# Patient Record
Sex: Female | Born: 1935 | Race: White | Hispanic: No | Marital: Married | Smoking: Never smoker
Health system: Southern US, Community
[De-identification: ages and names within clinical notes are randomized; demographics above are authoritative.]

## PROBLEM LIST (undated history)

## (undated) DIAGNOSIS — E039 Hypothyroidism, unspecified: Secondary | ICD-10-CM

## (undated) DIAGNOSIS — R011 Cardiac murmur, unspecified: Secondary | ICD-10-CM

## (undated) DIAGNOSIS — R002 Palpitations: Secondary | ICD-10-CM

## (undated) DIAGNOSIS — R42 Dizziness and giddiness: Secondary | ICD-10-CM

## (undated) DIAGNOSIS — T7840XA Allergy, unspecified, initial encounter: Secondary | ICD-10-CM

## (undated) DIAGNOSIS — I350 Nonrheumatic aortic (valve) stenosis: Secondary | ICD-10-CM

## (undated) DIAGNOSIS — I2510 Atherosclerotic heart disease of native coronary artery without angina pectoris: Secondary | ICD-10-CM

## (undated) DIAGNOSIS — M1990 Unspecified osteoarthritis, unspecified site: Secondary | ICD-10-CM

## (undated) DIAGNOSIS — M810 Age-related osteoporosis without current pathological fracture: Secondary | ICD-10-CM

## (undated) DIAGNOSIS — F419 Anxiety disorder, unspecified: Secondary | ICD-10-CM

## (undated) DIAGNOSIS — F32A Depression, unspecified: Secondary | ICD-10-CM

## (undated) DIAGNOSIS — I499 Cardiac arrhythmia, unspecified: Secondary | ICD-10-CM

## (undated) DIAGNOSIS — Z9289 Personal history of other medical treatment: Secondary | ICD-10-CM

## (undated) HISTORY — PX: OTHER SURGICAL HISTORY: SHX169

## (undated) HISTORY — DX: Age-related osteoporosis without current pathological fracture: M81.0

## (undated) HISTORY — PX: ABDOMINAL HYSTERECTOMY: SHX81

## (undated) HISTORY — PX: APPENDECTOMY: SHX54

## (undated) HISTORY — DX: Palpitations: R00.2

## (undated) HISTORY — DX: Nonrheumatic aortic (valve) stenosis: I35.0

## (undated) HISTORY — DX: Personal history of other medical treatment: Z92.89

## (undated) HISTORY — DX: Hypothyroidism, unspecified: E03.9

## (undated) HISTORY — DX: Depression, unspecified: F32.A

## (undated) HISTORY — PX: CARDIAC CATHETERIZATION: SHX172

## (undated) HISTORY — PX: TONSILLECTOMY: SUR1361

## (undated) HISTORY — PX: FINGER SURGERY: SHX640

## (undated) HISTORY — PX: CARDIAC VALVE REPLACEMENT: SHX585

## (undated) HISTORY — DX: Anxiety disorder, unspecified: F41.9

## (undated) HISTORY — DX: Allergy, unspecified, initial encounter: T78.40XA

---

## 1998-02-09 ENCOUNTER — Other Ambulatory Visit: Admission: RE | Admit: 1998-02-09 | Discharge: 1998-02-09 | Payer: Self-pay | Admitting: Obstetrics & Gynecology

## 2002-12-23 ENCOUNTER — Encounter: Payer: Self-pay | Admitting: Internal Medicine

## 2002-12-23 ENCOUNTER — Encounter: Admission: RE | Admit: 2002-12-23 | Discharge: 2002-12-23 | Payer: Self-pay | Admitting: Internal Medicine

## 2007-12-18 ENCOUNTER — Encounter: Admission: RE | Admit: 2007-12-18 | Discharge: 2007-12-18 | Payer: Self-pay | Admitting: Orthopedic Surgery

## 2007-12-20 ENCOUNTER — Ambulatory Visit (HOSPITAL_BASED_OUTPATIENT_CLINIC_OR_DEPARTMENT_OTHER): Admission: RE | Admit: 2007-12-20 | Discharge: 2007-12-20 | Payer: Self-pay | Admitting: Orthopedic Surgery

## 2007-12-20 ENCOUNTER — Encounter (INDEPENDENT_AMBULATORY_CARE_PROVIDER_SITE_OTHER): Payer: Self-pay | Admitting: Orthopedic Surgery

## 2009-02-23 ENCOUNTER — Ambulatory Visit: Payer: Self-pay | Admitting: Cardiology

## 2009-02-23 DIAGNOSIS — R002 Palpitations: Secondary | ICD-10-CM | POA: Insufficient documentation

## 2009-02-23 DIAGNOSIS — E039 Hypothyroidism, unspecified: Secondary | ICD-10-CM | POA: Insufficient documentation

## 2009-02-23 DIAGNOSIS — R0602 Shortness of breath: Secondary | ICD-10-CM | POA: Insufficient documentation

## 2009-03-10 ENCOUNTER — Ambulatory Visit: Payer: Self-pay | Admitting: Cardiology

## 2009-03-10 ENCOUNTER — Encounter: Payer: Self-pay | Admitting: Cardiology

## 2009-03-10 ENCOUNTER — Ambulatory Visit: Payer: Self-pay

## 2009-03-10 ENCOUNTER — Ambulatory Visit (HOSPITAL_COMMUNITY): Admission: RE | Admit: 2009-03-10 | Discharge: 2009-03-10 | Payer: Self-pay | Admitting: Cardiology

## 2009-03-20 ENCOUNTER — Telehealth (INDEPENDENT_AMBULATORY_CARE_PROVIDER_SITE_OTHER): Payer: Self-pay | Admitting: *Deleted

## 2009-04-16 ENCOUNTER — Ambulatory Visit: Payer: Self-pay | Admitting: Cardiology

## 2009-04-16 DIAGNOSIS — I359 Nonrheumatic aortic valve disorder, unspecified: Secondary | ICD-10-CM | POA: Insufficient documentation

## 2010-03-30 ENCOUNTER — Encounter: Payer: Self-pay | Admitting: Cardiology

## 2010-03-30 ENCOUNTER — Ambulatory Visit: Payer: Self-pay | Admitting: Cardiology

## 2010-04-09 ENCOUNTER — Ambulatory Visit: Payer: Self-pay

## 2010-04-09 ENCOUNTER — Ambulatory Visit (HOSPITAL_COMMUNITY): Admission: RE | Admit: 2010-04-09 | Payer: Self-pay | Source: Home / Self Care | Admitting: Cardiology

## 2010-04-20 ENCOUNTER — Ambulatory Visit (HOSPITAL_COMMUNITY)
Admission: RE | Admit: 2010-04-20 | Discharge: 2010-04-20 | Payer: Self-pay | Source: Home / Self Care | Attending: Cardiology | Admitting: Cardiology

## 2010-04-20 ENCOUNTER — Encounter: Payer: Self-pay | Admitting: Cardiology

## 2010-04-20 ENCOUNTER — Ambulatory Visit: Payer: Self-pay

## 2010-05-27 NOTE — Assessment & Plan Note (Signed)
Summary: F1Y/DM   CC:  1 year ROV.  History of Present Illness: Pleasant female who I saw in November, 2010 for palpitations. An echocardiogram was performed on November 16 of 2010 and showed normal LV function and mild aortic stenosis with a mean gradient of 12 mm of mercury. An event monitor showed sinus rhythm with rare PAC and PVC but she did not have palpitations while wearing the monitor. Since I last saw her in Dec 2010 she has rare palpitations that are described as a brief flutter. They are not sustained. She did not have dyspnea on exertion, chest pain, syncope or pedal edema.  Current Medications (verified): 1)  Omega-3 350 Mg Caps (Omega-3 Fatty Acids) .Marland Kitchen.. 1 Tab By Mouth Two Times A Day 2)  Calcium-Magnesium 500-250 Mg Tabs (Calcium-Magnesium) .Marland Kitchen.. 1 Tab By Mouth Two Times A Day 3)  Glucosamine-Chondroitin   Caps (Glucosamine-Chondroit-Vit C-Mn) .Marland Kitchen.. 1 Tab By Mouth Two Times A Day 4)  Estradiol 0.5 Mg Tabs (Estradiol) .Marland Kitchen.. 1 Tab By Mouth Once Daily 5)  Aspirin 81 Mg  Tabs (Aspirin) .Marland Kitchen.. 1 Tab By Mouth Once Daily 6)  Flaxseed  Misc (Flaxseed) .... Once Daily 7)  Vitamin C  Powd (Ascorbic Acid) .... Daily 8)  Cinnamon Oil  Powder .... Daily 9)  Folic Acid 400 Mcg Tabs (Folic Acid) .Marland Kitchen.. 1 Tab By Mouth Once Daily 10)  Vitamin D 1000 Unit Tabs (Cholecalciferol) .Marland Kitchen.. 1 Tab By Mouth Once Daily 11)  Synthroid 25 Mcg Tabs (Levothyroxine Sodium) .Marland Kitchen.. 1 Tab By Mouth Once Daily 12)  Vitamin E 400 Unit Caps (Vitamin E) .... Take 1 By Mouth Once Daily  Allergies (verified): 1)  ! Sulfa 2)  ! Erythromycin  Past History:  Past Medical History: Hypothyroid Mild aortic stenosis  Past Surgical History: Reviewed history from 02/23/2009 and no changes required. Bilateral arthroscopic knee surgery Prior TAH secondary to fibroids Tonsillectomy Appendecomy  Social History: Reviewed history from 02/23/2009 and no changes required. Retired  Married  Tobacco Use - No.  Alcohol Use -  yes  Review of Systems       no fevers or chills, productive cough, hemoptysis, dysphasia, odynophagia, melena, hematochezia, dysuria, hematuria, rash, seizure activity, orthopnea, PND, pedal edema, claudication. Remaining systems are negative.   Vital Signs:  Patient profile:   75 year old female Height:      63.5 inches Weight:      132 pounds BMI:     23.10 Pulse rate:   72 / minute Pulse rhythm:   regular BP sitting:   122 / 70  (left arm) Cuff size:   regular  Vitals Entered By: [facility] Kidney, EMT-P (March 30, 2010 11:41 AM)  Physical Exam  General:  Well-developed well-nourished in no acute distress.  Skin is warm and dry.  HEENT is normal.  Neck is supple. No thyromegaly.  Chest is clear to auscultation with normal expansion.  Cardiovascular exam is regular rate and rhythm.  Abdominal exam nontender or distended. No masses palpated. Extremities show no edema. neuro grossly intact    Impression & Recommendations:  Problem # 1:  AORTIC VALVE DISORDERS (ICD-424.1)  Plan repeat echocardiogram.  Problem # 2:  UNSPECIFIED HYPOTHYROIDISM (ICD-244.9)  Her updated medication list for this problem includes:    Synthroid 25 Mcg Tabs (Levothyroxine sodium) .Marland Kitchen... 1 tab by mouth once daily  Her updated medication list for this problem includes:    Synthroid 25 Mcg Tabs (Levothyroxine sodium) .Marland Kitchen... 1 tab by mouth once daily  Problem # 3:  PALPITATIONS (ICD-785.1)  Symptoms sound to most likely be PACs or PVCs. They are self-limited. No further evaluation at this point. If they worsen we will plan to repeat her monitor or add a beta blocker. Her updated medication list for this problem includes:    Aspirin 81 Mg Tabs (Aspirin) .Marland Kitchen... 1 tab by mouth once daily  Other Orders: EKG w/ Interpretation (93000) Echocardiogram (Echo)  Patient Instructions: 1)  Your physician recommends that you schedule a follow-up appointment in: YEAR WITH DR CRENSHAW 2)  Your physician  recommends that you continue on your current medications as directed. Please refer to the Current Medication list given to you today. 3)  Your physician has requested that you have an echocardiogram.  Echocardiography is a painless test that uses sound waves to create images of your heart. It provides your doctor with information about the size and shape of your heart and how well your heart's chambers and valves are working.  This procedure takes approximately one hour. There are no restrictions for this procedure.

## 2010-09-07 NOTE — Op Note (Signed)
NAMEDORIE, OHMS                  ACCOUNT NO.:  [id number]   MEDICAL RECORD NO.:  [id number]          PATIENT TYPE:  AMB   LOCATION:  DSC                          FACILITY:  [facility]   PHYSICIAN:  Cindee Salt, M.D.       DATE OF BIRTH:  16-Mar-1936   DATE OF PROCEDURE:  12/20/2007  DATE OF DISCHARGE:                               OPERATIVE REPORT   PREOPERATIVE DIAGNOSIS:  Mucoid cyst, left middle, left ring fingers.   POSTOPERATIVE DIAGNOSIS:  Mucoid cyst, left middle, left ring fingers.   OPERATION:  Excision of mucoid cyst, left middle, left ring finger with  debridement in distal interphalangeal joints, each finger.   SURGEON:  Cindee Salt, MD   ASSISTANT:  Carolyne Fiscal, RN   ANESTHESIA:  Forearm based IV regional.   ANESTHESIOLOGIST:  Zenon Mayo, MD   HISTORY:  The patient is a 75 year old female with a history of mucoid  cyst of her left middle, left ring finger.  She is desirous of having  these removed.  Her postoperative course had been discussed along with  risks and complications and she is aware that there is no guarantee with  the surgery, possibility of infection, recurrence of injury to arteries,  nerves, tendons, incomplete relief of symptoms, dystrophy, and  possibility of recurrence of the cyst as long as degenerative changes  are present within the joint.  She has elected to undergo surgical  excision.  In the preoperative area the patient is seen, the extremity  marked by both the patient and surgeon, and antibiotic given.   PROCEDURE:  The patient was brought to the operating room where a  forearm based IV regional anesthetic was carried out without difficulty.  She was prepped using DuraPrep, supine position, left arm free.  A  curvilinear incision was made over the distal interphalangeal joint of  the left middle finger and carried down through subcutaneous tissue.  Bleeders were electrocauterized.  The dissection carried down to the  joint.  A cyst was  immediately encountered, both radial and ulnarly.  This had been deflated.  This was excised and sent to pathology.  The  wound was then irrigated.  The joint debrided with a small rongeur.  The  wound was then closed with interrupted 5-0 Vicryl Rapide sutures.  The  ring finger was attended to next.  A curvilinear incision was made with  possible rotation of a dorsal flap.  Again, this was carried down  through the subcutaneous tissue.  Bleeders again were electrocauterized.  The extensor tendon was identified and protected.  A cyst was present on  the radial aspect with blunt and sharp dissection.  This was dissected  free maintaining the skin.  The specimen was sent to pathology as was  the cyst and the middle finger.  The wound was irrigated after debriding  the distal interphalangeal joint with a small rongeur.  The extensor  tendon was maintained intact.  The wound was then irrigated with saline.  The skin was then closed with interrupted 5-0 Vicryl Rapide sutures.  Flap was not performed  and that the skin was able to be  closed without difficulty and no tension.  A sterile compressive  dressing and splint to each finger was applied.  The patient tolerated  the procedure well and was taken to the recovery room for observation in  satisfactory condition.  She will be discharged home to return to the  [facility] [place] in 1 week on [place] [address].           ______________________________  Cindee Salt, M.D.     GK/MEDQ  D:  12/20/2007  T:  12/21/2007  Job:  [id number]   cc:   Olene Craven, M.D.

## 2011-06-23 ENCOUNTER — Telehealth: Payer: Self-pay | Admitting: Cardiology

## 2011-06-23 NOTE — Telephone Encounter (Signed)
Spoke with pt, she is having a physical with dr Wylene Simmer and had some questions regarding labs. Questions answered.

## 2011-06-23 NOTE — Telephone Encounter (Signed)
New Msg: Pt calling wanting to speak with nurse with question about heart test.   Pt was also reminded that she is past due to see MD. Pt was informed that letter was sent in October 2012 to remind that she is due to see MD. Pt stated she wanted to speak with nurse prior to scheduling appt to see MD for yearly fu.  Please return pt call to discuss further.

## 2011-08-24 ENCOUNTER — Encounter: Payer: Self-pay | Admitting: *Deleted

## 2011-08-25 ENCOUNTER — Encounter: Payer: Self-pay | Admitting: Cardiology

## 2011-08-25 ENCOUNTER — Ambulatory Visit (INDEPENDENT_AMBULATORY_CARE_PROVIDER_SITE_OTHER): Payer: Medicare Other | Admitting: Cardiology

## 2011-08-25 VITALS — BP 131/82 | HR 67 | Ht 63.0 in | Wt 131.0 lb

## 2011-08-25 DIAGNOSIS — I359 Nonrheumatic aortic valve disorder, unspecified: Secondary | ICD-10-CM

## 2011-08-25 DIAGNOSIS — R079 Chest pain, unspecified: Secondary | ICD-10-CM | POA: Insufficient documentation

## 2011-08-25 NOTE — Progress Notes (Signed)
   HPI: Pleasant female who I initially saw in November 2010 for palpitations. An echocardiogram was performed on November 16 of 2010 and showed normal LV function and mild aortic stenosis with a mean gradient of 12 mm of mercury. An event monitor showed sinus rhythm with rare PAC and PVC but she did not have palpitations while wearing the monitor. Echocardiogram was repeated in December of 2011. Her LV function was normal. There was moderate aortic stenosis with a mean gradient of 27 mm of mercury. There was mild aortic insufficiency. Since I last saw her in Dec 2011 she has some dyspnea on exertion but this is typically with extreme activities. No orthopnea, PND, pedal edema, palpitations or syncope. She has chest tightness when she runs but not with normal activities.  Current Outpatient Prescriptions  Medication Sig Dispense Refill  . Ascorbic Acid (VITAMIN C) POWD Take by mouth as directed.      Marland Kitchen aspirin 81 MG tablet Take 81 mg by mouth daily.      Marland Kitchen CALCIUM PO Take by mouth 2 (two) times daily.      . Cholecalciferol (VITAMIN D PO) Take by mouth as directed.      Marland Kitchen CINNAMON PO Take by mouth as directed.      Marland Kitchen estradiol (ESTRACE) 0.5 MG tablet Take 0.5 mg by mouth daily.      Marland Kitchen GLUCOSAMINE-CHONDROITIN PO Take by mouth 2 (two) times daily. 1200-1500 mg      . Levothyroxine Sodium (SYNTHROID PO) Take by mouth daily. .05      . MAGNESIUM PO Take by mouth 2 (two) times daily.      . Methylsulfonylmethane (MSM) 1000 MG CAPS Take by mouth 2 (two) times daily.      . Multiple Vitamins-Minerals (BIO-35 IRON FREE PO) Take by mouth. 75 mg      . Omega-3 Fatty Acids (FISH OIL PO) Take by mouth 2 (two) times daily.      Marland Kitchen VITAMIN E PO Take by mouth. 400 IU         Past Medical History  Diagnosis Date  . Unspecified hypothyroidism   . Aortic stenosis   . Palpitations     No past surgical history on file.  History   Social History  . Marital Status: Married    Spouse Name: N/A    Number  of Children: N/A  . Years of Education: N/A   Occupational History  . Not on file.   Social History Main Topics  . Smoking status: Never Smoker   . Smokeless tobacco: Not on file  . Alcohol Use: Not on file  . Drug Use: Not on file  . Sexually Active: Not on file   Other Topics Concern  . Not on file   Social History Narrative  . No narrative on file    ROS: Arthralgias but no fevers or chills, productive cough, hemoptysis, dysphasia, odynophagia, melena, hematochezia, dysuria, hematuria, rash, seizure activity, orthopnea, PND, pedal edema, claudication. Remaining systems are negative.  Physical Exam: Well-developed well-nourished in no acute distress.  Skin is warm and dry.  HEENT is normal.  Neck is supple.  Chest is clear to auscultation with normal expansion.  Cardiovascular exam is regular rate and rhythm. 2/6 systolic murmur left sternal border. S2 is not diminished. Abdominal exam nontender or distended. No masses palpated. Extremities show no edema. neuro grossly intact  ECG normal sinus rhythm at a rate of 67. Left axis deviation.

## 2011-08-25 NOTE — Assessment & Plan Note (Signed)
Plan repeat echocardiogram. I have explained that she will most likely require aortic valve replacement in the future.

## 2011-08-25 NOTE — Assessment & Plan Note (Signed)
Controlled at present.  

## 2011-08-25 NOTE — Assessment & Plan Note (Signed)
Patient has some chest tightness but only with running. Schedule Myoview for risk stratification.

## 2011-08-25 NOTE — Patient Instructions (Signed)
Your physician wants you to follow-up in: ONE YEAR WITH DR CRENSHAW You will receive a reminder letter in the mail two months in advance. If you don't receive a letter, please call our office to schedule the follow-up appointment.   Your physician has requested that you have en exercise stress myoview. For further information please visit [email or web address]. Please follow instruction sheet, as given.   Your physician has requested that you have an echocardiogram. Echocardiography is a painless test that uses sound waves to create images of your heart. It provides your doctor with information about the size and shape of your heart and how well your heart's chambers and valves are working. This procedure takes approximately one hour. There are no restrictions for this procedure.   

## 2011-09-05 ENCOUNTER — Ambulatory Visit (HOSPITAL_COMMUNITY): Payer: Medicare Other | Attending: Cardiology

## 2011-09-05 ENCOUNTER — Other Ambulatory Visit: Payer: Self-pay

## 2011-09-05 DIAGNOSIS — I359 Nonrheumatic aortic valve disorder, unspecified: Secondary | ICD-10-CM

## 2011-09-06 ENCOUNTER — Ambulatory Visit (HOSPITAL_COMMUNITY): Payer: Medicare Other | Attending: Cardiology | Admitting: Radiology

## 2011-09-06 DIAGNOSIS — R0989 Other specified symptoms and signs involving the circulatory and respiratory systems: Secondary | ICD-10-CM | POA: Insufficient documentation

## 2011-09-06 DIAGNOSIS — R002 Palpitations: Secondary | ICD-10-CM | POA: Insufficient documentation

## 2011-09-06 DIAGNOSIS — R079 Chest pain, unspecified: Secondary | ICD-10-CM

## 2011-09-06 DIAGNOSIS — R0789 Other chest pain: Secondary | ICD-10-CM | POA: Insufficient documentation

## 2011-09-06 DIAGNOSIS — R0609 Other forms of dyspnea: Secondary | ICD-10-CM | POA: Insufficient documentation

## 2011-09-06 DIAGNOSIS — Z8249 Family history of ischemic heart disease and other diseases of the circulatory system: Secondary | ICD-10-CM | POA: Insufficient documentation

## 2011-09-06 DIAGNOSIS — R0602 Shortness of breath: Secondary | ICD-10-CM

## 2011-09-06 DIAGNOSIS — R000 Tachycardia, unspecified: Secondary | ICD-10-CM | POA: Insufficient documentation

## 2011-09-06 MED ORDER — TECHNETIUM TC 99M TETROFOSMIN IV KIT
33.0000 | PACK | Freq: Once | INTRAVENOUS | Status: AC | PRN
  Administered 2011-09-06: 33 via INTRAVENOUS

## 2011-09-06 MED ORDER — TECHNETIUM TC 99M TETROFOSMIN IV KIT
11.0000 | PACK | Freq: Once | INTRAVENOUS | Status: AC | PRN
  Administered 2011-09-06: 11 via INTRAVENOUS

## 2011-09-06 MED ORDER — REGADENOSON 0.4 MG/5ML IV SOLN
0.4000 mg | Freq: Once | INTRAVENOUS | Status: AC
  Administered 2011-09-06: 0.4 mg via INTRAVENOUS

## 2011-09-06 NOTE — Progress Notes (Signed)
[facility] SITE 3 NUCLEAR MED [address] [place] [address] [phone]  Cardiology Nuclear Med Study  Linda Parker is a 76 y.o. female     MRN : [id number]     DOB: 07-08-1935  Procedure Date: 09/06/2011  Nuclear Med Background Indication for Stress Test:  Evaluation for Ischemia History:  09/05/11 Echo:EF=55-60%, moderate>severe AS, mild AI. Cardiac Risk Factors: Family History - CAD  Symptoms:  Chest Tightness with Exertion (last episode of chest discomfort was about 2-weeks ago), DOE, Palpitations and Rapid HR   Nuclear Pre-Procedure Caffeine/Decaff Intake:  None> 12 hrs NPO After: 6:30pm   Lungs:  clear O2 Sat: 99% on room air. IV 0.9% NS with Angio Cath:  20g  IV Site: R Antecubital x 1, tolerated well IV Started by:  Irean Hong, RN  Chest Size (in):  36 Cup Size: C  Height: 5\' 3"  (1.6 m)  Weight:  129 lb (58.514 kg)  BMI:  Body mass index is 22.85 kg/(m^2). Tech Comments:  n/a    Nuclear Med Study 1 or 2 day study: 1 day  Stress Test Type:  Treadmill/Lexiscan  Reading MD: Marca Ancona, MD  Order Authorizing Provider:  Olga Millers, MD  Resting Radionuclide: Technetium 82m Tetrofosmin  Resting Radionuclide Dose: 11.0 mCi   Stress Radionuclide:  Technetium 81m Tetrofosmin  Stress Radionuclide Dose: 32.6 mCi           Stress Protocol Rest HR: 73 Stress HR: 121  Rest BP: 129/75 Stress BP: 157/81  Exercise Time (min): 2:00 METS: n/a   Predicted Max HR: 147 bpm % Max HR: 82.31 bpm Rate Pressure Product: 86578   Dose of Adenosine (mg):  n/a Dose of Lexiscan: 0.4 mg  Dose of Atropine (mg): n/a Dose of Dobutamine: n/a mcg/kg/min (at max HR)  Stress Test Technologist: Smiley Houseman, CMA-N  Nuclear Technologist:  Domenic Polite, CNMT     Rest Procedure:  Myocardial perfusion imaging was performed at rest 45 minutes following the intravenous administration of Technetium 96m Tetrofosmin.  Rest ECG: No acute changes  Stress Procedure:   The patient received IV Lexiscan 0.4 mg over 15-seconds with concurrent low level exercise and then Technetium 16m Tetrofosmin was injected at 30-seconds while the patient continued walking one more minute. There were no significant changes with Lexiscan bolus, rare PAC noted. Quantitative spect images were obtained after a 45-minute delay.  Stress ECG: No significant change from baseline ECG  QPS Raw Data Images:  Normal; no motion artifact; normal heart/lung ratio. Stress Images:  Normal homogeneous uptake in all areas of the myocardium. Rest Images:  Normal homogeneous uptake in all areas of the myocardium. Subtraction (SDS):  There is no evidence of scar or ischemia. Transient Ischemic Dilatation (Normal <1.22):  1.02 Lung/Heart Ratio (Normal <0.45):  0.26  Quantitative Gated Spect Images QGS EDV:  64 ml QGS ESV:  14 ml  Impression Exercise Capacity:  Lexiscan with low level exercise. BP Response:  Normal blood pressure response. Clinical Symptoms:  Nausea, warmth ECG Impression:  No significant ST segment change suggestive of ischemia. Comparison with Prior Nuclear Study: No previous nuclear study performed  Overall Impression:  Normal stress nuclear study.  LV Ejection Fraction: 77%.  LV Wall Motion:  NL LV Function; NL Wall Motion  [employer]

## 2012-01-12 ENCOUNTER — Telehealth: Payer: Self-pay | Admitting: Cardiology

## 2012-01-12 NOTE — Telephone Encounter (Signed)
plz return call to patient at hm#  Regarding stinging sensation behind left ear, running from jawbone down to her neck.  No SOB, pt just concerned and annoyed wants to know if this could be connected to Carotid Artery.

## 2012-01-12 NOTE — Telephone Encounter (Signed)
Patient states every day for one  week she has been having a sting sensation  behind her left ear running to jaw line down to left side of the neck, from the time she wakes up to the time she goes to bed. Pt denies SOB or chest pain. Pt has not had these symptoms before. Pt was afraid that it could be her carotid. Pt was made aware of carotid  symptoms . Pt will call her PCP for an appointment to be seen.

## 2012-01-12 NOTE — Telephone Encounter (Signed)
error 

## 2012-04-16 ENCOUNTER — Telehealth: Payer: Self-pay | Admitting: Cardiology

## 2012-04-16 NOTE — Telephone Encounter (Signed)
Linda Parker states her orthopedic physician recommended meloxicam.  She wants to make sure that it is ok with Linda Parker for her to take it before she starts.

## 2012-04-16 NOTE — Telephone Encounter (Signed)
Ok for meloxicam Linda Parker

## 2012-04-16 NOTE — Telephone Encounter (Signed)
Pt is wanting to take meloxicam but wants to make sure it is ok for her to take it

## 2012-04-23 NOTE — Telephone Encounter (Signed)
Spoke with pt, Aware of dr Ludwig Clarks recommendations. She also questioned about follow up labs to check liver and kidney function with this med, explained to pt that was the responsibility of the provider giving her the med. She voiced understanding and will discuss with ortho.

## 2012-09-04 ENCOUNTER — Other Ambulatory Visit (HOSPITAL_COMMUNITY): Payer: Self-pay | Admitting: Cardiology

## 2012-09-04 DIAGNOSIS — I359 Nonrheumatic aortic valve disorder, unspecified: Secondary | ICD-10-CM

## 2012-09-05 ENCOUNTER — Ambulatory Visit (HOSPITAL_COMMUNITY): Payer: Medicare Other | Attending: Cardiovascular Disease | Admitting: Radiology

## 2012-09-05 ENCOUNTER — Ambulatory Visit (INDEPENDENT_AMBULATORY_CARE_PROVIDER_SITE_OTHER): Payer: Medicare Other | Admitting: Cardiology

## 2012-09-05 ENCOUNTER — Encounter: Payer: Self-pay | Admitting: Cardiology

## 2012-09-05 VITALS — BP 128/76 | HR 61 | Wt 131.0 lb

## 2012-09-05 DIAGNOSIS — I080 Rheumatic disorders of both mitral and aortic valves: Secondary | ICD-10-CM | POA: Insufficient documentation

## 2012-09-05 DIAGNOSIS — I359 Nonrheumatic aortic valve disorder, unspecified: Secondary | ICD-10-CM

## 2012-09-05 DIAGNOSIS — I379 Nonrheumatic pulmonary valve disorder, unspecified: Secondary | ICD-10-CM | POA: Insufficient documentation

## 2012-09-05 DIAGNOSIS — R002 Palpitations: Secondary | ICD-10-CM

## 2012-09-05 DIAGNOSIS — I079 Rheumatic tricuspid valve disease, unspecified: Secondary | ICD-10-CM | POA: Insufficient documentation

## 2012-09-05 NOTE — Progress Notes (Signed)
   HPI: Pleasant female who I initially saw in November 2010 for palpitations for fu of AS. An event monitor showed sinus rhythm with rare PAC and PVC but she did not have palpitations while wearing the monitor. Nuclear study in May of 2013 showed an ejection fraction of 77% and normal perfusion. Echocardiogram in may of 2013 showed normal LV function, grade 1 diastolic dysfunction and moderate to severe aortic stenosis with a mean gradient of 32 mm of mercury. There was mild aortic insufficiency. Since I last saw her, she denies dyspnea on exertion or syncope. She occasionally feels a brief stinging chest pain for seconds but denies exertional chest pain.   Current Outpatient Prescriptions  Medication Sig Dispense Refill  . Ascorbic Acid (VITAMIN C) POWD Take by mouth daily.       Marland Kitchen aspirin 81 MG tablet Take 81 mg by mouth daily.      Marland Kitchen CALCIUM PO Take by mouth 2 (two) times daily.      . Cholecalciferol (VITAMIN D PO) Take 1 tablet by mouth daily.       Marland Kitchen CINNAMON PO Take by mouth daily.       Marland Kitchen estradiol (ESTRACE) 0.5 MG tablet Take 0.5 mg by mouth daily.      Marland Kitchen GLUCOSAMINE-CHONDROITIN PO Take by mouth 2 (two) times daily. 1200-1500 mg      . Levothyroxine Sodium (SYNTHROID PO) Take 1 tablet by mouth daily.       Marland Kitchen MAGNESIUM PO Take by mouth 2 (two) times daily.      . Methylsulfonylmethane (MSM) 1000 MG CAPS Take by mouth 2 (two) times daily.      . Omega-3 Fatty Acids (FISH OIL PO) Take by mouth 2 (two) times daily.      Marland Kitchen VITAMIN E PO Take 1 tablet by mouth daily.        No current facility-administered medications for this visit.     Past Medical History  Diagnosis Date  . Unspecified hypothyroidism   . Aortic stenosis   . Palpitations     History reviewed. No pertinent past surgical history.  History   Social History  . Marital Status: Married    Spouse Name: N/A    Number of Children: N/A  . Years of Education: N/A   Occupational History  . Not on file.   Social  History Main Topics  . Smoking status: Never Smoker   . Smokeless tobacco: Not on file  . Alcohol Use: Not on file  . Drug Use: Not on file  . Sexually Active: Not on file   Other Topics Concern  . Not on file   Social History Narrative  . No narrative on file    ROS: no fevers or chills, productive cough, hemoptysis, dysphasia, odynophagia, melena, hematochezia, dysuria, hematuria, rash, seizure activity, orthopnea, PND, pedal edema, claudication. Remaining systems are negative.  Physical Exam: Well-developed well-nourished in no acute distress.  Skin is warm and dry.  HEENT is normal.  Neck is supple.  Chest is clear to auscultation with normal expansion.  Cardiovascular exam is regular rate and rhythm. 2/6 systolic murmur left sternal border. S2 is not. Abdominal exam nontender or distended. No masses palpated. Extremities show no edema. neuro grossly intact  ECG sinus rhythm at a rate of 61. Left axis deviation. RV conduction delay.

## 2012-09-05 NOTE — Patient Instructions (Addendum)
Your physician wants you to follow-up in: ONE YEAR WITH DR CRENSHAW You will receive a reminder letter in the mail two months in advance. If you don't receive a letter, please call our office to schedule the follow-up appointment.  

## 2012-09-05 NOTE — Assessment & Plan Note (Signed)
Plan to await echocardiogram results performed today. Previous echo showed moderate to severe aortic stenosis. She is not having symptoms. She understands she may well need aortic valve replacement in the future.

## 2012-09-05 NOTE — Progress Notes (Signed)
Echocardiogram performed.  

## 2012-09-05 NOTE — Assessment & Plan Note (Signed)
Controlled at present.  

## 2012-12-03 ENCOUNTER — Telehealth: Payer: Self-pay | Admitting: Cardiology

## 2012-12-03 NOTE — Telephone Encounter (Signed)
[facility] pre-operative clearance request  Ok for surgery. Echo 5.14.14. Mod-severe AS; mean gradiant 37 -B Crenshaw

## 2013-02-05 ENCOUNTER — Other Ambulatory Visit: Payer: Self-pay | Admitting: Orthopedic Surgery

## 2013-02-15 ENCOUNTER — Encounter (HOSPITAL_COMMUNITY): Payer: Self-pay | Admitting: Pharmacy Technician

## 2013-02-18 ENCOUNTER — Other Ambulatory Visit (HOSPITAL_COMMUNITY): Payer: Self-pay | Admitting: *Deleted

## 2013-02-18 NOTE — Patient Instructions (Addendum)
20      Your procedure is scheduled on:  Monday 03/04/2013  Report to [facility] at 0515  AM.  Call this number if you have problems the night before or morning of surgery: [phone]   Remember:             IF YOU USE CPAP,BRING MASK AND TUBING AM OF SURGERY!   Do not eat food or drink liquids AFTER MIDNIGHT!  Take these medicines the morning of surgery with A SIP OF WATER: Synthroid   Do not bring valuables to the hospital. [facility] IS NOT RESPONSIBLE  FOR ANY BELONGINGS OR VALUABLES BROUGHT TO HOSPITAL.  Marland Kitchen  Leave suitcase in the car. After surgery it may be brought to your room.  For patients admitted to the hospital, checkout time is 11:00 AM the day of              Discharge.    DO NOT WEAR JEWELRY , MAKE-UP, LOTIONS,POWDERS,PERFUMES!             WOMEN -DO NOT SHAVE LEGS OR UNDERARMS 12 HRS. BEFORE SURGERY!               MEN MAY SHAVE AS USUAL!             CONTACTS,DENTURES OR BRIDGEWORK, FALSE EYELASHES MAY  NOT BE WORN INTO SURGERY!                                           Patients discharged the day of surgery will not be allowed to drive home.  If going home the same day of surgery, must have someone stay with you  first 24 hrs.at home and arrange for someone to drive you home from the  Hospital.                          YOUR DRIVER IS: N/A   Special Instructions:             Please read over the following fact sheets that you were given:             1. [facility] PREPARING FOR SURGERY SHEET              2.[facility]                                        [employer] Geissinger,RN,BSN     [phone]             FAILURE TO FOLLOW THESE INSTRUCTIONS MAY RESULT IN CANCELLATION OF YOUR SURGERY!               Patient Signature:___________________________

## 2013-02-19 ENCOUNTER — Encounter (HOSPITAL_COMMUNITY)
Admission: RE | Admit: 2013-02-19 | Discharge: 2013-02-19 | Disposition: A | Discharge status: Home / Self Care | Payer: Medicare Other | Source: Ambulatory Visit | Attending: Orthopedic Surgery | Admitting: Orthopedic Surgery

## 2013-02-19 ENCOUNTER — Encounter (HOSPITAL_COMMUNITY): Payer: Self-pay

## 2013-02-19 DIAGNOSIS — Z01812 Encounter for preprocedural laboratory examination: Secondary | ICD-10-CM | POA: Insufficient documentation

## 2013-02-19 HISTORY — DX: Cardiac arrhythmia, unspecified: I49.9

## 2013-02-19 HISTORY — DX: Unspecified osteoarthritis, unspecified site: M19.90

## 2013-02-19 LAB — URINALYSIS, ROUTINE W REFLEX MICROSCOPIC
Bilirubin Urine: NEGATIVE
Glucose, UA: NEGATIVE mg/dL
Hgb urine dipstick: NEGATIVE
Leukocytes, UA: NEGATIVE
Specific Gravity, Urine: 1.016 (ref 1.005–1.030)
Urobilinogen, UA: 0.2 mg/dL (ref 0.0–1.0)
pH: 7 (ref 5.0–8.0)

## 2013-02-19 LAB — COMPREHENSIVE METABOLIC PANEL
ALT: 13 U/L (ref 0–35)
Albumin: 4.3 g/dL (ref 3.5–5.2)
Alkaline Phosphatase: 88 U/L (ref 39–117)
CO2: 27 mEq/L (ref 19–32)
Chloride: 103 mEq/L (ref 96–112)
Potassium: 4.2 mEq/L (ref 3.5–5.1)
Sodium: 138 mEq/L (ref 135–145)
Total Protein: 7.9 g/dL (ref 6.0–8.3)

## 2013-02-19 LAB — CBC
MCHC: 33.5 g/dL (ref 30.0–36.0)
MCV: 96.9 fL (ref 78.0–100.0)
RBC: 4.25 MIL/uL (ref 3.87–5.11)
RDW: 13.2 % (ref 11.5–15.5)

## 2013-02-19 LAB — APTT: aPTT: 31 seconds (ref 24–37)

## 2013-02-19 LAB — PROTIME-INR: Prothrombin Time: 12.2 seconds (ref 11.6–15.2)

## 2013-03-03 ENCOUNTER — Encounter (HOSPITAL_COMMUNITY): Payer: Self-pay | Admitting: Anesthesiology

## 2013-03-03 ENCOUNTER — Other Ambulatory Visit: Payer: Self-pay | Admitting: Orthopedic Surgery

## 2013-03-03 NOTE — H&P (Signed)
Linda Parker. Christell Constant  -   Please Note - The patient goes be "Linda Parker", not [name].  DOB: 1935-12-23 Married / Language: English / Race: White Female Date of Admission:  03/04/2013  Chief Complaint:  Right greater than Left Knee Pain  History of Present Illness The patient is a 77 year old female who comes in for a preoperative History and Physical. The patient is scheduled for a right total knee arthroplasty to be performed by Dr. Gus Rankin. Aluisio, MD at [facility] on 03/04/2013. The patient is a 77 year old female who presents today for follow up of their knee. The patient is being followed for their right knee pain and osteoarthritis. Symptoms reported today include: pain and swelling. The patient feels that they are doing poorly and report their pain level to be moderate to severe. Current treatment includes: NSAIDs (aleve). The patient presents following post Synvisc series (No improvement, actually seems worse w/ swelling and pain). Unfortunately the Synvisc did not help. She says for the past 6-7 months, the knee has essentially been taking over her life and preventing her from doing things she wants to do. It bothered her before, but is much worse now. She had the MRI which showed degenerative change and a meniscal tear. She is not having mechanical symptoms, but having more pain and swelling.  She is at a a ponit where she would like something done.  She is ready tor proceed with knee repalcement.  She would also like to get a cortisone injection into the opposite knee at the same time. They have been treated conservatively in the past for the above stated problem and despite conservative measures, they continue to have progressive pain and severe functional limitations and dysfunction. They have failed non-operative management including home exercise, medications, and injections. It is felt that they would benefit from undergoing total joint replacement. Risks and benefits of the  procedure have been discussed with the patient and they elect to proceed with surgery. There are no active contraindications to surgery such as ongoing infection or rapidly progressive neurological disease.  Problem List Osteoarthritis, knee (715.96)    Allergies Codeine Phosphate *ANALGESICS - OPIOID* Erythromycin *MACROLIDES*. all mycin drugs Sulfanilamide *CHEMICALS*    Family History Congestive Heart Failure. mother Hypertension. child Rheumatoid Arthritis. mother Osteoarthritis. First Degree Relatives. mother and sister Cancer. father    Social History Tobacco use. Never smoker. never smoker Current work status. retired Financial planner (Currently). no Drug/Alcohol Rehab (Previously). no Children. 3 Alcohol use. current drinker; drinks wine; only occasionally per week Pain Contract. no Tobacco / smoke exposure. no Exercise. does running / walking Number of flights of stairs before winded. 2-3 Illicit drug use. no Living situation. live with spouse Marital status. married    Medication History Estradiol Valerate ( Intramuscular) Specific dose unknown - Active. Synthroid ( Oral) Specific dose unknown - Active. (.05mg ) aleve ( as needed) Active. Aspirin 81mg  Active.   Past Surgical History Arthroscopy of Knee. bilateral Hysterectomy. complete (non-cancerous)   Medical History Osteoarthritis Vertigo Heart murmur Valvular heart disease. Aortic Stenosis, moderate to severe (per Crenshaw) Hypothyroidism Osteopenia Menopause    Review of Systems General:Not Present- Chills, Fever, Night Sweats, Fatigue, Weight Gain, Weight Loss and Memory Loss. Skin:Not Present- Hives, Itching, Rash, Eczema and Lesions. HEENT:Not Present- Tinnitus, Headache, Double Vision, Visual Loss, Hearing Loss and Dentures. Respiratory:Not Present- Shortness of breath with exertion, Shortness of breath at rest, Allergies, Coughing up  blood and Chronic Cough. Cardiovascular:Not Present- Chest Pain, Racing/skipping  heartbeats, Difficulty Breathing Lying Down, Murmur, Swelling and Palpitations. Gastrointestinal:Not Present- Bloody Stool, Heartburn, Abdominal Pain, Vomiting, Nausea, Constipation, Diarrhea, Difficulty Swallowing, Jaundice and Loss of appetitie. Female Genitourinary:Not Present- Blood in Urine, Urinary frequency, Weak urinary stream, Discharge, Flank Pain, Incontinence, Painful Urination, Urgency, Urinary Retention and Urinating at Night. Musculoskeletal:Present- Joint Swelling, Joint Pain and Morning Stiffness. Not Present- Muscle Weakness, Muscle Pain, Back Pain and Spasms. Neurological:Not Present- Tremor, Dizziness, Blackout spells, Paralysis, Difficulty with balance and Weakness. Psychiatric:Not Present- Insomnia.    Vitals Weight: 129 lb Height: 63 in Body Surface Area: 1.61 m Body Mass Index: 22.85 kg/m Pulse: 76 (Regular) Resp.: 16 (Unlabored) BP: 114/64 (Sitting, Right Arm, Standard)     Physical Exam The physical exam findings are as follows:   General Mental Status - Alert, cooperative and good historian. General Appearance- pleasant. Not in acute distress. Orientation- Oriented X3. Build & Nutrition- Well nourished and Well developed.   Head and Neck Head- normocephalic, atraumatic . Neck Global Assessment- supple. no bruit auscultated on the right and no bruit auscultated on the left.   Eye Pupil- Bilateral- Regular and Round. Motion- Bilateral- EOMI.   Chest and Lung Exam Auscultation: Breath sounds:- clear at anterior chest wall and - clear at posterior chest wall. Adventitious sounds:- No Adventitious sounds.   Cardiovascular Auscultation:Rhythm- Regular rate and rhythm. Heart Sounds- S1 WNL and S2 WNL. Murmurs & Other Heart Sounds: Murmur 1:Location- Aortic Area and Pulmonic Area. Timing- Holosystolic. Grade- III/VI.  Character- Harsh.   Abdomen Palpation/Percussion:Tenderness- Abdomen is non-tender to palpation. Rigidity (guarding)- Abdomen is soft. Auscultation:Auscultation of the abdomen reveals - Bowel sounds normal.   Female Genitourinary  Not done, not pertinent to present illness  Musculoskeletal She is alert and oriented in no apparent distress. Her right knee shows a tiny effusion. There is marked crepitus on range of motion of the knee. She is tender lateral greater than medial. Slight valgus deformity. No instability. Her left knee no effusion. Moderate crepitus on range of motion. Range 5 to 120. No specific tenderness and no instability.   RADIOGRAPHS: X-rays show the tibial subluxation and the bone on bone changes, patellofemoral and medial as well as the lesser extent lateral. She had the MRI also showing the degeneration.   Assessment & Plan Osteoarthritis, knee (715.96) Impression: Right Knee and Left Knee  Note: Plan is for a Right Total Knee Replacement and a Left Knee Cortisone Injection by Dr. Lequita Halt.  Plan is to go to home.  PCP - Dr. Wylene Simmer - Patient has been seen preoperatively and felt to be stable for surgery. Cardiology - Dr. Jens Som - Patient has been seen preoperatively and felt to be stable for surgery.  The patient does not have any contraindications and will receive TXA (tranexamic acid) prior to surgery.  Please Note - The patient goes be "Linda Parker", not [name].  Signed electronically by Lauraine Rinne, III PA-C

## 2013-03-03 NOTE — Anesthesia Preprocedure Evaluation (Addendum)
Anesthesia Evaluation  Patient identified by MRN, date of birth, ID band Patient awake    Reviewed: Allergy & Precautions, H&P , NPO status , Patient's Chart, lab work & pertinent test results  Airway Mallampati: II TM Distance: >3 FB Neck ROM: Full    Dental no notable dental hx.    Pulmonary shortness of breath,  breath sounds clear to auscultation  Pulmonary exam normal       Cardiovascular Exercise Tolerance: Good negative cardio ROS  + dysrhythmias + Valvular Problems/Murmurs AS Rhythm:Regular Rate:Normal  ECG: Normal.  ECHO: moderate to severe aortic stenosis 2013   Neuro/Psych negative neurological ROS  negative psych ROS   GI/Hepatic negative GI ROS, Neg liver ROS,   Endo/Other  Hypothyroidism   Renal/GU negative Renal ROS  negative genitourinary   Musculoskeletal negative musculoskeletal ROS (+)   Abdominal   Peds negative pediatric ROS (+)  Hematology negative hematology ROS (+)   Anesthesia Other Findings   Reproductive/Obstetrics negative OB ROS                         Anesthesia Physical Anesthesia Plan  ASA: II  Anesthesia Plan: General   Post-op Pain Management:    Induction: Intravenous  Airway Management Planned: Oral ETT  Additional Equipment:   Intra-op Plan:   Post-operative Plan: Extubation in OR  Informed Consent: I have reviewed the patients History and Physical, chart, labs and discussed the procedure including the risks, benefits and alternatives for the proposed anesthesia with the patient or authorized representative who has indicated his/her understanding and acceptance.   Dental advisory given  Plan Discussed with: CRNA  Anesthesia Plan Comments: (General versus spinal. Moderate to severe aortic stenosis in 2013. Plan general.)       Anesthesia Quick Evaluation

## 2013-03-04 ENCOUNTER — Encounter (HOSPITAL_COMMUNITY): Payer: Self-pay | Admitting: *Deleted

## 2013-03-04 ENCOUNTER — Inpatient Hospital Stay (HOSPITAL_COMMUNITY)
Admission: RE | Admit: 2013-03-04 | Discharge: 2013-03-06 | DRG: 470 | Disposition: A | Discharge status: Home Health | Payer: Medicare Other | Source: Ambulatory Visit | Attending: Orthopedic Surgery | Admitting: Orthopedic Surgery

## 2013-03-04 ENCOUNTER — Encounter (HOSPITAL_COMMUNITY)
Admission: RE | Disposition: A | Discharge status: Home Health | Payer: Self-pay | Source: Ambulatory Visit | Attending: Orthopedic Surgery

## 2013-03-04 ENCOUNTER — Encounter (HOSPITAL_COMMUNITY): Payer: Medicare Other | Admitting: Anesthesiology

## 2013-03-04 ENCOUNTER — Inpatient Hospital Stay (HOSPITAL_COMMUNITY): Payer: Medicare Other | Admitting: Anesthesiology

## 2013-03-04 DIAGNOSIS — IMO0002 Reserved for concepts with insufficient information to code with codable children: Secondary | ICD-10-CM | POA: Diagnosis present

## 2013-03-04 DIAGNOSIS — M1710 Unilateral primary osteoarthritis, unspecified knee: Principal | ICD-10-CM | POA: Diagnosis present

## 2013-03-04 DIAGNOSIS — M899 Disorder of bone, unspecified: Secondary | ICD-10-CM | POA: Diagnosis present

## 2013-03-04 DIAGNOSIS — E039 Hypothyroidism, unspecified: Secondary | ICD-10-CM | POA: Diagnosis present

## 2013-03-04 DIAGNOSIS — Z01812 Encounter for preprocedural laboratory examination: Secondary | ICD-10-CM

## 2013-03-04 DIAGNOSIS — R011 Cardiac murmur, unspecified: Secondary | ICD-10-CM | POA: Diagnosis present

## 2013-03-04 DIAGNOSIS — Z7982 Long term (current) use of aspirin: Secondary | ICD-10-CM

## 2013-03-04 DIAGNOSIS — D62 Acute posthemorrhagic anemia: Secondary | ICD-10-CM

## 2013-03-04 DIAGNOSIS — Z96651 Presence of right artificial knee joint: Secondary | ICD-10-CM

## 2013-03-04 DIAGNOSIS — Z79899 Other long term (current) drug therapy: Secondary | ICD-10-CM

## 2013-03-04 DIAGNOSIS — M179 Osteoarthritis of knee, unspecified: Secondary | ICD-10-CM | POA: Diagnosis present

## 2013-03-04 DIAGNOSIS — X58XXXA Exposure to other specified factors, initial encounter: Secondary | ICD-10-CM | POA: Diagnosis present

## 2013-03-04 DIAGNOSIS — I359 Nonrheumatic aortic valve disorder, unspecified: Secondary | ICD-10-CM | POA: Diagnosis present

## 2013-03-04 HISTORY — PX: TOTAL KNEE ARTHROPLASTY: SHX125

## 2013-03-04 HISTORY — PX: INJECTION KNEE: SHX2446

## 2013-03-04 LAB — ABO/RH: ABO/RH(D): O NEG

## 2013-03-04 LAB — TYPE AND SCREEN: Antibody Screen: NEGATIVE

## 2013-03-04 SURGERY — ARTHROPLASTY, KNEE, TOTAL
Anesthesia: General | Site: Knee | Laterality: Right | Wound class: Clean

## 2013-03-04 MED ORDER — TRANEXAMIC ACID 100 MG/ML IV SOLN
1000.0000 mg | INTRAVENOUS | Status: AC
  Administered 2013-03-04: 1000 mg via INTRAVENOUS
  Filled 2013-03-04: qty 10

## 2013-03-04 MED ORDER — METHOCARBAMOL 100 MG/ML IJ SOLN
500.0000 mg | Freq: Four times a day (QID) | INTRAMUSCULAR | Status: DC | PRN
  Administered 2013-03-04: 500 mg via INTRAVENOUS
  Filled 2013-03-04: qty 5

## 2013-03-04 MED ORDER — ACETAMINOPHEN 10 MG/ML IV SOLN
1000.0000 mg | Freq: Once | INTRAVENOUS | Status: AC
  Administered 2013-03-04: 1000 mg via INTRAVENOUS
  Filled 2013-03-04: qty 100

## 2013-03-04 MED ORDER — MORPHINE SULFATE 2 MG/ML IJ SOLN: 1.0000 mg | INTRAMUSCULAR | Status: DC | PRN

## 2013-03-04 MED ORDER — METHYLPREDNISOLONE ACETATE 40 MG/ML IJ SUSP
INTRAMUSCULAR | Status: AC
  Filled 2013-03-04: qty 2

## 2013-03-04 MED ORDER — MEPERIDINE HCL 50 MG/ML IJ SOLN
INTRAMUSCULAR | Status: AC
  Filled 2013-03-04: qty 1

## 2013-03-04 MED ORDER — SODIUM CHLORIDE 0.9 % IJ SOLN
INTRAMUSCULAR | Status: AC
  Filled 2013-03-04: qty 50

## 2013-03-04 MED ORDER — CEFAZOLIN SODIUM-DEXTROSE 2-3 GM-% IV SOLR
INTRAVENOUS | Status: AC
  Filled 2013-03-04: qty 50

## 2013-03-04 MED ORDER — SUCCINYLCHOLINE CHLORIDE 20 MG/ML IJ SOLN
INTRAMUSCULAR | Status: DC | PRN
  Administered 2013-03-04: 100 mg via INTRAVENOUS

## 2013-03-04 MED ORDER — NEOSTIGMINE METHYLSULFATE 1 MG/ML IJ SOLN
INTRAMUSCULAR | Status: DC | PRN
  Administered 2013-03-04: 5 mg via INTRAVENOUS

## 2013-03-04 MED ORDER — DEXTROSE-NACL 5-0.9 % IV SOLN
INTRAVENOUS | Status: DC
  Administered 2013-03-04 (×2): via INTRAVENOUS

## 2013-03-04 MED ORDER — ONDANSETRON HCL 4 MG/2ML IJ SOLN
INTRAMUSCULAR | Status: DC | PRN
  Administered 2013-03-04: 4 mg via INTRAVENOUS

## 2013-03-04 MED ORDER — ACETAMINOPHEN 650 MG RE SUPP: 650.0000 mg | Freq: Four times a day (QID) | RECTAL | Status: DC | PRN

## 2013-03-04 MED ORDER — FLEET ENEMA 7-19 GM/118ML RE ENEM: 1.0000 | ENEMA | Freq: Once | RECTAL | Status: AC | PRN

## 2013-03-04 MED ORDER — PROMETHAZINE HCL 25 MG/ML IJ SOLN
INTRAMUSCULAR | Status: AC
  Filled 2013-03-04: qty 1

## 2013-03-04 MED ORDER — ONDANSETRON HCL 4 MG PO TABS: 4.0000 mg | ORAL_TABLET | Freq: Four times a day (QID) | ORAL | Status: DC | PRN

## 2013-03-04 MED ORDER — CEFAZOLIN SODIUM-DEXTROSE 2-3 GM-% IV SOLR
2.0000 g | INTRAVENOUS | Status: AC
  Administered 2013-03-04: 2 g via INTRAVENOUS

## 2013-03-04 MED ORDER — BISACODYL 10 MG RE SUPP: 10.0000 mg | Freq: Every day | RECTAL | Status: DC | PRN

## 2013-03-04 MED ORDER — LIDOCAINE HCL 1 % IJ SOLN
INTRAMUSCULAR | Status: DC | PRN
  Administered 2013-03-04: 5 mL

## 2013-03-04 MED ORDER — ACETAMINOPHEN 325 MG PO TABS: 650.0000 mg | ORAL_TABLET | Freq: Four times a day (QID) | ORAL | Status: DC | PRN

## 2013-03-04 MED ORDER — GLYCOPYRROLATE 0.2 MG/ML IJ SOLN
INTRAMUSCULAR | Status: DC | PRN
  Administered 2013-03-04: 0.6 mg via INTRAVENOUS

## 2013-03-04 MED ORDER — SODIUM CHLORIDE 0.9 % IJ SOLN
INTRAMUSCULAR | Status: AC
  Filled 2013-03-04: qty 10

## 2013-03-04 MED ORDER — SODIUM CHLORIDE 0.9 % IJ SOLN
INTRAMUSCULAR | Status: DC | PRN
  Administered 2013-03-04: 30 mL

## 2013-03-04 MED ORDER — ROCURONIUM BROMIDE 100 MG/10ML IV SOLN
INTRAVENOUS | Status: DC | PRN
  Administered 2013-03-04: 30 mg via INTRAVENOUS

## 2013-03-04 MED ORDER — RIVAROXABAN 10 MG PO TABS
10.0000 mg | ORAL_TABLET | Freq: Every day | ORAL | Status: DC
  Administered 2013-03-05 – 2013-03-06 (×2): 10 mg via ORAL
  Filled 2013-03-04 (×3): qty 1

## 2013-03-04 MED ORDER — DOCUSATE SODIUM 100 MG PO CAPS
100.0000 mg | ORAL_CAPSULE | Freq: Two times a day (BID) | ORAL | Status: DC
  Administered 2013-03-04 – 2013-03-06 (×4): 100 mg via ORAL

## 2013-03-04 MED ORDER — PHENYLEPHRINE HCL 10 MG/ML IJ SOLN
INTRAMUSCULAR | Status: DC | PRN
  Administered 2013-03-04 (×2): 80 ug via INTRAVENOUS

## 2013-03-04 MED ORDER — TRAMADOL HCL 50 MG PO TABS
50.0000 mg | ORAL_TABLET | Freq: Four times a day (QID) | ORAL | Status: DC | PRN
  Administered 2013-03-04: 50 mg via ORAL
  Filled 2013-03-04: qty 1

## 2013-03-04 MED ORDER — PROPOFOL 10 MG/ML IV BOLUS
INTRAVENOUS | Status: DC | PRN
  Administered 2013-03-04: 80 mg via INTRAVENOUS
  Administered 2013-03-04: 30 mg via INTRAVENOUS

## 2013-03-04 MED ORDER — PHENOL 1.4 % MT LIQD: 1.0000 | OROMUCOSAL | Status: DC | PRN

## 2013-03-04 MED ORDER — HYDROMORPHONE HCL PF 1 MG/ML IJ SOLN
INTRAMUSCULAR | Status: DC | PRN
  Administered 2013-03-04: 0.5 mg via INTRAVENOUS

## 2013-03-04 MED ORDER — DEXAMETHASONE SODIUM PHOSPHATE 10 MG/ML IJ SOLN
10.0000 mg | Freq: Once | INTRAMUSCULAR | Status: AC
  Administered 2013-03-04: 10 mg via INTRAVENOUS

## 2013-03-04 MED ORDER — ESMOLOL HCL 10 MG/ML IV SOLN
INTRAVENOUS | Status: DC | PRN
  Administered 2013-03-04 (×2): 20 mg via INTRAVENOUS

## 2013-03-04 MED ORDER — LACTATED RINGERS IV SOLN
INTRAVENOUS | Status: DC | PRN
  Administered 2013-03-04: 07:00:00 via INTRAVENOUS

## 2013-03-04 MED ORDER — DEXAMETHASONE SODIUM PHOSPHATE 10 MG/ML IJ SOLN
10.0000 mg | Freq: Every day | INTRAMUSCULAR | Status: AC
  Filled 2013-03-04: qty 1

## 2013-03-04 MED ORDER — METOCLOPRAMIDE HCL 5 MG/ML IJ SOLN: 5.0000 mg | Freq: Three times a day (TID) | INTRAMUSCULAR | Status: DC | PRN

## 2013-03-04 MED ORDER — METHOCARBAMOL 500 MG PO TABS: 500.0000 mg | ORAL_TABLET | Freq: Four times a day (QID) | ORAL | Status: DC | PRN

## 2013-03-04 MED ORDER — HYDROMORPHONE HCL PF 1 MG/ML IJ SOLN
INTRAMUSCULAR | Status: AC
  Filled 2013-03-04: qty 1

## 2013-03-04 MED ORDER — ONDANSETRON HCL 4 MG/2ML IJ SOLN: 4.0000 mg | Freq: Four times a day (QID) | INTRAMUSCULAR | Status: DC | PRN

## 2013-03-04 MED ORDER — DEXAMETHASONE 6 MG PO TABS
10.0000 mg | ORAL_TABLET | Freq: Every day | ORAL | Status: AC
  Administered 2013-03-05: 10 mg via ORAL
  Filled 2013-03-04: qty 1

## 2013-03-04 MED ORDER — METHYLPREDNISOLONE ACETATE 80 MG/ML IJ SUSP
INTRAMUSCULAR | Status: DC | PRN
  Administered 2013-03-04: 80 mg

## 2013-03-04 MED ORDER — METOCLOPRAMIDE HCL 10 MG PO TABS: 5.0000 mg | ORAL_TABLET | Freq: Three times a day (TID) | ORAL | Status: DC | PRN

## 2013-03-04 MED ORDER — BUPIVACAINE HCL (PF) 0.25 % IJ SOLN
INTRAMUSCULAR | Status: AC
  Filled 2013-03-04: qty 30

## 2013-03-04 MED ORDER — KETOROLAC TROMETHAMINE 15 MG/ML IJ SOLN
7.5000 mg | Freq: Four times a day (QID) | INTRAMUSCULAR | Status: AC | PRN
  Administered 2013-03-04: 7.5 mg via INTRAVENOUS
  Filled 2013-03-04: qty 1

## 2013-03-04 MED ORDER — BUPIVACAINE HCL 0.25 % IJ SOLN
INTRAMUSCULAR | Status: DC | PRN
  Administered 2013-03-04: 20 mL

## 2013-03-04 MED ORDER — BUPIVACAINE LIPOSOME 1.3 % IJ SUSP
INTRAMUSCULAR | Status: DC | PRN
  Administered 2013-03-04: 20 mL

## 2013-03-04 MED ORDER — MEPERIDINE HCL 50 MG/ML IJ SOLN
12.5000 mg | Freq: Once | INTRAMUSCULAR | Status: AC
  Administered 2013-03-04: 12.5 mg via INTRAVENOUS

## 2013-03-04 MED ORDER — SODIUM CHLORIDE 0.9 % IV SOLN: INTRAVENOUS | Status: DC

## 2013-03-04 MED ORDER — PROMETHAZINE HCL 25 MG/ML IJ SOLN
6.2500 mg | INTRAMUSCULAR | Status: DC | PRN
  Administered 2013-03-04: 6.25 mg via INTRAVENOUS

## 2013-03-04 MED ORDER — LIDOCAINE HCL 1 % IJ SOLN
INTRAMUSCULAR | Status: AC
  Filled 2013-03-04: qty 20

## 2013-03-04 MED ORDER — ACETAMINOPHEN 500 MG PO TABS
1000.0000 mg | ORAL_TABLET | Freq: Four times a day (QID) | ORAL | Status: AC
  Administered 2013-03-04 – 2013-03-05 (×4): 1000 mg via ORAL
  Filled 2013-03-04 (×4): qty 2

## 2013-03-04 MED ORDER — LEVOTHYROXINE SODIUM 50 MCG PO TABS
50.0000 ug | ORAL_TABLET | Freq: Every day | ORAL | Status: DC
  Administered 2013-03-05: 50 ug via ORAL
  Filled 2013-03-04 (×3): qty 1

## 2013-03-04 MED ORDER — FENTANYL CITRATE 0.05 MG/ML IJ SOLN
INTRAMUSCULAR | Status: DC | PRN
  Administered 2013-03-04 (×5): 50 ug via INTRAVENOUS

## 2013-03-04 MED ORDER — DIPHENHYDRAMINE HCL 12.5 MG/5ML PO ELIX
12.5000 mg | ORAL_SOLUTION | ORAL | Status: DC | PRN
  Administered 2013-03-05: 18:00:00 12.5 mg via ORAL
  Filled 2013-03-04: qty 5

## 2013-03-04 MED ORDER — CEFAZOLIN SODIUM 1-5 GM-% IV SOLN
1.0000 g | Freq: Four times a day (QID) | INTRAVENOUS | Status: AC
  Administered 2013-03-04 (×2): 1 g via INTRAVENOUS
  Filled 2013-03-04 (×2): qty 50

## 2013-03-04 MED ORDER — POLYETHYLENE GLYCOL 3350 17 G PO PACK: 17.0000 g | PACK | Freq: Every day | ORAL | Status: DC | PRN

## 2013-03-04 MED ORDER — HYDROMORPHONE HCL PF 1 MG/ML IJ SOLN
0.2500 mg | INTRAMUSCULAR | Status: DC | PRN
  Administered 2013-03-04 (×2): 0.5 mg via INTRAVENOUS

## 2013-03-04 MED ORDER — OXYCODONE HCL 5 MG PO TABS
5.0000 mg | ORAL_TABLET | ORAL | Status: DC | PRN
  Administered 2013-03-04 – 2013-03-06 (×11): 5 mg via ORAL
  Filled 2013-03-04 (×4): qty 1
  Filled 2013-03-04: qty 2
  Filled 2013-03-04 (×6): qty 1

## 2013-03-04 MED ORDER — MENTHOL 3 MG MT LOZG
1.0000 | LOZENGE | OROMUCOSAL | Status: DC | PRN
  Administered 2013-03-04: 21:00:00 3 mg via ORAL
  Filled 2013-03-04: qty 9

## 2013-03-04 MED ORDER — LIDOCAINE HCL (CARDIAC) 20 MG/ML IV SOLN
INTRAVENOUS | Status: DC | PRN
  Administered 2013-03-04: 100 mg via INTRAVENOUS

## 2013-03-04 MED ORDER — BUPIVACAINE LIPOSOME 1.3 % IJ SUSP
20.0000 mL | Freq: Once | INTRAMUSCULAR | Status: DC
  Filled 2013-03-04: qty 20

## 2013-03-04 SURGICAL SUPPLY — 60 items
BAG SPEC THK2 15X12 ZIP CLS (MISCELLANEOUS) ×2
BAG ZIPLOCK 12X15 (MISCELLANEOUS) ×3 IMPLANT
BANDAGE ELASTIC 6 VELCRO ST LF (GAUZE/BANDAGES/DRESSINGS) ×3 IMPLANT
BANDAGE ESMARK 6X9 LF (GAUZE/BANDAGES/DRESSINGS) ×2 IMPLANT
BLADE SAG 18X100X1.27 (BLADE) ×3 IMPLANT
BLADE SAW SGTL 11.0X1.19X90.0M (BLADE) ×3 IMPLANT
BNDG CMPR 9X6 STRL LF SNTH (GAUZE/BANDAGES/DRESSINGS) ×2
BNDG ESMARK 6X9 LF (GAUZE/BANDAGES/DRESSINGS) ×3
BOWL SMART MIX CTS (DISPOSABLE) ×3 IMPLANT
CAPT RP KNEE ×1 IMPLANT
CEMENT HV SMART SET (Cement) ×6 IMPLANT
CLOSURE STERI-STRIP 1/4X4 (GAUZE/BANDAGES/DRESSINGS) ×2 IMPLANT
CLOTH BEACON ORANGE TIMEOUT ST (SAFETY) ×3 IMPLANT
CUFF TOURN SGL QUICK 34 (TOURNIQUET CUFF) ×3
CUFF TRNQT CYL 34X4X40X1 (TOURNIQUET CUFF) ×2 IMPLANT
DECANTER SPIKE VIAL GLASS SM (MISCELLANEOUS) ×3 IMPLANT
DRAPE EXTREMITY T 121X128X90 (DRAPE) ×3 IMPLANT
DRAPE POUCH INSTRU U-SHP 10X18 (DRAPES) ×3 IMPLANT
DRAPE U-SHAPE 47X51 STRL (DRAPES) ×3 IMPLANT
DRSG ADAPTIC 3X8 NADH LF (GAUZE/BANDAGES/DRESSINGS) ×3 IMPLANT
DRSG PAD ABDOMINAL 8X10 ST (GAUZE/BANDAGES/DRESSINGS) ×3 IMPLANT
DURAPREP 26ML APPLICATOR (WOUND CARE) ×3 IMPLANT
ELECT REM PT RETURN 9FT ADLT (ELECTROSURGICAL) ×3
ELECTRODE REM PT RTRN 9FT ADLT (ELECTROSURGICAL) ×2 IMPLANT
EVACUATOR 1/8 PVC DRAIN (DRAIN) ×3 IMPLANT
FACESHIELD LNG OPTICON STERILE (SAFETY) ×15 IMPLANT
GLOVE BIO SURGEON STRL SZ7.5 (GLOVE) IMPLANT
GLOVE BIO SURGEON STRL SZ8 (GLOVE) ×3 IMPLANT
GLOVE BIOGEL PI IND STRL 8 (GLOVE) ×4 IMPLANT
GLOVE BIOGEL PI INDICATOR 8 (GLOVE) ×2
GLOVE SURG SS PI 6.5 STRL IVOR (GLOVE) ×4 IMPLANT
GOWN PREVENTION PLUS LG XLONG (DISPOSABLE) ×3 IMPLANT
GOWN STRL REIN XL XLG (GOWN DISPOSABLE) ×4 IMPLANT
HANDPIECE INTERPULSE COAX TIP (DISPOSABLE) ×3
IMMOBILIZER KNEE 20 (SOFTGOODS) ×3
IMMOBILIZER KNEE 20 THIGH 36 (SOFTGOODS) ×2 IMPLANT
KIT BASIN OR (CUSTOM PROCEDURE TRAY) ×3 IMPLANT
MANIFOLD NEPTUNE II (INSTRUMENTS) ×3 IMPLANT
NDL SAFETY ECLIPSE 18X1.5 (NEEDLE) ×4 IMPLANT
NEEDLE HYPO 18GX1.5 SHARP (NEEDLE) ×6
NEEDLE HYPO 22GX1.5 SAFETY (NEEDLE) ×1 IMPLANT
NS IRRIG 1000ML POUR BTL (IV SOLUTION) ×3 IMPLANT
PACK TOTAL JOINT (CUSTOM PROCEDURE TRAY) ×3 IMPLANT
PADDING CAST COTTON 6X4 STRL (CAST SUPPLIES) ×5 IMPLANT
POSITIONER SURGICAL ARM (MISCELLANEOUS) ×3 IMPLANT
SET HNDPC FAN SPRY TIP SCT (DISPOSABLE) ×2 IMPLANT
SPONGE GAUZE 4X4 12PLY (GAUZE/BANDAGES/DRESSINGS) ×3 IMPLANT
STRIP CLOSURE SKIN 1/2X4 (GAUZE/BANDAGES/DRESSINGS) ×6 IMPLANT
SUCTION FRAZIER 12FR DISP (SUCTIONS) ×3 IMPLANT
SUT MNCRL AB 4-0 PS2 18 (SUTURE) ×3 IMPLANT
SUT VIC AB 2-0 CT1 27 (SUTURE) ×9
SUT VIC AB 2-0 CT1 TAPERPNT 27 (SUTURE) ×6 IMPLANT
SUT VLOC 180 0 24IN GS25 (SUTURE) ×3 IMPLANT
SYR 20CC LL (SYRINGE) ×3 IMPLANT
SYR 50ML LL SCALE MARK (SYRINGE) ×3 IMPLANT
SYR CONTROL 10ML LL (SYRINGE) ×2 IMPLANT
TOWEL OR 17X26 10 PK STRL BLUE (TOWEL DISPOSABLE) ×6 IMPLANT
TRAY FOLEY CATH 14FRSI W/METER (CATHETERS) ×3 IMPLANT
WATER STERILE IRR 1500ML POUR (IV SOLUTION) ×3 IMPLANT
WRAP KNEE MAXI GEL POST OP (GAUZE/BANDAGES/DRESSINGS) ×3 IMPLANT

## 2013-03-04 NOTE — Interval H&P Note (Signed)
History and Physical Interval Note:  03/04/2013 7:10 AM  Linda Parker  has presented today for surgery, with the diagnosis of OA OF RIGHT KNEE  The various methods of treatment have been discussed with the patient and family. After consideration of risks, benefits and other options for treatment, the patient has consented to  Procedure(s): RIGHT TOTAL KNEE ARTHROPLASTY (Right) left KNEE cortisone INJECTION (Left) as a surgical intervention .  The patient's history has been reviewed, patient examined, no change in status, stable for surgery.  I have reviewed the patient's chart and labs.  Questions were answered to the patient's satisfaction.     Loanne Drilling

## 2013-03-04 NOTE — H&P (View-Only) (Signed)
Li H. Bick  -   Please Note - The patient goes be "Linda Parker", not Evangelynn.  DOB: 03/16/1936 Married / Language: English / Race: White Female Date of Admission:  03/04/2013  Chief Complaint:  Right greater than Left Knee Pain  History of Present Illness The patient is a 77 year old female who comes in for a preoperative History and Physical. The patient is scheduled for a right total knee arthroplasty to be performed by Dr. Frank V. Aluisio, MD at [facility] on 03/04/2013. The patient is a 77 year old female who presents today for follow up of their knee. The patient is being followed for their right knee pain and osteoarthritis. Symptoms reported today include: pain and swelling. The patient feels that they are doing poorly and report their pain level to be moderate to severe. Current treatment includes: NSAIDs (aleve). The patient presents following post Synvisc series (No improvement, actually seems worse w/ swelling and pain). Unfortunately the Synvisc did not help. She says for the past 6-7 months, the knee has essentially been taking over her life and preventing her from doing things she wants to do. It bothered her before, but is much worse now. She had the MRI which showed degenerative change and a meniscal tear. She is not having mechanical symptoms, but having more pain and swelling.  She is at a a ponit where she would like something done.  She is ready tor proceed with knee repalcement.  She would also like to get a cortisone injection into the opposite knee at the same time. They have been treated conservatively in the past for the above stated problem and despite conservative measures, they continue to have progressive pain and severe functional limitations and dysfunction. They have failed non-operative management including home exercise, medications, and injections. It is felt that they would benefit from undergoing total joint replacement. Risks and benefits of the  procedure have been discussed with the patient and they elect to proceed with surgery. There are no active contraindications to surgery such as ongoing infection or rapidly progressive neurological disease.  Problem List Osteoarthritis, knee (715.96)    Allergies Codeine Phosphate *ANALGESICS - OPIOID* Erythromycin *MACROLIDES*. all mycin drugs Sulfanilamide *CHEMICALS*    Family History Congestive Heart Failure. mother Hypertension. child Rheumatoid Arthritis. mother Osteoarthritis. First Degree Relatives. mother and sister Cancer. father    Social History Tobacco use. Never smoker. never smoker Current work status. retired Drug/Alcohol Rehab (Currently). no Drug/Alcohol Rehab (Previously). no Children. 3 Alcohol use. current drinker; drinks wine; only occasionally per week Pain Contract. no Tobacco / smoke exposure. no Exercise. does running / walking Number of flights of stairs before winded. 2-3 Illicit drug use. no Living situation. live with spouse Marital status. married    Medication History Estradiol Valerate ( Intramuscular) Specific dose unknown - Active. Synthroid ( Oral) Specific dose unknown - Active. (.05mg) aleve ( as needed) Active. Aspirin 81mg Active.   Past Surgical History Arthroscopy of Knee. bilateral Hysterectomy. complete (non-cancerous)   Medical History Osteoarthritis Vertigo Heart murmur Valvular heart disease. Aortic Stenosis, moderate to severe (per Crenshaw) Hypothyroidism Osteopenia Menopause    Review of Systems General:Not Present- Chills, Fever, Night Sweats, Fatigue, Weight Gain, Weight Loss and Memory Loss. Skin:Not Present- Hives, Itching, Rash, Eczema and Lesions. HEENT:Not Present- Tinnitus, Headache, Double Vision, Visual Loss, Hearing Loss and Dentures. Respiratory:Not Present- Shortness of breath with exertion, Shortness of breath at rest, Allergies, Coughing up  blood and Chronic Cough. Cardiovascular:Not Present- Chest Pain, Racing/skipping   heartbeats, Difficulty Breathing Lying Down, Murmur, Swelling and Palpitations. Gastrointestinal:Not Present- Bloody Stool, Heartburn, Abdominal Pain, Vomiting, Nausea, Constipation, Diarrhea, Difficulty Swallowing, Jaundice and Loss of appetitie. Female Genitourinary:Not Present- Blood in Urine, Urinary frequency, Weak urinary stream, Discharge, Flank Pain, Incontinence, Painful Urination, Urgency, Urinary Retention and Urinating at Night. Musculoskeletal:Present- Joint Swelling, Joint Pain and Morning Stiffness. Not Present- Muscle Weakness, Muscle Pain, Back Pain and Spasms. Neurological:Not Present- Tremor, Dizziness, Blackout spells, Paralysis, Difficulty with balance and Weakness. Psychiatric:Not Present- Insomnia.    Vitals Weight: 129 lb Height: 63 in Body Surface Area: 1.61 m Body Mass Index: 22.85 kg/m Pulse: 76 (Regular) Resp.: 16 (Unlabored) BP: 114/64 (Sitting, Right Arm, Standard)     Physical Exam The physical exam findings are as follows:   General Mental Status - Alert, cooperative and good historian. General Appearance- pleasant. Not in acute distress. Orientation- Oriented X3. Build & Nutrition- Well nourished and Well developed.   Head and Neck Head- normocephalic, atraumatic . Neck Global Assessment- supple. no bruit auscultated on the right and no bruit auscultated on the left.   Eye Pupil- Bilateral- Regular and Round. Motion- Bilateral- EOMI.   Chest and Lung Exam Auscultation: Breath sounds:- clear at anterior chest wall and - clear at posterior chest wall. Adventitious sounds:- No Adventitious sounds.   Cardiovascular Auscultation:Rhythm- Regular rate and rhythm. Heart Sounds- S1 WNL and S2 WNL. Murmurs & Other Heart Sounds: Murmur 1:Location- Aortic Area and Pulmonic Area. Timing- Holosystolic. Grade- III/VI.  Character- Harsh.   Abdomen Palpation/Percussion:Tenderness- Abdomen is non-tender to palpation. Rigidity (guarding)- Abdomen is soft. Auscultation:Auscultation of the abdomen reveals - Bowel sounds normal.   Female Genitourinary  Not done, not pertinent to present illness  Musculoskeletal She is alert and oriented in no apparent distress. Her right knee shows a tiny effusion. There is marked crepitus on range of motion of the knee. She is tender lateral greater than medial. Slight valgus deformity. No instability. Her left knee no effusion. Moderate crepitus on range of motion. Range 5 to 120. No specific tenderness and no instability.   RADIOGRAPHS: X-rays show the tibial subluxation and the bone on bone changes, patellofemoral and medial as well as the lesser extent lateral. She had the MRI also showing the degeneration.   Assessment & Plan Osteoarthritis, knee (715.96) Impression: Right Knee and Left Knee  Note: Plan is for a Right Total Knee Replacement and a Left Knee Cortisone Injection by Dr. Aluisio.  Plan is to go to home.  PCP - Dr. Tisovec - Patient has been seen preoperatively and felt to be stable for surgery. Cardiology - Dr. Crenshaw - Patient has been seen preoperatively and felt to be stable for surgery.  The patient does not have any contraindications and will receive TXA (tranexamic acid) prior to surgery.  Please Note - The patient goes be "Linda Parker", not Analysa.  Signed electronically by Exa Bomba L Cyndy Braver, III PA-C  

## 2013-03-04 NOTE — Preoperative (Signed)
Beta Blockers   Reason not to administer Beta Blockers:Not Applicable 

## 2013-03-04 NOTE — Progress Notes (Signed)
Shaking and shivering stopped. 

## 2013-03-04 NOTE — Progress Notes (Signed)
Dr. Council Mechanic in- made aware of patients' EKG readings in PACU- sinus rhythmn with PACS

## 2013-03-04 NOTE — Transfer of Care (Signed)
Immediate Anesthesia Transfer of Care Note  Patient: Linda Parker  Procedure(s) Performed: Procedure(s) (LRB): RIGHT TOTAL KNEE ARTHROPLASTY (Right) left KNEE cortisone INJECTION (Left)  Patient Location: PACU  Anesthesia Type: General  Level of Consciousness: sedated, patient cooperative and responds to stimulation  Airway & Oxygen Therapy: Patient Spontanous Breathing and Patient connected to face mask oxgen  Post-op Assessment: Report given to PACU RN and Post -op Vital signs reviewed and stable  Post vital signs: Reviewed and stable  Complications: No apparent anesthesia complications

## 2013-03-04 NOTE — Anesthesia Postprocedure Evaluation (Signed)
  Anesthesia Post-op Note  Patient: Linda Parker  Procedure(s) Performed: Procedure(s) (LRB): RIGHT TOTAL KNEE ARTHROPLASTY (Right) left KNEE cortisone INJECTION (Left)  Patient Location: PACU  Anesthesia Type: General  Level of Consciousness: awake and alert   Airway and Oxygen Therapy: Patient Spontanous Breathing  Post-op Pain: mild  Post-op Assessment: Post-op Vital signs reviewed, Patient's Cardiovascular Status Stable, Respiratory Function Stable, Patent Airway and No signs of Nausea or vomiting  Last Vitals:  Filed Vitals:   03/04/13 0925  BP:   Pulse: 60  Temp:   Resp: 15    Post-op Vital Signs: stable   Complications: No apparent anesthesia complications. Some PACs on monitor. She has a history of this.

## 2013-03-04 NOTE — Progress Notes (Signed)
Medication for shaking and shivering given. 

## 2013-03-04 NOTE — Progress Notes (Signed)
Left knee bandaide CDI.

## 2013-03-04 NOTE — Plan of Care (Signed)
Problem: Consults Goal: Diagnosis- Total Joint Replacement Right total knee     

## 2013-03-04 NOTE — Progress Notes (Signed)
Left knee bandaide CDI. 

## 2013-03-04 NOTE — Op Note (Addendum)
Pre-operative diagnosis- Osteoarthritis  Bilateral knee(s)  Post-operative diagnosis- Osteoarthritis Bilateral knee(s)  Procedure-  Right  Total Knee Arthroplasty   Left knee cortisone injection  Surgeon- Gus Rankin. Hinda Lindor, MD  Assistant- Dimitri Ped, PA-C   Anesthesia-  General EBL-* No blood loss amount entered *  Drains Hemovac  Tourniquet time-  Total Tourniquet Time Documented: Thigh (Right) - 29 minutes Total: Thigh (Right) - 29 minutes    Complications- None  Condition-PACU - hemodynamically stable.   Brief Clinical Note  Linda Parker is a 77 y.o. year old female with end stage OA of her right knee with progressively worsening pain and dysfunction. She has constant pain, with activity and at rest and significant functional deficits with difficulties even with ADLs. She has had extensive non-op management including analgesics, injections of cortisone, and home exercise program, but remains in significant pain with significant dysfunction.Radiographs show bone on bone arthritis medial and patellofemoral. She presents now for right Total Knee Arthroplasty.    Procedure in detail---   The patient is brought into the operating room and positioned supine on the operating table. After successful administration of  General,   a tourniquet is placed high on the  Right thigh(s) and the lower extremity is prepped and draped in the usual sterile fashion. Time out is performed by the operating team and then the  Right lower extremity is wrapped in Esmarch, knee flexed and the tourniquet inflated to 300 mmHg.       A midline incision is made with a ten blade through the subcutaneous tissue to the level of the extensor mechanism. A fresh blade is used to make a medial parapatellar arthrotomy. Soft tissue over the proximal medial tibia is subperiosteally elevated to the joint line with a knife and into the semimembranosus bursa with a Cobb elevator. Soft tissue over the proximal lateral tibia  is elevated with attention being paid to avoiding the patellar tendon on the tibial tubercle. The patella is everted, knee flexed 90 degrees and the ACL and PCL are removed. Findings are bone on bone medial and patellofemoral with large medial osteophytes.        The drill is used to create a starting hole in the distal femur and the canal is thoroughly irrigated with sterile saline to remove the fatty contents. The 5 degree Right  valgus alignment guide is placed into the femoral canal and the distal femoral cutting block is pinned to remove 10 mm off the distal femur. Resection is made with an oscillating saw.      The tibia is subluxed forward and the menisci are removed. The extramedullary alignment guide is placed referencing proximally at the medial aspect of the tibial tubercle and distally along the second metatarsal axis and tibial crest. The block is pinned to remove 2mm off the more deficient medial  side. Resection is made with an oscillating saw. Size 2.5is the most appropriate size for the tibia and the proximal tibia is prepared with the modular drill and keel punch for that size.      The femoral sizing guide is placed and size 2.5 is most appropriate. Rotation is marked off the epicondylar axis and confirmed by creating a rectangular flexion gap at 90 degrees. The size 2.5 cutting block is pinned in this rotation and the anterior, posterior and chamfer cuts are made with the oscillating saw. The intercondylar block is then placed and that cut is made.      Trial size 2.5 tibial component,  trial size 2.5 posterior stabilized femur and a 10  mm posterior stabilized rotating platform insert trial is placed. Full extension is achieved with excellent varus/valgus and anterior/posterior balance throughout full range of motion. The patella is everted and thickness measured to be 22  mm. Free hand resection is taken to 12 mm, a 35 template is placed, lug holes are drilled, trial patella is placed, and  it tracks normally. Osteophytes are removed off the posterior femur with the trial in place. All trials are removed and the cut bone surfaces prepared with pulsatile lavage. Cement is mixed and once ready for implantation, the size 2.5 tibial implant, size  2.5 posterior stabilized femoral component, and the size 35 patella are cemented in place and the patella is held with the clamp. The trial insert is placed and the knee held in full extension. The Exparel (20 ml mixed with 30 ml saline) and .25% Bupivicaine, are injected into the extensor mechanism, posterior capsule, medial and lateral gutters and subcutaneous tissues.  All extruded cement is removed and once the cement is hard the permanent 10 mm posterior stabilized rotating platform insert is placed into the tibial tray.      The wound is copiously irrigated with saline solution and the extensor mechanism closed over a hemovac drain with #1 PDS suture. The tourniquet is released for a total tourniquet time of 29  minutes. Flexion against gravity is 140 degrees and the patella tracks normally. Subcutaneous tissue is closed with 2.0 vicryl and subcuticular with running 4.0 Monocryl. The incision is cleaned and dried and steri-strips and a bulky sterile dressing are applied. The left knee is then injected with 5 ml of 1% Lidocaine and 80 mg Depomedrol after a sterile prep. The right leg is placed into a knee immobilizer and the patient is awakened and transported to recovery in stable condition.      Please note that a surgical assistant was a medical necessity for this procedure in order to perform it in a safe and expeditious manner. Surgical assistant was necessary to retract the ligaments and vital neurovascular structures to prevent injury to them and also necessary for proper positioning of the limb to allow for anatomic placement of the prosthesis.   Gus Rankin Samarrah Tranchina, MD    03/04/2013, 8:12 AM

## 2013-03-04 NOTE — Progress Notes (Signed)
Utilization review completed.  

## 2013-03-05 DIAGNOSIS — D62 Acute posthemorrhagic anemia: Secondary | ICD-10-CM

## 2013-03-05 LAB — BASIC METABOLIC PANEL
BUN: 9 mg/dL (ref 6–23)
Calcium: 9.3 mg/dL (ref 8.4–10.5)
Creatinine, Ser: 0.65 mg/dL (ref 0.50–1.10)
GFR calc Af Amer: >90 mL/min (ref >90)
Potassium: 4 mEq/L (ref 3.5–5.1)

## 2013-03-05 LAB — CBC
HCT: 33.2 % — ABNORMAL LOW (ref 36.0–46.0)
MCH: 32.7 pg (ref 26.0–34.0)
MCHC: 33.7 g/dL (ref 30.0–36.0)
MCV: 97.1 fL (ref 78.0–100.0)
Platelets: 244 10*3/uL (ref 150–400)
RDW: 13.6 % (ref 11.5–15.5)

## 2013-03-05 NOTE — Care Management Note (Addendum)
    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     Action/Plan:   CM spoke with patient. Plans are for her to return to her home where spouse will be caregiver. She will need RW and 3n1. Wants agency that MD office referred  her to.   Anticipated DC Date:  03/06/2013   Anticipated DC Plan:  HOME W HOME HEALTH SERVICES  In-house referral  Clinical Social Worker      DC Planning Services  CM consult      PAC Choice  DURABLE MEDICAL EQUIPMENT  HOME HEALTH   Choice offered to / List presented to:  C-1 Patient   DME arranged  3-N-1      DME agency  [facility]     HH arranged  HH-2 PT      [facility] agency  [facility]   Status of service:  Completed, signed off Medicare Important Message given?  NA - LOS <3 / Initial given by admissions (If response is "NO", the following Medicare IM given date fields will be blank) Date Medicare IM given:   Date Additional Medicare IM given:    Discharge Disposition:  HOME W HOME HEALTH SERVICES  Per UR Regulation:    If discussed at Long Length of Stay Meetings, dates discussed:    Comments:  03/05/2013 Colleen Can BSN RN CCM [phone] Plans for discharge today with [facility] services in place. Genevieve Norlander will start services tomorrow. Pt's husband plans to get RW for patient at retail store. States RW brought by DME rep is too wide. (YRW brought by [facility] rep-they have no smaller walkers). Pt did not accept youth rolling walker.

## 2013-03-05 NOTE — Evaluation (Signed)
Occupational Therapy Evaluation Patient Details Name: Linda Parker MRN: [id number] DOB: 10/04/1935 Today's Date: 03/05/2013 Time: 1191-4782 OT Time Calculation (min): 29 min  OT Assessment / Plan / Recommendation History of present illness Pt presents with R TKR   Clinical Impression   Pt transferred into bathroom to 3in1 with min assist. Will benefit from skilled OT services to improve ADL independence for d/c home with family.     OT Assessment  Patient needs continued OT Services    Follow Up Recommendations  No OT follow up;Supervision/Assistance - 24 hour    Barriers to Discharge      Equipment Recommendations  3 in 1 bedside comode    Recommendations for Other Services    Frequency  Min 2X/week    Precautions / Restrictions Precautions Precautions: Fall;Knee Required Braces or Orthoses: Knee Immobilizer - Right Knee Immobilizer - Right: Discontinue once straight leg raise with < 10 degree lag Restrictions Weight Bearing Restrictions: No Other Position/Activity Restrictions: WBAT   Pertinent Vitals/Pain Pt didn't complain of pain. States "sleepy" from not sleeping well last night.     ADL  Eating/Feeding: Independent Where Assessed - Eating/Feeding: Chair Grooming: Wash/dry hands;Min guard Where Assessed - Grooming: Unsupported standing Upper Body Bathing: Chest;Right arm;Left arm;Abdomen;Set up Where Assessed - Upper Body Bathing: Unsupported sitting Lower Body Bathing: Minimal assistance Where Assessed - Lower Body Bathing: Supported sit to stand Upper Body Dressing: Set up Where Assessed - Upper Body Dressing: Unsupported sitting Lower Body Dressing: Moderate assistance Where Assessed - Lower Body Dressing: Supported sit to Pharmacist, hospital: Minimal Web designer: Raised toilet seat with arms (or 3-in-1 over toilet) Toileting - Clothing Manipulation and Hygiene: Minimal assistance Where Assessed - Medical sales representative and Hygiene: Sit to stand from 3-in-1 or toilet Equipment Used: Rolling walker;Knee Immobilizer ADL Comments: Spouse can assist with LB ADL but reviewed technique of how to don/doff KI and when to wear. Pt wants a 3in1 especially for night use but discussed using initially over toilet also for handles with transitions.     OT Diagnosis: Generalized weakness  OT Problem List: Decreased strength;Decreased knowledge of use of DME or AE OT Treatment Interventions: Self-care/ADL training;DME and/or AE instruction;Therapeutic activities;Patient/family education   OT Goals(Current goals can be found in the care plan section) Acute Rehab OT Goals Patient Stated Goal: to return home OT Goal Formulation: With patient Time For Goal Achievement: 03/12/13 Potential to Achieve Goals: Good  Visit Information  Last OT Received On: 03/05/13 Assistance Needed: +1 History of Present Illness: Pt presents with R TKR       Prior Functioning     Home Living Family/patient expects to be discharged to:: Private residence Living Arrangements: Spouse/significant other Available Help at Discharge: Family;Available 24 hours/day Type of Home: House Home Access: Stairs to enter [employer] of Steps: 1 Entrance Stairs-Rails: None Home Layout: One level Home Equipment: Cane - single point;Shower seat Additional Comments: walk in shower, handicapped height toilet Prior Function Level of Independence: Independent Communication Communication: No difficulties         Vision/Perception     Cognition  Cognition Arousal/Alertness: Awake/alert Behavior During Therapy: WFL for tasks assessed/performed Overall Cognitive Status: Within Functional Limits for tasks assessed    Extremity/Trunk Assessment Upper Extremity Assessment Upper Extremity Assessment: Overall WFL for tasks assessed     Mobility  Transfers Transfers: Sit to Stand;Stand to Sit Sit to Stand: 4: Min  assist;With upper extremity assist;From chair/3-in-1 Stand to Sit: 4: Min assist;With  upper extremity assist;To chair/3-in-1 Details for Transfer Assistance: with verbal cues for hand placement and LE management     Exercise     Balance Balance Balance Assessed: Yes Dynamic Standing Balance Dynamic Standing - Level of Assistance: 4: Min assist   End of Session OT - End of Session Equipment Utilized During Treatment: Gait belt;Rolling walker;Right knee immobilizer Activity Tolerance: Patient tolerated treatment well Patient left: in chair;with call bell/phone within reach  GO     Lennox Laity [phone] 03/05/2013, 10:46 AM

## 2013-03-05 NOTE — Progress Notes (Signed)
  Please Note - The patient goes be "Linda Parker", not Linda Parker.  Subjective: 1 Day Post-Op Procedure(s) (LRB): RIGHT TOTAL KNEE ARTHROPLASTY (Right) left KNEE cortisone INJECTION (Left) Patient reports pain as mild.   Patient seen in rounds with Dr. Lequita Halt. Family in room. Patient is well, and has had no acute complaints or problems We will start therapy today.  Plan is to go Home after hospital stay.  Objective: Vital signs in last 24 hours: Temp:  [97.5 F (36.4 C)-98.7 F (37.1 C)] 98.7 F (37.1 C) (11/11 0500) Pulse Rate:  [60-93] 76 (11/11 0500) Resp:  [13-22] 16 (11/11 0500) BP: (111-163)/(57-81) 111/69 mmHg (11/11 0500) SpO2:  [95 %-100 %] 98 % (11/11 0500) Weight:  [60.328 kg (133 lb)] 60.328 kg (133 lb) (11/10 1025)  Intake/Output from previous day:  Intake/Output Summary (Last 24 hours) at 03/05/13 0834 Last data filed at 03/05/13 0810  Gross per 24 hour  Intake 4086.25 ml  Output   2835 ml  Net 1251.25 ml    Intake/Output this shift: Total I/O In: 100 [P.O.:100] Out: -   Labs:  Recent Labs  03/05/13 0403  HGB 11.2*    Recent Labs  03/05/13 0403  WBC 15.0*  RBC 3.42*  HCT 33.2*  PLT 244    Recent Labs  03/05/13 0403  NA 136  K 4.0  CL 103  CO2 24  BUN 9  CREATININE 0.65  GLUCOSE 145*  CALCIUM 9.3   No results found for this basename: LABPT, INR,  in the last 72 hours  EXAM General - Patient is Alert, Appropriate and Oriented Extremity - Neurovascular intact Sensation intact distally Dorsiflexion/Plantar flexion intact Dressing - dressing C/D/I Motor Function - intact, moving foot and toes well on exam.  Hemovac pulled without difficulty.  Past Medical History  Diagnosis Date  . Unspecified hypothyroidism   . Aortic stenosis   . Palpitations   . Dysrhythmia     when taking antihistamines  . Arthritis     Assessment/Plan: 1 Day Post-Op Procedure(s) (LRB): RIGHT TOTAL KNEE ARTHROPLASTY (Right) left KNEE cortisone INJECTION  (Left) Principal Problem:   OA (osteoarthritis) of knee Active Problems:   Postoperative anemia due to acute blood loss  Estimated body mass index is 23.57 kg/(m^2) as calculated from the following:   Height as of this encounter: 5\' 3"  (1.6 m).   Weight as of this encounter: 60.328 kg (133 lb). Advance diet Up with therapy Plan for discharge tomorrow Discharge home with home health  DVT Prophylaxis - Xarelto, ASA 81 mg on hold Weight-Bearing as tolerated to right leg D/C O2 and Pulse OX and try on Room Air  Please Note - The patient goes be "Linda Parker", not Linda Parker.  PERKINS, ALEXZANDREW 03/05/2013, 8:34 AM

## 2013-03-05 NOTE — Progress Notes (Signed)
Physical Therapy Treatment Patient Details Name: RUTHER EPHRAIM MRN: [id number] DOB: [birth year] Today's Date: 03/05/2013 Time: 1329-1410 PT Time Calculation (min): 41 min  PT Assessment / Plan / Recommendation  History of Present Illness     PT Comments   Pt making good progress with therapy.  Tolerated increased ambulation distance and supine exercises.    Follow Up Recommendations  Home health PT     Does the patient have the potential to tolerate intense rehabilitation     Barriers to Discharge        Equipment Recommendations  Rolling walker with 5" wheels;3in1 (PT)    Recommendations for Other Services    Frequency 7X/week   Progress towards PT Goals Progress towards PT goals: Progressing toward goals  Plan Current plan remains appropriate    Precautions / Restrictions Precautions Precautions: Fall;Knee Required Braces or Orthoses: Knee Immobilizer - Right Knee Immobilizer - Right: Discontinue once straight leg raise with < 10 degree lag Restrictions Weight Bearing Restrictions: No Other Position/Activity Restrictions: WBAT   Pertinent Vitals/Pain 4/10 pain, premedicated    Mobility  Bed Mobility Bed Mobility: Sit to Supine Sit to Supine: 4: Min assist;HOB flat Details for Bed Mobility Assistance: Assist for RLE into bed with cues for adjusting hips once in bed.  Transfers Transfers: Sit to Stand;Stand to Sit Sit to Stand: 4: Min assist;With upper extremity assist;From chair/3-in-1 Stand to Sit: 4: Min assist;With upper extremity assist;To chair/3-in-1 Details for Transfer Assistance: Continue to provide min cues for hand placement and LE management.  Ambulation/Gait Ambulation/Gait Assistance: 4: Min guard Ambulation Distance (Feet): 60 Feet Assistive device: Rolling walker Ambulation/Gait Assistance Details: Min cues for sequencing/technique with RW and to maintain upright posture.  Gait Pattern: Step-to pattern;Decreased stride length;Antalgic Gait  velocity: decreased Stairs: No Wheelchair Mobility Wheelchair Mobility: No    Exercises Total Joint Exercises Ankle Circles/Pumps: AROM;Both;20 reps Quad Sets: AROM;Right;10 reps Heel Slides: AAROM;Right;10 reps Hip ABduction/ADduction: AAROM;Right;10 reps Straight Leg Raises: AAROM;Right;10 reps Goniometric ROM: Pt with approx 35 deg PROM knee flex   PT Diagnosis:    PT Problem List:   PT Treatment Interventions:     PT Goals (current goals can now be found in the care plan section) Acute Rehab PT Goals Patient Stated Goal: to return home PT Goal Formulation: With patient/family Time For Goal Achievement: 03/08/13 Potential to Achieve Goals: Good  Visit Information  Last PT Received On: 03/05/13 Assistance Needed: +1    Subjective Data  Subjective: I want to get back into the bed.  Patient Stated Goal: to return home   Cognition  Cognition Arousal/Alertness: Awake/alert Behavior During Therapy: WFL for tasks assessed/performed Overall Cognitive Status: Within Functional Limits for tasks assessed    Balance     End of Session PT - End of Session Equipment Utilized During Treatment: Right knee immobilizer Activity Tolerance: Patient tolerated treatment well Patient left: in bed;with call bell/phone within reach Nurse Communication: Mobility status CPM Right Knee CPM Right Knee: On   GP     Vista Deck 03/05/2013, 4:35 PM

## 2013-03-05 NOTE — Evaluation (Signed)
Physical Therapy Evaluation Patient Details Name: Linda Parker MRN: [id number] DOB: 1936/03/09 Today's Date: 03/05/2013 Time: 4696-2952 PT Time Calculation (min): 50 min  PT Assessment / Plan / Recommendation History of Present Illness     Clinical Impression  Pt presents with R TKA POD 1 with decreased strength, ROM and mobility.  Tolerated OOB and ambulation into hallway well with slight increase in pain.  No c/o dizziness/nausea while in upright position.  Feel that pt will benefit from continued acute PT services to address deficits mentioned below.  PT recommends HHPT for follow up at D/C to maximize pts safety.     PT Assessment  Patient needs continued PT services    Follow Up Recommendations  Home health PT    Does the patient have the potential to tolerate intense rehabilitation      Barriers to Discharge        Equipment Recommendations  Rolling walker with 5" wheels;3in1 (PT)    Recommendations for Other Services OT consult   Frequency 7X/week    Precautions / Restrictions Precautions Precautions: Fall Required Braces or Orthoses: Knee Immobilizer - Right Knee Immobilizer - Right: Discontinue once straight leg raise with < 10 degree lag Restrictions Weight Bearing Restrictions: No Other Position/Activity Restrictions: WBAT   Pertinent Vitals/Pain 2/10      Mobility  Bed Mobility Bed Mobility: Supine to Sit Supine to Sit: 4: Min assist Details for Bed Mobility Assistance: Assist for RLE out of bed with min cues for hand placement on bed instead of rails to better simulate home environment.  Transfers Transfers: Sit to Stand;Stand to Sit Sit to Stand: 4: Min assist;From elevated surface;From bed Stand to Sit: 4: Min assist;To chair/3-in-1;With armrests Details for Transfer Assistance: Performed x 2 from bed and from 3in1 with min assist to steady with min cues for hand placement, LE management, and safety.  Ambulation/Gait Ambulation/Gait Assistance: 4:  Min guard Ambulation Distance (Feet): 40 Feet Assistive device: Rolling walker Ambulation/Gait Assistance Details: Min/guard for steadying with cues for upright posture, sequencing/technique with RW, and also for turning to ensure safety.  Gait Pattern: Step-to pattern;Decreased stride length;Antalgic Gait velocity: decreased Stairs: No Wheelchair Mobility Wheelchair Mobility: No    Exercises     PT Diagnosis: Difficulty walking;Acute pain;Generalized weakness  PT Problem List: Decreased strength;Decreased range of motion;Decreased activity tolerance;Decreased balance;Decreased mobility;Decreased coordination;Decreased knowledge of use of DME;Decreased safety awareness;Decreased knowledge of precautions;Pain PT Treatment Interventions: DME instruction;Gait training;Stair training;Functional mobility training;Therapeutic activities;Therapeutic exercise;Balance training;Patient/family education     PT Goals(Current goals can be found in the care plan section) Acute Rehab PT Goals Patient Stated Goal: to return home PT Goal Formulation: With patient/family Time For Goal Achievement: 03/08/13 Potential to Achieve Goals: Good  Visit Information  Last PT Received On: 03/05/13 Assistance Needed: +1       Prior Functioning  Home Living Family/patient expects to be discharged to:: Private residence Living Arrangements: Spouse/significant other Available Help at Discharge: Family;Available 24 hours/day Type of Home: House Home Access: Stairs to enter [employer] of Steps: 1 Entrance Stairs-Rails: None Home Layout: One level Home Equipment: Cane - single point Additional Comments: walk in shower, handicapped height toilet Prior Function Level of Independence: Independent Communication Communication: No difficulties    Cognition  Cognition Behavior During Therapy: WFL for tasks assessed/performed Overall Cognitive Status: Within Functional Limits for tasks assessed     Extremity/Trunk Assessment Lower Extremity Assessment Lower Extremity Assessment: RLE deficits/detail RLE Deficits / Details: Pt able to perform slight SLR,  however unable to maintain.  Knee flex to be assessed during pm session with exercises.  Cervical / Trunk Assessment Cervical / Trunk Assessment: Normal   Balance    End of Session PT - End of Session Equipment Utilized During Treatment: Right knee immobilizer Activity Tolerance: Patient tolerated treatment well Patient left: in chair;with call bell/phone within reach;with family/visitor present Nurse Communication: Mobility status  GP     Vista Deck 03/05/2013, 9:57 AM

## 2013-03-06 LAB — CBC
HCT: 33.2 % — ABNORMAL LOW (ref 36.0–46.0)
MCH: 33.1 pg (ref 26.0–34.0)
MCHC: 33.7 g/dL (ref 30.0–36.0)
MCV: 98.2 fL (ref 78.0–100.0)
RBC: 3.38 MIL/uL — ABNORMAL LOW (ref 3.87–5.11)
RDW: 13.8 % (ref 11.5–15.5)

## 2013-03-06 LAB — BASIC METABOLIC PANEL
BUN: 14 mg/dL (ref 6–23)
Creatinine, Ser: 0.57 mg/dL (ref 0.50–1.10)
GFR calc Af Amer: >90 mL/min (ref >90)
GFR calc non Af Amer: 88 mL/min — ABNORMAL LOW (ref >90)
Sodium: 139 mEq/L (ref 135–145)

## 2013-03-06 MED ORDER — OXYCODONE HCL 5 MG PO TABS: 5.0000 mg | ORAL_TABLET | ORAL | Status: DC | PRN

## 2013-03-06 MED ORDER — METHOCARBAMOL 500 MG PO TABS: 500.0000 mg | ORAL_TABLET | Freq: Four times a day (QID) | ORAL | Status: DC | PRN

## 2013-03-06 MED ORDER — RIVAROXABAN 10 MG PO TABS: 10.0000 mg | ORAL_TABLET | Freq: Every day | ORAL | Status: DC

## 2013-03-06 MED ORDER — TRAMADOL HCL 50 MG PO TABS: 50.0000 mg | ORAL_TABLET | Freq: Four times a day (QID) | ORAL | Status: DC | PRN

## 2013-03-06 NOTE — Progress Notes (Signed)
Physical Therapy Treatment Patient Details Name: Linda Parker MRN: [id number] DOB: March 21, 1936 Today's Date: 03/06/2013 Time: 1191-4782 PT Time Calculation (min): 37 min  PT Assessment / Plan / Recommendation  History of Present Illness Pt presents with R TKR   PT Comments   Pt continues to make good progress with all mobility and demonstrates increased ambulation distance and practiced single step to simulate home entry.  Will need to perform therex and may want to practice step again before D/C this afternoon.   Follow Up Recommendations  Home health PT     Does the patient have the potential to tolerate intense rehabilitation     Barriers to Discharge        Equipment Recommendations  Rolling walker with 5" wheels;3in1 (PT)    Recommendations for Other Services    Frequency 7X/week   Progress towards PT Goals Progress towards PT goals: Progressing toward goals  Plan Current plan remains appropriate    Precautions / Restrictions Precautions Precautions: Fall;Knee Required Braces or Orthoses: Knee Immobilizer - Right Knee Immobilizer - Right: Discontinue once straight leg raise with < 10 degree lag Restrictions Weight Bearing Restrictions: No Other Position/Activity Restrictions: WBAT   Pertinent Vitals/Pain 3/10 pain    Mobility  Bed Mobility Bed Mobility: Not assessed (Pt in recliner when PT arrived. ) Transfers Transfers: Sit to Stand;Stand to Sit Sit to Stand: 5: Supervision;With upper extremity assist;With armrests;From chair/3-in-1 Stand to Sit: 5: Supervision;With upper extremity assist;To bed Details for Transfer Assistance: Supervision for safety, pt doing well demonstrating correct and safe technique.  Ambulation/Gait Ambulation/Gait Assistance: 5: Supervision Ambulation Distance (Feet): 150 Feet Assistive device: Rolling walker Ambulation/Gait Assistance Details: Min cues for upright posture and step through vs step to technique.  Gait Pattern: Step-to  pattern;Decreased stride length;Antalgic Gait velocity: decreased Stairs: Yes Stairs Assistance: 4: Min guard Stair Management Technique: No rails;Step to pattern;Backwards;Forwards;With walker Number of Stairs: 1 ( x 2 reps)    Exercises     PT Diagnosis:    PT Problem List:   PT Treatment Interventions:     PT Goals (current goals can now be found in the care plan section) Acute Rehab PT Goals Patient Stated Goal: to return home PT Goal Formulation: With patient/family Time For Goal Achievement: 03/08/13 Potential to Achieve Goals: Good  Visit Information  Last PT Received On: 03/06/13 Assistance Needed: +1 History of Present Illness: Pt presents with R TKR    Subjective Data  Subjective: I want to be seen twice before I go home today.  Patient Stated Goal: to return home   Cognition  Cognition Arousal/Alertness: Awake/alert Behavior During Therapy: WFL for tasks assessed/performed Overall Cognitive Status: Within Functional Limits for tasks assessed    Balance  Balance Balance Assessed: No  End of Session PT - End of Session Equipment Utilized During Treatment: Right knee immobilizer Activity Tolerance: Patient tolerated treatment well Patient left: in bed;with call bell/phone within reach Nurse Communication: Mobility status CPM Right Knee CPM Right Knee: Off   GP     Vista Deck 03/06/2013, 11:28 AM

## 2013-03-06 NOTE — Progress Notes (Signed)
03/06/13 1500  PT Visit Information  Last PT Received On 03/06/13  Assistance Needed +1  History of Present Illness Pt presents with R TKR  PT Time Calculation  PT Start Time 1441  PT Stop Time 1507  PT Time Calculation (min) 26 min  Subjective Data  Subjective I am ready to go  Patient Stated Goal to return home  Precautions  Precautions Fall;Knee  Precaution Comments reviewed donning and doffing KI  Required Braces or Orthoses Knee Immobilizer - Right  Knee Immobilizer - Right Discontinue once straight leg raise with < 10 degree lag  Restrictions  Other Position/Activity Restrictions WBAT  Cognition  Arousal/Alertness Awake/alert  Behavior During Therapy WFL for tasks assessed/performed  Overall Cognitive Status Within Functional Limits for tasks assessed  Bed Mobility  Bed Mobility Supine to Sit  Supine to Sit 4: Min assist  Details for Bed Mobility Assistance assist with RLE  Transfers  Transfers Sit to Stand;Stand to Sit  Sit to Stand 5: Supervision;With upper extremity assist;With armrests;From bed  Stand to Sit 5: Supervision;To chair/3-in-1;With upper extremity assist  Details for Transfer Assistance Supervision for safety, pt doing well demonstrating correct and safe technique.   Ambulation/Gait  Ambulation/Gait Assistance 5: Supervision  Ambulation Distance (Feet) 15 Feet  Assistive device Rolling walker  Ambulation/Gait Assistance Details cues for RW safety  Gait Pattern Step-to pattern;Decreased stride length;Antalgic  Gait velocity decreased  Number of Stairs (pt and husb verbalize technique, decline practice)  Total Joint Exercises  Ankle Circles/Pumps AROM;Both;20 reps  Quad Sets AROM;Right;10 reps  Heel Slides AAROM;Right;10 reps  Hip ABduction/ADduction AAROM;Right;10 reps  Straight Leg Raises AAROM;Right;10 reps  Goniometric ROM 64*  PT - End of Session  Equipment Utilized During Treatment Right knee immobilizer  Activity Tolerance Patient tolerated  treatment well  Patient left Other (comment) (w/c with nurse tech)  Nurse Communication Mobility status  PT - Assessment/Plan  PT Plan Current plan remains appropriate  PT Frequency 7X/week  Follow Up Recommendations Home health PT  PT Goal Progression  Progress towards PT goals Progressing toward goals  Acute Rehab PT Goals  Time For Goal Achievement 03/08/13  PT General Charges  $$ ACUTE PT VISIT 1 Procedure  PT Treatments  $Therapeutic Exercise 8-22 mins  $Therapeutic Activity 8-22 mins

## 2013-03-06 NOTE — Discharge Summary (Signed)
Physician Discharge Summary   Patient ID: Linda Parker MRN: [id number] DOB/AGE: 75-Jan-1937 75 y.o.  Admit date: 03/04/2013 Discharge date: 03/06/2013  Primary Diagnosis:  Osteoarthritis Bilateral knee(s)  Admission Diagnoses:  Past Medical History  Diagnosis Date  . Unspecified hypothyroidism   . Aortic stenosis   . Palpitations   . Dysrhythmia     when taking antihistamines  . Arthritis    Discharge Diagnoses:   Principal Problem:   OA (osteoarthritis) of knee Active Problems:   Postoperative anemia due to acute blood loss  Estimated body mass index is 23.57 kg/(m^2) as calculated from the following:   Height as of this encounter: 5\' 3"  (1.6 m).   Weight as of this encounter: 60.328 kg (133 lb).  Procedure:  Procedure(s) (LRB): RIGHT TOTAL KNEE ARTHROPLASTY (Right) left KNEE cortisone INJECTION (Left)   Consults: None  HPI: Linda Parker is a 77 y.o. year old female with end stage OA of her right knee with progressively worsening pain and dysfunction. She has constant pain, with activity and at rest and significant functional deficits with difficulties even with ADLs. She has had extensive non-op management including analgesics, injections of cortisone, and home exercise program, but remains in significant pain with significant dysfunction.Radiographs show bone on bone arthritis medial and patellofemoral. She presents now for right Total Knee Arthroplasty.   Laboratory Data: Admission on 03/04/2013, Discharged on 03/06/2013  Component Date Value Range Status  . ABO/RH(D) 03/04/2013 O NEG   Final  . Antibody Screen 03/04/2013 NEG   Final  . Sample Expiration 03/04/2013 03/07/2013   Final  . ABO/RH(D) 03/04/2013 O NEG   Final  . WBC 03/05/2013 15.0* 4.0 - 10.5 K/uL Final  . RBC 03/05/2013 3.42* 3.87 - 5.11 MIL/uL Final  . Hemoglobin 03/05/2013 11.2* 12.0 - 15.0 g/dL Final  . HCT 62/95/2841 33.2* 36.0 - 46.0 % Final  . MCV 03/05/2013 97.1  78.0 - 100.0 fL Final  .  MCH 03/05/2013 32.7  26.0 - 34.0 pg Final  . MCHC 03/05/2013 33.7  30.0 - 36.0 g/dL Final  . RDW 32/44/0102 13.6  11.5 - 15.5 % Final  . Platelets 03/05/2013 244  150 - 400 K/uL Final  . Sodium 03/05/2013 136  135 - 145 mEq/L Final  . Potassium 03/05/2013 4.0  3.5 - 5.1 mEq/L Final  . Chloride 03/05/2013 103  96 - 112 mEq/L Final  . CO2 03/05/2013 24  19 - 32 mEq/L Final  . Glucose, Bld 03/05/2013 145* 70 - 99 mg/dL Final  . BUN 72/53/6644 9  6 - 23 mg/dL Final  . Creatinine, Ser 03/05/2013 0.65  0.50 - 1.10 mg/dL Final  . Calcium 03/47/4259 9.3  8.4 - 10.5 mg/dL Final  . GFR calc non Af Amer 03/05/2013 85* >90 mL/min Final  . GFR calc Af Amer 03/05/2013 >90  >90 mL/min Final   Comment: (NOTE)                          The eGFR has been calculated using the CKD EPI equation.                          This calculation has not been validated in all clinical situations.                          eGFR's persistently <90 mL/min signify possible Chronic Kidney  Disease.  . WBC 03/06/2013 21.1* 4.0 - 10.5 K/uL Final  . RBC 03/06/2013 3.38* 3.87 - 5.11 MIL/uL Final  . Hemoglobin 03/06/2013 11.2* 12.0 - 15.0 g/dL Final  . HCT 08/65/7846 33.2* 36.0 - 46.0 % Final  . MCV 03/06/2013 98.2  78.0 - 100.0 fL Final  . MCH 03/06/2013 33.1  26.0 - 34.0 pg Final  . MCHC 03/06/2013 33.7  30.0 - 36.0 g/dL Final  . RDW 96/29/5284 13.8  11.5 - 15.5 % Final  . Platelets 03/06/2013 257  150 - 400 K/uL Final  . Sodium 03/06/2013 139  135 - 145 mEq/L Final  . Potassium 03/06/2013 4.1  3.5 - 5.1 mEq/L Final  . Chloride 03/06/2013 103  96 - 112 mEq/L Final  . CO2 03/06/2013 29  19 - 32 mEq/L Final  . Glucose, Bld 03/06/2013 102* 70 - 99 mg/dL Final  . BUN 13/24/4010 14  6 - 23 mg/dL Final  . Creatinine, Ser 03/06/2013 0.57  0.50 - 1.10 mg/dL Final  . Calcium 27/25/3664 9.8  8.4 - 10.5 mg/dL Final  . GFR calc non Af Amer 03/06/2013 88* >90 mL/min Final  . GFR calc Af Amer 03/06/2013 >90   >90 mL/min Final   Comment: (NOTE)                          The eGFR has been calculated using the CKD EPI equation.                          This calculation has not been validated in all clinical situations.                          eGFR's persistently <90 mL/min signify possible Chronic Kidney                          Disease.  Hospital Outpatient Visit on 02/19/2013  Component Date Value Range Status  . MRSA, PCR 02/19/2013 NEGATIVE  NEGATIVE Final  . Staphylococcus aureus 02/19/2013 NEGATIVE  NEGATIVE Final   Comment:                                 The Xpert SA Assay (FDA                          approved for NASAL specimens                          in patients over 37 years of age),                          is one component of                          a comprehensive surveillance                          program.  Test performance has                          been validated by [employer]  [facility] for patients greater                          than or equal to 58 year old.                          It is not intended                          to diagnose infection nor to                          guide or monitor treatment.  Marland Kitchen aPTT 02/19/2013 31  24 - 37 seconds Final  . WBC 02/19/2013 7.2  4.0 - 10.5 K/uL Final  . RBC 02/19/2013 4.25  3.87 - 5.11 MIL/uL Final  . Hemoglobin 02/19/2013 13.8  12.0 - 15.0 g/dL Final  . HCT 16/01/9603 41.2  36.0 - 46.0 % Final  . MCV 02/19/2013 96.9  78.0 - 100.0 fL Final  . MCH 02/19/2013 32.5  26.0 - 34.0 pg Final  . MCHC 02/19/2013 33.5  30.0 - 36.0 g/dL Final  . RDW 54/12/8117 13.2  11.5 - 15.5 % Final  . Platelets 02/19/2013 287  150 - 400 K/uL Final  . Sodium 02/19/2013 138  135 - 145 mEq/L Final  . Potassium 02/19/2013 4.2  3.5 - 5.1 mEq/L Final  . Chloride 02/19/2013 103  96 - 112 mEq/L Final  . CO2 02/19/2013 27  19 - 32 mEq/L Final  . Glucose, Bld 02/19/2013 93  70 - 99 mg/dL Final  . BUN 14/78/2956 24* 6 - 23 mg/dL  Final  . Creatinine, Ser 02/19/2013 0.73  0.50 - 1.10 mg/dL Final  . Calcium 21/30/8657 10.3  8.4 - 10.5 mg/dL Final  . Total Protein 02/19/2013 7.9  6.0 - 8.3 g/dL Final  . Albumin 84/69/6295 4.3  3.5 - 5.2 g/dL Final  . AST 28/41/3244 15  0 - 37 U/L Final  . ALT 02/19/2013 13  0 - 35 U/L Final  . Alkaline Phosphatase 02/19/2013 88  39 - 117 U/L Final  . Total Bilirubin 02/19/2013 0.2* 0.3 - 1.2 mg/dL Final  . GFR calc non Af Amer 02/19/2013 82* >90 mL/min Final  . GFR calc Af Amer 02/19/2013 >90  >90 mL/min Final   Comment: (NOTE)                          The eGFR has been calculated using the CKD EPI equation.                          This calculation has not been validated in all clinical situations.                          eGFR's persistently <90 mL/min signify possible Chronic Kidney                          Disease.  Marland Kitchen Prothrombin Time 02/19/2013 12.2  11.6 - 15.2 seconds Final  . INR 02/19/2013 0.92  0.00 - 1.49 Final  . Color, Urine 02/19/2013 YELLOW  YELLOW Final  . APPearance 02/19/2013 CLOUDY* CLEAR Final  . Specific Gravity, Urine 02/19/2013 1.016  1.005 - 1.030 Final  .  pH 02/19/2013 7.0  5.0 - 8.0 Final  . Glucose, UA 02/19/2013 NEGATIVE  NEGATIVE mg/dL Final  . Hgb urine dipstick 02/19/2013 NEGATIVE  NEGATIVE Final  . Bilirubin Urine 02/19/2013 NEGATIVE  NEGATIVE Final  . Ketones, ur 02/19/2013 NEGATIVE  NEGATIVE mg/dL Final  . Protein, ur 16/01/9603 NEGATIVE  NEGATIVE mg/dL Final  . Urobilinogen, UA 02/19/2013 0.2  0.0 - 1.0 mg/dL Final  . Nitrite 54/12/8117 NEGATIVE  NEGATIVE Final  . Leukocytes, UA 02/19/2013 NEGATIVE  NEGATIVE Final   MICROSCOPIC NOT DONE ON URINES WITH NEGATIVE PROTEIN, BLOOD, LEUKOCYTES, NITRITE, OR GLUCOSE <1000 mg/dL.     X-Rays:No results found.  EKG: Orders placed in visit on 09/05/12  . EKG 12-LEAD     Hospital Course: GABRILLE KILBRIDE is a 77 y.o. who was admitted to [facility]. They were brought to the operating room on  03/04/2013 and underwent Procedure(s): RIGHT TOTAL KNEE ARTHROPLASTY left KNEE cortisone INJECTION.  Patient tolerated the procedure well and was later transferred to the recovery room and then to the orthopaedic floor for postoperative care.  They were given PO and IV analgesics for pain control following their surgery.  They were given 24 hours of postoperative antibiotics of  Anti-infectives   Start     Dose/Rate Route Frequency Ordered Stop   03/04/13 1300  ceFAZolin (ANCEF) IVPB 1 g/50 mL premix     1 g 100 mL/hr over 30 Minutes Intravenous Every 6 hours 03/04/13 1031 03/04/13 1906   03/04/13 0600  ceFAZolin (ANCEF) IVPB 2 g/50 mL premix     2 g 100 mL/hr over 30 Minutes Intravenous On call to O.R. 03/04/13 1478 03/04/13 0707     and started on DVT prophylaxis in the form of Xarelto.   PT and OT were ordered for total joint protocol.  Discharge planning consulted to help with postop disposition and equipment needs.  Patient had a decent night on the evening of surgery.  They started to get up OOB with therapy on day one walking about 40 feet and then 60 feet. Hemovac drain was pulled without difficulty.  Continued to work with therapy into day two.  Dressing was changed on day two and the incision was healing well.  Patient was seen in rounds and was ready to go home later that day following therapy.   Discharge Medications: Prior to Admission medications   Medication Sig Start Date End Date Taking? Authorizing Provider  levothyroxine (SYNTHROID, LEVOTHROID) 50 MCG tablet Take 50 mcg by mouth daily before breakfast.   Yes Historical Provider, MD  methocarbamol (ROBAXIN) 500 MG tablet Take 1 tablet (500 mg total) by mouth every 6 (six) hours as needed for muscle spasms. 03/06/13   Alexzandrew Perkins, PA-C  oxyCODONE (OXY IR/ROXICODONE) 5 MG immediate release tablet Take 1-2 tablets (5-10 mg total) by mouth every 3 (three) hours as needed for breakthrough pain. 03/06/13   Alexzandrew Julien Girt,  PA-C  rivaroxaban (XARELTO) 10 MG TABS tablet Take 1 tablet (10 mg total) by mouth daily with breakfast. Take Xarelto for two and a half more weeks, then discontinue Xarelto. Once the patient has completed the Xarelto, they may resume the 81 mg Aspirin. 03/06/13   Alexzandrew Perkins, PA-C  traMADol (ULTRAM) 50 MG tablet Take 1-2 tablets (50-100 mg total) by mouth every 6 (six) hours as needed (mild pain). 03/06/13   Alexzandrew Julien Girt, PA-C   Discharge home with home health  Diet - Regular diet  Follow up - in 2 weeks  Activity - WBAT  Disposition - Home  Condition Upon Discharge - Good  D/C Meds - See DC Summary  DVT Prophylaxis - Xarelto       Discharge Orders   Future Orders Complete By Expires   Call MD / Call 911  As directed    Comments:     If you experience chest pain or shortness of breath, CALL 911 and be transported to the hospital emergency room.  If you develope a fever above 101 F, pus (white drainage) or increased drainage or redness at the wound, or calf pain, call your surgeon's office.   Change dressing  As directed    Comments:     Change dressing daily with sterile 4 x 4 inch gauze dressing and apply TED hose. Do not submerge the incision under water.   Constipation Prevention  As directed    Comments:     Drink plenty of fluids.  Prune juice may be helpful.  You may use a stool softener, such as Colace (over the counter) 100 mg twice a day.  Use MiraLax (over the counter) for constipation as needed.   Diet general  As directed    Discharge instructions  As directed    Comments:     Pick up stool softner and laxative for home. Do not submerge incision under water. May shower. Continue to use ice for pain and swelling from surgery.  Take Xarelto for two and a half more weeks, then discontinue Xarelto. Once the patient has completed the Xarelto, they may resume the 81 mg Aspirin.   Do not put a pillow under the knee. Place it under the heel.  As directed     Do not sit on low chairs, stoools or toilet seats, as it may be difficult to get up from low surfaces  As directed    Driving restrictions  As directed    Comments:     No driving until released by the physician.   Increase activity slowly as tolerated  As directed    Lifting restrictions  As directed    Comments:     No lifting until released by the physician.   Patient may shower  As directed    Comments:     You may shower without a dressing once there is no drainage.  Do not wash over the wound.  If drainage remains, do not shower until drainage stops.   TED hose  As directed    Comments:     Use stockings (TED hose) for 3 weeks on both leg(s).  You may remove them at night for sleeping.   Weight bearing as tolerated  As directed    Questions:     Laterality:     Extremity:         Medication List    STOP taking these medications       aspirin 81 MG tablet     Calcium-Magnesium 500-250 MG Tabs     estradiol 0.5 MG tablet  Commonly known as:  ESTRACE     FISH OIL PO     FLAX PO     folic acid 800 MCG tablet  Commonly known as:  FOLVITE     glucosamine-chondroitin 500-400 MG tablet     MSM 1000 MG Caps     OVER THE COUNTER MEDICATION     Vitamin C Powd     VITAMIN D PO     VITAMIN E PO  TAKE these medications       levothyroxine 50 MCG tablet  Commonly known as:  SYNTHROID, LEVOTHROID  Take 50 mcg by mouth daily before breakfast.     methocarbamol 500 MG tablet  Commonly known as:  ROBAXIN  Take 1 tablet (500 mg total) by mouth every 6 (six) hours as needed for muscle spasms.     oxyCODONE 5 MG immediate release tablet  Commonly known as:  Oxy IR/ROXICODONE  Take 1-2 tablets (5-10 mg total) by mouth every 3 (three) hours as needed for breakthrough pain.     rivaroxaban 10 MG Tabs tablet  Commonly known as:  XARELTO  - Take 1 tablet (10 mg total) by mouth daily with breakfast. Take Xarelto for two and a half more weeks, then discontinue  Xarelto.  - Once the patient has completed the Xarelto, they may resume the 81 mg Aspirin.     traMADol 50 MG tablet  Commonly known as:  ULTRAM  Take 1-2 tablets (50-100 mg total) by mouth every 6 (six) hours as needed (mild pain).       Follow-up Information   Follow up with Loanne Drilling, MD. Schedule an appointment as soon as possible for a visit in 2 weeks.   Specialty:  Orthopedic Surgery   Contact information:   [address] [place] [address] [phone]       Signed: Patrica Duel 03/11/2013, 7:43 AM

## 2013-03-06 NOTE — Progress Notes (Signed)
   Subjective: 2 Days Post-Op Procedure(s) (LRB): RIGHT TOTAL KNEE ARTHROPLASTY (Right) left KNEE cortisone INJECTION (Left) Patient reports pain as mild and moderate.   Patient seen in rounds for Dr. Lequita Halt. Patient is well, and has had no acute complaints or problems Patient is ready to go home today  Objective: Vital signs in last 24 hours: Temp:  [98.2 F (36.8 C)-98.3 F (36.8 C)] 98.2 F (36.8 C) (11/12 0531) Pulse Rate:  [76-80] 80 (11/12 0531) Resp:  [14-16] 15 (11/12 0800) BP: (125-141)/(72-80) 125/75 mmHg (11/12 0531) SpO2:  [97 %-98 %] 98 % (11/12 0531)  Intake/Output from previous day:  Intake/Output Summary (Last 24 hours) at 03/06/13 1036 Last data filed at 03/06/13 0700  Gross per 24 hour  Intake    880 ml  Output   2600 ml  Net  -1720 ml    Intake/Output this shift:    Labs:  Recent Labs  03/05/13 0403 03/06/13 0408  HGB 11.2* 11.2*    Recent Labs  03/05/13 0403 03/06/13 0408  WBC 15.0* 21.1*  RBC 3.42* 3.38*  HCT 33.2* 33.2*  PLT 244 257    Recent Labs  03/05/13 0403 03/06/13 0408  NA 136 139  K 4.0 4.1  CL 103 103  CO2 24 29  BUN 9 14  CREATININE 0.65 0.57  GLUCOSE 145* 102*  CALCIUM 9.3 9.8   No results found for this basename: LABPT, INR,  in the last 72 hours  EXAM: General - Patient is Alert, Appropriate and Oriented Extremity - Neurovascular intact Sensation intact distally Dorsiflexion/Plantar flexion intact No cellulitis present Incision - clean, dry, no drainage, healing Motor Function - intact, moving foot and toes well on exam.   Assessment/Plan: 2 Days Post-Op Procedure(s) (LRB): RIGHT TOTAL KNEE ARTHROPLASTY (Right) left KNEE cortisone INJECTION (Left) Procedure(s) (LRB): RIGHT TOTAL KNEE ARTHROPLASTY (Right) left KNEE cortisone INJECTION (Left) Past Medical History  Diagnosis Date  . Unspecified hypothyroidism   . Aortic stenosis   . Palpitations   . Dysrhythmia     when taking antihistamines  .  Arthritis    Principal Problem:   OA (osteoarthritis) of knee Active Problems:   Postoperative anemia due to acute blood loss  Estimated body mass index is 23.57 kg/(m^2) as calculated from the following:   Height as of this encounter: 5\' 3"  (1.6 m).   Weight as of this encounter: 60.328 kg (133 lb). Up with therapy Discharge home with home health Diet - Regular diet Follow up - in 2 weeks Activity - WBAT Disposition - Home Condition Upon Discharge - Good D/C Meds - See DC Summary DVT Prophylaxis - Xarelto  Linda Parker 03/06/2013, 10:36 AM

## 2013-03-06 NOTE — Progress Notes (Signed)
Occupational Therapy Treatment Patient Details Name: Linda Parker MRN: [id number] DOB: Oct 07, 1935 Today's Date: 03/06/2013 Time: 7829-5621 OT Time Calculation (min): 24 min  OT Assessment / Plan / Recommendation  History of present illness Pt presents with R TKR   OT comments  Pt doing well and is supposed to d/c home today.  Follow Up Recommendations  No OT follow up;Supervision/Assistance - 24 hour    Barriers to Discharge       Equipment Recommendations  3 in 1 bedside comode    Recommendations for Other Services    Frequency Min 2X/week   Progress towards OT Goals Progress towards OT goals: Progressing toward goals  Plan Discharge plan remains appropriate    Precautions / Restrictions Precautions Precautions: Fall;Knee Required Braces or Orthoses: Knee Immobilizer - Right Knee Immobilizer - Right: Discontinue once straight leg raise with < 10 degree lag Restrictions Weight Bearing Restrictions: No Other Position/Activity Restrictions: WBAT   Pertinent Vitals/Pain 3/10; reposition, ice    ADL  Toilet Transfer: Min guard Toilet Transfer Method: Stand pivot (up to bathroom 3in1) Toilet Transfer Equipment: Raised toilet seat with arms (or 3-in-1 over toilet) Toileting - Clothing Manipulation and Hygiene: Min guard Where Assessed - Toileting Clothing Manipulation and Hygiene: Sit to stand from 3-in-1 or toilet Tub/Shower Transfer: Minimal assistance Tub/Shower Transfer Method:  (step back over ledge) Equipment Used: Rolling walker;Knee Immobilizer ADL Comments: Husband present. Discussed at length placement of 3in1 in shower for safety and use of RW if it will fit and also if it doesnt fit in shower. Issued shower transfer handout. She was min assist to step over ledge and pt states she will likely sponge bathe another day or two before showering. Discussed KI use and when to wear. Husband will be assisting with LB ADL.    OT Diagnosis:    OT Problem List:   OT  Treatment Interventions:     OT Goals(current goals can now be found in the care plan section)    Visit Information  Last OT Received On: 03/06/13 Assistance Needed: +1 History of Present Illness: Pt presents with R TKR    Subjective Data      Prior Functioning       Cognition  Cognition Arousal/Alertness: Awake/alert Behavior During Therapy: WFL for tasks assessed/performed Overall Cognitive Status: Within Functional Limits for tasks assessed    Mobility  Transfers Transfers: Sit to Stand;Stand to Sit Sit to Stand: 4: Min guard;With upper extremity assist;From chair/3-in-1 Stand to Sit: 4: Min guard;With upper extremity assist    Exercises      Balance     End of Session OT - End of Session Equipment Utilized During Treatment: Right knee immobilizer;Rolling walker Activity Tolerance: Patient tolerated treatment well Patient left: in chair;with call bell/phone within reach;with family/visitor present  GO     Lennox Laity [phone] 03/06/2013, 10:16 AM

## 2013-03-08 NOTE — Progress Notes (Signed)
Utilization review completed.  

## 2013-06-17 ENCOUNTER — Telehealth: Payer: Self-pay | Admitting: Cardiology

## 2013-06-17 DIAGNOSIS — I359 Nonrheumatic aortic valve disorder, unspecified: Secondary | ICD-10-CM

## 2013-06-17 NOTE — Telephone Encounter (Signed)
Will forward to Dr Stanford Breed to see if he wants the pt to have an echo this year prior to seeing him

## 2013-06-17 NOTE — Telephone Encounter (Signed)
New Problem:  Pt wants to know if she needs an Echo appt this year. Pt states she usually has one done every year... However there is not an order or recall for one. Pt would like a call back.

## 2013-06-17 NOTE — Telephone Encounter (Signed)
Fu echo may 2015. [name]

## 2013-06-18 NOTE — Telephone Encounter (Signed)
Spoke with pt, echo scheduled prior to appt in may

## 2013-08-12 ENCOUNTER — Telehealth: Payer: Self-pay | Admitting: Cardiology

## 2013-08-12 NOTE — Telephone Encounter (Signed)
Lasix 20 mg daily with bmet one week; fu as scheduled. Linda Parker

## 2013-08-12 NOTE — Telephone Encounter (Signed)
Spoke with pt, Aware of dr Jacalyn Lefevre recommendations.  She does not really want to use a fluid pill at this time. She reports getting up to the bathroom 2-3 times at night already. She does report using salt. Pt will make sure to wear her support hose daily and will stop her salt. She will let us know if does not improve or if symptoms change. Pt agreed with this plan.

## 2013-08-12 NOTE — Telephone Encounter (Signed)
Pt called states that her legs are swelling. Requests a call back to discuss if an earlier appt is needed. Please assist.

## 2013-08-12 NOTE — Telephone Encounter (Signed)
Spoke with pt, she had a knee replacement in 02/2013 and for the last several months has noticed swelling in her legs and feet. She reports if she is on her feet for 4 hours, by the end of the day her legs will swell from the knees down to her ankles. She was recently seen by the orthopedist and he told her to contact us. Her legs are not swollen in the am and she reports no SOB. She will have a rare episode of heart racing. If she wears he support stockings it will help with the edema. She is concerned this maybe related to her AVD. She has a follow up echo in may and appt in June. She wants to make sure that will be okay. Will forward for dr Stanford Breed review

## 2013-09-02 ENCOUNTER — Ambulatory Visit (HOSPITAL_COMMUNITY): Payer: Medicare Other | Attending: Cardiology | Admitting: Radiology

## 2013-09-02 DIAGNOSIS — I359 Nonrheumatic aortic valve disorder, unspecified: Secondary | ICD-10-CM | POA: Insufficient documentation

## 2013-09-02 NOTE — Progress Notes (Signed)
Echocardiogram performed.  

## 2013-09-09 ENCOUNTER — Encounter: Payer: Self-pay | Admitting: Cardiology

## 2013-09-09 ENCOUNTER — Encounter: Payer: Self-pay | Admitting: *Deleted

## 2013-09-09 ENCOUNTER — Telehealth: Payer: Self-pay | Admitting: Cardiology

## 2013-09-09 ENCOUNTER — Other Ambulatory Visit: Payer: Self-pay | Admitting: Cardiology

## 2013-09-09 ENCOUNTER — Ambulatory Visit (INDEPENDENT_AMBULATORY_CARE_PROVIDER_SITE_OTHER): Payer: Medicare Other | Admitting: Cardiology

## 2013-09-09 VITALS — BP 120/80 | HR 75 | Ht 63.5 in | Wt 126.0 lb

## 2013-09-09 DIAGNOSIS — I359 Nonrheumatic aortic valve disorder, unspecified: Secondary | ICD-10-CM

## 2013-09-09 DIAGNOSIS — E039 Hypothyroidism, unspecified: Secondary | ICD-10-CM

## 2013-09-09 LAB — CBC WITH DIFFERENTIAL/PLATELET
BASOS ABS: 0 10*3/uL (ref 0.0–0.1)
Basophils Relative: 0.5 % (ref 0.0–3.0)
EOS PCT: 7.9 % — AB (ref 0.0–5.0)
Eosinophils Absolute: 0.6 10*3/uL (ref 0.0–0.7)
HEMATOCRIT: 40.6 % (ref 36.0–46.0)
Hemoglobin: 13.5 g/dL (ref 12.0–15.0)
LYMPHS ABS: 2 10*3/uL (ref 0.7–4.0)
Lymphocytes Relative: 27.6 % (ref 12.0–46.0)
MCHC: 33.2 g/dL (ref 30.0–36.0)
MCV: 99.1 fl (ref 78.0–100.0)
MONO ABS: 0.8 10*3/uL (ref 0.1–1.0)
Monocytes Relative: 11.1 % (ref 3.0–12.0)
Neutro Abs: 3.9 10*3/uL (ref 1.4–7.7)
Neutrophils Relative %: 52.9 % (ref 43.0–77.0)
Platelets: 267 10*3/uL (ref 150.0–400.0)
RBC: 4.1 Mil/uL (ref 3.87–5.11)
RDW: 13.7 % (ref 11.5–15.5)
WBC: 7.3 10*3/uL (ref 4.0–10.5)

## 2013-09-09 LAB — BASIC METABOLIC PANEL
BUN: 19 mg/dL (ref 6–23)
CHLORIDE: 104 meq/L (ref 96–112)
CO2: 26 mEq/L (ref 19–32)
Calcium: 9.6 mg/dL (ref 8.4–10.5)
Creatinine, Ser: 0.8 mg/dL (ref 0.4–1.2)
GFR: 79.88 mL/min (ref >60.00)
GLUCOSE: 91 mg/dL (ref 70–99)
POTASSIUM: 4 meq/L (ref 3.5–5.1)
Sodium: 138 mEq/L (ref 135–145)

## 2013-09-09 NOTE — Telephone Encounter (Signed)
Spoke with pt, aware she can cont to wear her compression hose.

## 2013-09-09 NOTE — Assessment & Plan Note (Signed)
Patient's followup echocardiogram shows severe aortic stenosis.She has developed mild bilateral lower extremity edema during the day that improves overnight. She had an episode of chest tightness with activities relieved with rest. I think we have reached the point of aortic valve replacement. I discussed this in detail today. Plan right and left cardiac catheterization prior to surgery. The risks and benefits were discussed and the patient agrees to proceed. I will also arrange an appointment with cardiothoracic surgery for aortic valve replacement. Begin aspirin 81 mg daily.

## 2013-09-09 NOTE — Assessment & Plan Note (Signed)
Continue Synthroid °

## 2013-09-09 NOTE — Progress Notes (Signed)
      HPI: FU AS. Previous event monitor showed sinus rhythm with rare PAC and PVC but she did not have palpitations while wearing the monitor. Nuclear study in May of 2013 showed an ejection fraction of 77% and normal perfusion. Last echo in May of 2015 and showed normal LV function, severe AS (mean gradient 49 mmHg) and mild AI. Since I last saw her, For the past one and one half months she has noticed some bilateral pedal edema worse during the day improved overnight. She had an episode of chest tightness while running to the bathroom one afternoon. Relieved with rest. No syncope.   Current Outpatient Prescriptions  Medication Sig Dispense Refill  . estradiol (VIVELLE-DOT) 0.025 MG/24HR Place 1 patch onto the skin 2 (two) times a week.      . levothyroxine (SYNTHROID, LEVOTHROID) 50 MCG tablet Take 50 mcg by mouth daily before breakfast.       No current facility-administered medications for this visit.     Past Medical History  Diagnosis Date  . Unspecified hypothyroidism   . Aortic stenosis   . Palpitations   . Dysrhythmia     when taking antihistamines  . Arthritis     Past Surgical History  Procedure Laterality Date  . Finger surgery      left hand  . Bilateral knee surgery  35 yrs. ago    cartilage removed from knees  . Abdominal hysterectomy    . Tonsillectomy    . Appendectomy      at time of hysterectomy  . Total knee arthroplasty Right 03/04/2013    Procedure: RIGHT TOTAL KNEE ARTHROPLASTY;  Surgeon: Gearlean Alf, MD;  Location: [facility];  Service: Orthopedics;  Laterality: Right;  . Injection knee Left 03/04/2013    Procedure: left KNEE cortisone INJECTION;  Surgeon: Gearlean Alf, MD;  Location: [facility];  Service: Orthopedics;  Laterality: Left;    History   Social History  . Marital Status: Married    Spouse Name: N/A    Number of Children: N/A  . Years of Education: N/A   Occupational History  . Not on file.   Social History Main Topics  .  Smoking status: Never Smoker   . Smokeless tobacco: Not on file  . Alcohol Use: 0.6 oz/week    1 Glasses of wine per week     Comment: 4 oz nightly red wine  . Drug Use: No  . Sexual Activity: Not on file   Other Topics Concern  . Not on file   Social History Narrative  . No narrative on file    ROS: no fevers or chills, productive cough, hemoptysis, dysphasia, odynophagia, melena, hematochezia, dysuria, hematuria, rash, seizure activity, orthopnea, PND, pedal edema, claudication. Remaining systems are negative.  Physical Exam: Well-developed well-nourished in no acute distress.  Skin is warm and dry.  HEENT is normal.  Neck is supple.  Chest is clear to auscultation with normal expansion.  Cardiovascular exam is regular rate and rhythm. 2/6 systolic murmur left sternal border. Abdominal exam nontender or distended. No masses palpated. Extremities show no edema. neuro grossly intact  ECG Sinus rhythm at a rate of 76. Left axis deviation.

## 2013-09-09 NOTE — Telephone Encounter (Signed)
New problem    Pt called back she forgot to ask about her compression hose.  Should she wear them all the time?

## 2013-09-09 NOTE — Patient Instructions (Signed)
Your physician has requested that you have a cardiac catheterization. Cardiac catheterization is used to diagnose and/or treat various heart conditions. Doctors may recommend this procedure for a number of different reasons. The most common reason is to evaluate chest pain. Chest pain can be a symptom of coronary artery disease (CAD), and cardiac catheterization can show whether plaque is narrowing or blocking your heart's arteries. This procedure is also used to evaluate the valves, as well as measure the blood flow and oxygen levels in different parts of your heart. For further information please visit [email or web address]. Please follow instruction sheet, as given.   Your physician recommends that you HAVE LAB WORK TODAY  REFERRAL TO [facility] FOR AVR

## 2013-09-10 ENCOUNTER — Encounter (HOSPITAL_COMMUNITY): Payer: Self-pay | Admitting: Pharmacy Technician

## 2013-09-10 LAB — PROTIME-INR
INR: 0.9 ratio (ref 0.8–1.0)
Prothrombin Time: 10.2 s (ref 9.6–13.1)

## 2013-09-17 ENCOUNTER — Ambulatory Visit (HOSPITAL_COMMUNITY)
Admission: RE | Admit: 2013-09-17 | Discharge: 2013-09-17 | Disposition: A | Discharge status: Home / Self Care | Payer: Medicare Other | Source: Ambulatory Visit | Attending: Cardiovascular Disease | Admitting: Cardiovascular Disease

## 2013-09-17 ENCOUNTER — Encounter (HOSPITAL_COMMUNITY)
Admission: RE | Disposition: A | Discharge status: Home / Self Care | Payer: Self-pay | Source: Ambulatory Visit | Attending: Cardiovascular Disease

## 2013-09-17 DIAGNOSIS — Z96659 Presence of unspecified artificial knee joint: Secondary | ICD-10-CM | POA: Insufficient documentation

## 2013-09-17 DIAGNOSIS — I2510 Atherosclerotic heart disease of native coronary artery without angina pectoris: Secondary | ICD-10-CM

## 2013-09-17 DIAGNOSIS — E039 Hypothyroidism, unspecified: Secondary | ICD-10-CM | POA: Insufficient documentation

## 2013-09-17 DIAGNOSIS — I2584 Coronary atherosclerosis due to calcified coronary lesion: Secondary | ICD-10-CM | POA: Insufficient documentation

## 2013-09-17 DIAGNOSIS — I359 Nonrheumatic aortic valve disorder, unspecified: Secondary | ICD-10-CM

## 2013-09-17 DIAGNOSIS — M129 Arthropathy, unspecified: Secondary | ICD-10-CM | POA: Insufficient documentation

## 2013-09-17 HISTORY — PX: LEFT AND RIGHT HEART CATHETERIZATION WITH CORONARY ANGIOGRAM: SHX5449

## 2013-09-17 LAB — POCT I-STAT 3, ART BLOOD GAS (G3+)
BICARBONATE: 24.9 meq/L — AB (ref 20.0–24.0)
O2 Saturation: 94 %
PH ART: 7.408 (ref 7.350–7.450)
PO2 ART: 70 mmHg — AB (ref 80.0–100.0)
TCO2: 26 mmol/L (ref 0–100)
pCO2 arterial: 39.4 mmHg (ref 35.0–45.0)

## 2013-09-17 LAB — POCT I-STAT 3, VENOUS BLOOD GAS (G3P V)
BICARBONATE: 25.5 meq/L — AB (ref 20.0–24.0)
O2 SAT: 77 %
PO2 VEN: 42 mmHg (ref 30.0–45.0)
TCO2: 27 mmol/L (ref 0–100)
pCO2, Ven: 42 mmHg — ABNORMAL LOW (ref 45.0–50.0)
pH, Ven: 7.392 — ABNORMAL HIGH (ref 7.250–7.300)

## 2013-09-17 LAB — POCT ACTIVATED CLOTTING TIME: Activated Clotting Time: 166 seconds

## 2013-09-17 SURGERY — LEFT AND RIGHT HEART CATHETERIZATION WITH CORONARY ANGIOGRAM
Anesthesia: LOCAL

## 2013-09-17 MED ORDER — FENTANYL CITRATE 0.05 MG/ML IJ SOLN
INTRAMUSCULAR | Status: AC
  Filled 2013-09-17: qty 2

## 2013-09-17 MED ORDER — ONDANSETRON HCL 4 MG/2ML IJ SOLN: 4.0000 mg | Freq: Four times a day (QID) | INTRAMUSCULAR | Status: DC | PRN

## 2013-09-17 MED ORDER — ASPIRIN 81 MG PO CHEW
CHEWABLE_TABLET | ORAL | Status: AC
  Filled 2013-09-17: qty 1

## 2013-09-17 MED ORDER — SODIUM CHLORIDE 0.9 % IJ SOLN: 3.0000 mL | Freq: Two times a day (BID) | INTRAMUSCULAR | Status: DC

## 2013-09-17 MED ORDER — VERAPAMIL HCL 2.5 MG/ML IV SOLN
INTRAVENOUS | Status: AC
  Filled 2013-09-17: qty 2

## 2013-09-17 MED ORDER — MIDAZOLAM HCL 2 MG/2ML IJ SOLN
INTRAMUSCULAR | Status: AC
  Filled 2013-09-17: qty 2

## 2013-09-17 MED ORDER — LIDOCAINE HCL (PF) 1 % IJ SOLN
INTRAMUSCULAR | Status: AC
  Filled 2013-09-17: qty 30

## 2013-09-17 MED ORDER — ACETAMINOPHEN 325 MG PO TABS: 650.0000 mg | ORAL_TABLET | ORAL | Status: DC | PRN

## 2013-09-17 MED ORDER — ASPIRIN 81 MG PO CHEW
81.0000 mg | CHEWABLE_TABLET | ORAL | Status: AC
  Administered 2013-09-17: 81 mg via ORAL

## 2013-09-17 MED ORDER — HEPARIN SODIUM (PORCINE) 1000 UNIT/ML IJ SOLN
INTRAMUSCULAR | Status: AC
  Filled 2013-09-17: qty 1

## 2013-09-17 MED ORDER — SODIUM CHLORIDE 0.9 % IV SOLN
1.0000 mL/kg/h | INTRAVENOUS | Status: DC
  Administered 2013-09-17: 1 mL/kg/h via INTRAVENOUS

## 2013-09-17 MED ORDER — SODIUM CHLORIDE 0.9 % IJ SOLN
3.0000 mL | INTRAMUSCULAR | Status: DC | PRN
  Administered 2013-09-17: 3 mL via INTRAVENOUS

## 2013-09-17 MED ORDER — SODIUM CHLORIDE 0.9 % IV SOLN: 250.0000 mL | INTRAVENOUS | Status: DC | PRN

## 2013-09-17 MED ORDER — SODIUM CHLORIDE 0.9 % IV SOLN: 1.0000 mL/kg/h | INTRAVENOUS | Status: DC

## 2013-09-17 MED ORDER — HEPARIN (PORCINE) IN NACL 2-0.9 UNIT/ML-% IJ SOLN
INTRAMUSCULAR | Status: AC
  Filled 2013-09-17: qty 1000

## 2013-09-17 NOTE — CV Procedure (Signed)
    Cardiac Catheterization Procedure Note  Name: Linda Parker MRN: [id number] DOB: [birth year]  Procedure: Right Heart Cath, Catheter placement for coronary angiography, Selective Coronary Angiography, aortic root angiography  Indication: Severe symptomatic aortic stenosis   Procedural Details: The right wrist was prepped, draped, and anesthetized with 1% lidocaine. Using the modified Seldinger technique a 5/6 French sheath was placed in the right radial artery and a 5 French sheath was placed in the right antecubital vein by changing out a peripheral IV over a 0.018 wire. A Swan-Ganz catheter was used for the right heart catheterization. Standard protocol was followed for recording of right heart pressures and sampling of oxygen saturations. Fick cardiac output was calculated. Standard Judkins catheters were used for selective coronary angiography and aortic root angiography. An anomalous circumflex was identified and selectively injected with an AR-1 catheter. There were no immediate procedural complications. The patient was transferred to the post catheterization recovery area for further monitoring.  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  Left anterior descending (LAD): There is mild calcification of the proximal LAD without significant stenosis. The first diagonal branch exhibits eccentric heavy calcification with associated 90% stenosis. The LAD at the origin of the first diagonal has an eccentric 50% stenosis. Just after the first septal perforator the LAD has 80-90% stenosis. The second diagonal branch has 75% ostial stenosis. The mid LAD has mild nonobstructive disease. The LAD reaches the left ventricular  apex.  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 There is heavy associated calcification. There is 50-60% stenosis in the proximal circumflex approximately 1-2 cm beyond its origin. There is another 50% stenosis at the origin of the obtuse marginal branches. There are 2 major OM branches without significant disease noted.  Right coronary artery (RCA): This is a dominant vessel. There is moderate diffuse calcification. The proximal vessel has 30-40% stenosis. The mid vessel has 30-40% stenosis. The distal vessel is patent. The PDA branch is patent without significant stenosis.  Left ventriculography: Deferred  Aortic root angiography: The aortic valve is severely calcified and restricted. There is mild aortic insufficiency. There does not appear to be any significant dilatation of the ascending aorta. There is heavy calcification of all 3 coronary arteries.  Final Conclusions:   1. Known severe aortic stenosis with mild aortic insufficiency visualized by aortic root angiography 2. Essentially normal intra-coronary hemodynamics based on right heart cath data 3. 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

## 2013-09-17 NOTE — H&P (View-Only) (Signed)
      HPI: FU AS. Previous event monitor showed sinus rhythm with rare PAC and PVC but she did not have palpitations while wearing the monitor. Nuclear study in May of 2013 showed an ejection fraction of 77% and normal perfusion. Last echo in May of 2015 and showed normal LV function, severe AS (mean gradient 49 mmHg) and mild AI. Since I last saw her, For the past one and one half months she has noticed some bilateral pedal edema worse during the day improved overnight. She had an episode of chest tightness while running to the bathroom one afternoon. Relieved with rest. No syncope.   Current Outpatient Prescriptions  Medication Sig Dispense Refill  . estradiol (VIVELLE-DOT) 0.025 MG/24HR Place 1 patch onto the skin 2 (two) times a week.      . levothyroxine (SYNTHROID, LEVOTHROID) 50 MCG tablet Take 50 mcg by mouth daily before breakfast.       No current facility-administered medications for this visit.     Past Medical History  Diagnosis Date  . Unspecified hypothyroidism   . Aortic stenosis   . Palpitations   . Dysrhythmia     when taking antihistamines  . Arthritis     Past Surgical History  Procedure Laterality Date  . Finger surgery      left hand  . Bilateral knee surgery  35 yrs. ago    cartilage removed from knees  . Abdominal hysterectomy    . Tonsillectomy    . Appendectomy      at time of hysterectomy  . Total knee arthroplasty Right 03/04/2013    Procedure: RIGHT TOTAL KNEE ARTHROPLASTY;  Surgeon: Frank V Aluisio, MD;  Location: [facility];  Service: Orthopedics;  Laterality: Right;  . Injection knee Left 03/04/2013    Procedure: left KNEE cortisone INJECTION;  Surgeon: Frank V Aluisio, MD;  Location: [facility];  Service: Orthopedics;  Laterality: Left;    History   Social History  . Marital Status: Married    Spouse Name: N/A    Number of Children: N/A  . Years of Education: N/A   Occupational History  . Not on file.   Social History Main Topics  .  Smoking status: Never Smoker   . Smokeless tobacco: Not on file  . Alcohol Use: 0.6 oz/week    1 Glasses of wine per week     Comment: 4 oz nightly red wine  . Drug Use: No  . Sexual Activity: Not on file   Other Topics Concern  . Not on file   Social History Narrative  . No narrative on file    ROS: no fevers or chills, productive cough, hemoptysis, dysphasia, odynophagia, melena, hematochezia, dysuria, hematuria, rash, seizure activity, orthopnea, PND, pedal edema, claudication. Remaining systems are negative.  Physical Exam: Well-developed well-nourished in no acute distress.  Skin is warm and dry.  HEENT is normal.  Neck is supple.  Chest is clear to auscultation with normal expansion.  Cardiovascular exam is regular rate and rhythm. 2/6 systolic murmur left sternal border. Abdominal exam nontender or distended. No masses palpated. Extremities show no edema. neuro grossly intact  ECG Sinus rhythm at a rate of 76. Left axis deviation.     

## 2013-09-17 NOTE — Interval H&P Note (Signed)
History and Physical Interval Note:  09/17/2013 7:36 AM  Linda Parker  has presented today for surgery, with the diagnosis of AF  The various methods of treatment have been discussed with the patient and family. After consideration of risks, benefits and other options for treatment, the patient has consented to  Procedure(s): LEFT AND RIGHT HEART CATHETERIZATION WITH CORONARY ANGIOGRAM (N/A) as a surgical intervention .  The patient's history has been reviewed, patient examined, no change in status, stable for surgery.  I have reviewed the patient's chart and labs.  Questions were answered to the patient's satisfaction.    Cath Lab Visit (complete for each Cath Lab visit)  Clinical Evaluation Leading to the Procedure:   ACS: no  Non-ACS:    Anginal Classification: CCS II  Anti-ischemic medical therapy: No Therapy  Non-Invasive Test Results: No non-invasive testing performed  Prior CABG: No previous CABG       Sherren Mocha

## 2013-09-17 NOTE — Discharge Instructions (Signed)

## 2013-09-18 ENCOUNTER — Institutional Professional Consult (permissible substitution) (INDEPENDENT_AMBULATORY_CARE_PROVIDER_SITE_OTHER): Payer: Medicare Other | Admitting: Surgery

## 2013-09-18 ENCOUNTER — Encounter: Payer: Self-pay | Admitting: Surgery

## 2013-09-18 ENCOUNTER — Other Ambulatory Visit: Payer: Self-pay

## 2013-09-18 VITALS — BP 130/75 | HR 80 | Resp 16 | Ht 64.0 in | Wt 126.0 lb

## 2013-09-18 DIAGNOSIS — I2510 Atherosclerotic heart disease of native coronary artery without angina pectoris: Secondary | ICD-10-CM

## 2013-09-18 DIAGNOSIS — I350 Nonrheumatic aortic (valve) stenosis: Secondary | ICD-10-CM

## 2013-09-18 DIAGNOSIS — I359 Nonrheumatic aortic valve disorder, unspecified: Secondary | ICD-10-CM

## 2013-09-20 ENCOUNTER — Ambulatory Visit (HOSPITAL_COMMUNITY)
Admission: RE | Admit: 2013-09-20 | Discharge: 2013-09-20 | Disposition: A | Discharge status: Home / Self Care | Payer: Medicare Other | Source: Ambulatory Visit | Attending: Surgery | Admitting: Surgery

## 2013-09-20 ENCOUNTER — Encounter (HOSPITAL_COMMUNITY): Payer: Self-pay

## 2013-09-20 ENCOUNTER — Other Ambulatory Visit: Payer: Medicare Other | Admitting: *Deleted

## 2013-09-20 ENCOUNTER — Encounter: Payer: Self-pay | Admitting: Surgery

## 2013-09-20 ENCOUNTER — Encounter (HOSPITAL_COMMUNITY)
Admission: RE | Admit: 2013-09-20 | Discharge: 2013-09-20 | Disposition: A | Discharge status: Home / Self Care | Payer: Medicare Other | Source: Ambulatory Visit | Attending: Surgery | Admitting: Surgery

## 2013-09-20 VITALS — BP 127/73 | HR 74 | Temp 97.6°F | Resp 16 | Ht 63.0 in | Wt 124.3 lb

## 2013-09-20 DIAGNOSIS — F419 Anxiety disorder, unspecified: Secondary | ICD-10-CM

## 2013-09-20 DIAGNOSIS — I350 Nonrheumatic aortic (valve) stenosis: Secondary | ICD-10-CM

## 2013-09-20 DIAGNOSIS — I2510 Atherosclerotic heart disease of native coronary artery without angina pectoris: Secondary | ICD-10-CM

## 2013-09-20 DIAGNOSIS — Z01818 Encounter for other preprocedural examination: Secondary | ICD-10-CM | POA: Insufficient documentation

## 2013-09-20 DIAGNOSIS — Z01810 Encounter for preprocedural cardiovascular examination: Secondary | ICD-10-CM

## 2013-09-20 DIAGNOSIS — I359 Nonrheumatic aortic valve disorder, unspecified: Secondary | ICD-10-CM | POA: Insufficient documentation

## 2013-09-20 HISTORY — DX: Cardiac murmur, unspecified: R01.1

## 2013-09-20 LAB — COMPREHENSIVE METABOLIC PANEL
ALT: 15 U/L (ref 0–35)
AST: 22 U/L (ref 0–37)
Albumin: 4 g/dL (ref 3.5–5.2)
Alkaline Phosphatase: 78 U/L (ref 39–117)
BUN: 18 mg/dL (ref 6–23)
CALCIUM: 9.5 mg/dL (ref 8.4–10.5)
CO2: 18 mEq/L — ABNORMAL LOW (ref 19–32)
Chloride: 107 mEq/L (ref 96–112)
Creatinine, Ser: 0.6 mg/dL (ref 0.50–1.10)
GFR, EST NON AFRICAN AMERICAN: 87 mL/min — AB (ref >90)
GLUCOSE: 80 mg/dL (ref 70–99)
Potassium: 4.2 mEq/L (ref 3.7–5.3)
Sodium: 141 mEq/L (ref 137–147)
Total Bilirubin: 0.3 mg/dL (ref 0.3–1.2)
Total Protein: 7.9 g/dL (ref 6.0–8.3)

## 2013-09-20 LAB — PROTIME-INR
INR: 0.95 (ref 0.00–1.49)
PROTHROMBIN TIME: 12.5 s (ref 11.6–15.2)

## 2013-09-20 LAB — CBC
HCT: 38.6 % (ref 36.0–46.0)
Hemoglobin: 13.2 g/dL (ref 12.0–15.0)
MCH: 33.4 pg (ref 26.0–34.0)
MCHC: 34.2 g/dL (ref 30.0–36.0)
MCV: 97.7 fL (ref 78.0–100.0)
PLATELETS: 257 10*3/uL (ref 150–400)
RBC: 3.95 MIL/uL (ref 3.87–5.11)
RDW: 13.1 % (ref 11.5–15.5)
WBC: 8.4 10*3/uL (ref 4.0–10.5)

## 2013-09-20 LAB — URINALYSIS, ROUTINE W REFLEX MICROSCOPIC
Bilirubin Urine: NEGATIVE
Glucose, UA: NEGATIVE mg/dL
Hgb urine dipstick: NEGATIVE
KETONES UR: NEGATIVE mg/dL
LEUKOCYTES UA: NEGATIVE
NITRITE: NEGATIVE
Protein, ur: NEGATIVE mg/dL
SPECIFIC GRAVITY, URINE: 1.026 (ref 1.005–1.030)
UROBILINOGEN UA: 0.2 mg/dL (ref 0.0–1.0)
pH: 5.5 (ref 5.0–8.0)

## 2013-09-20 LAB — BLOOD GAS, ARTERIAL
Acid-base deficit: 1.9 mmol/L (ref 0.0–2.0)
Bicarbonate: 21.7 mEq/L (ref 20.0–24.0)
DRAWN BY: 344381
FIO2: 0.21 %
O2 Saturation: 98.2 %
PO2 ART: 107 mmHg — AB (ref 80.0–100.0)
Patient temperature: 98.6
TCO2: 22.7 mmol/L (ref 0–100)
pCO2 arterial: 32.3 mmHg — ABNORMAL LOW (ref 35.0–45.0)
pH, Arterial: 7.441 (ref 7.350–7.450)

## 2013-09-20 LAB — SURGICAL PCR SCREEN
MRSA, PCR: NEGATIVE
Staphylococcus aureus: NEGATIVE

## 2013-09-20 LAB — HEMOGLOBIN A1C
HEMOGLOBIN A1C: 5.4 % (ref <5.7)
MEAN PLASMA GLUCOSE: 108 mg/dL (ref <117)

## 2013-09-20 LAB — APTT: APTT: 26 s (ref 24–37)

## 2013-09-20 IMAGING — CR DG CHEST 2V
2 series · 2 of 2 positions shown · non-contrast
Comparison: [DATE]

CLINICAL DATA: Preop for aortic valve replacement

EXAM:
CHEST  2 VIEW

[w chest pa]
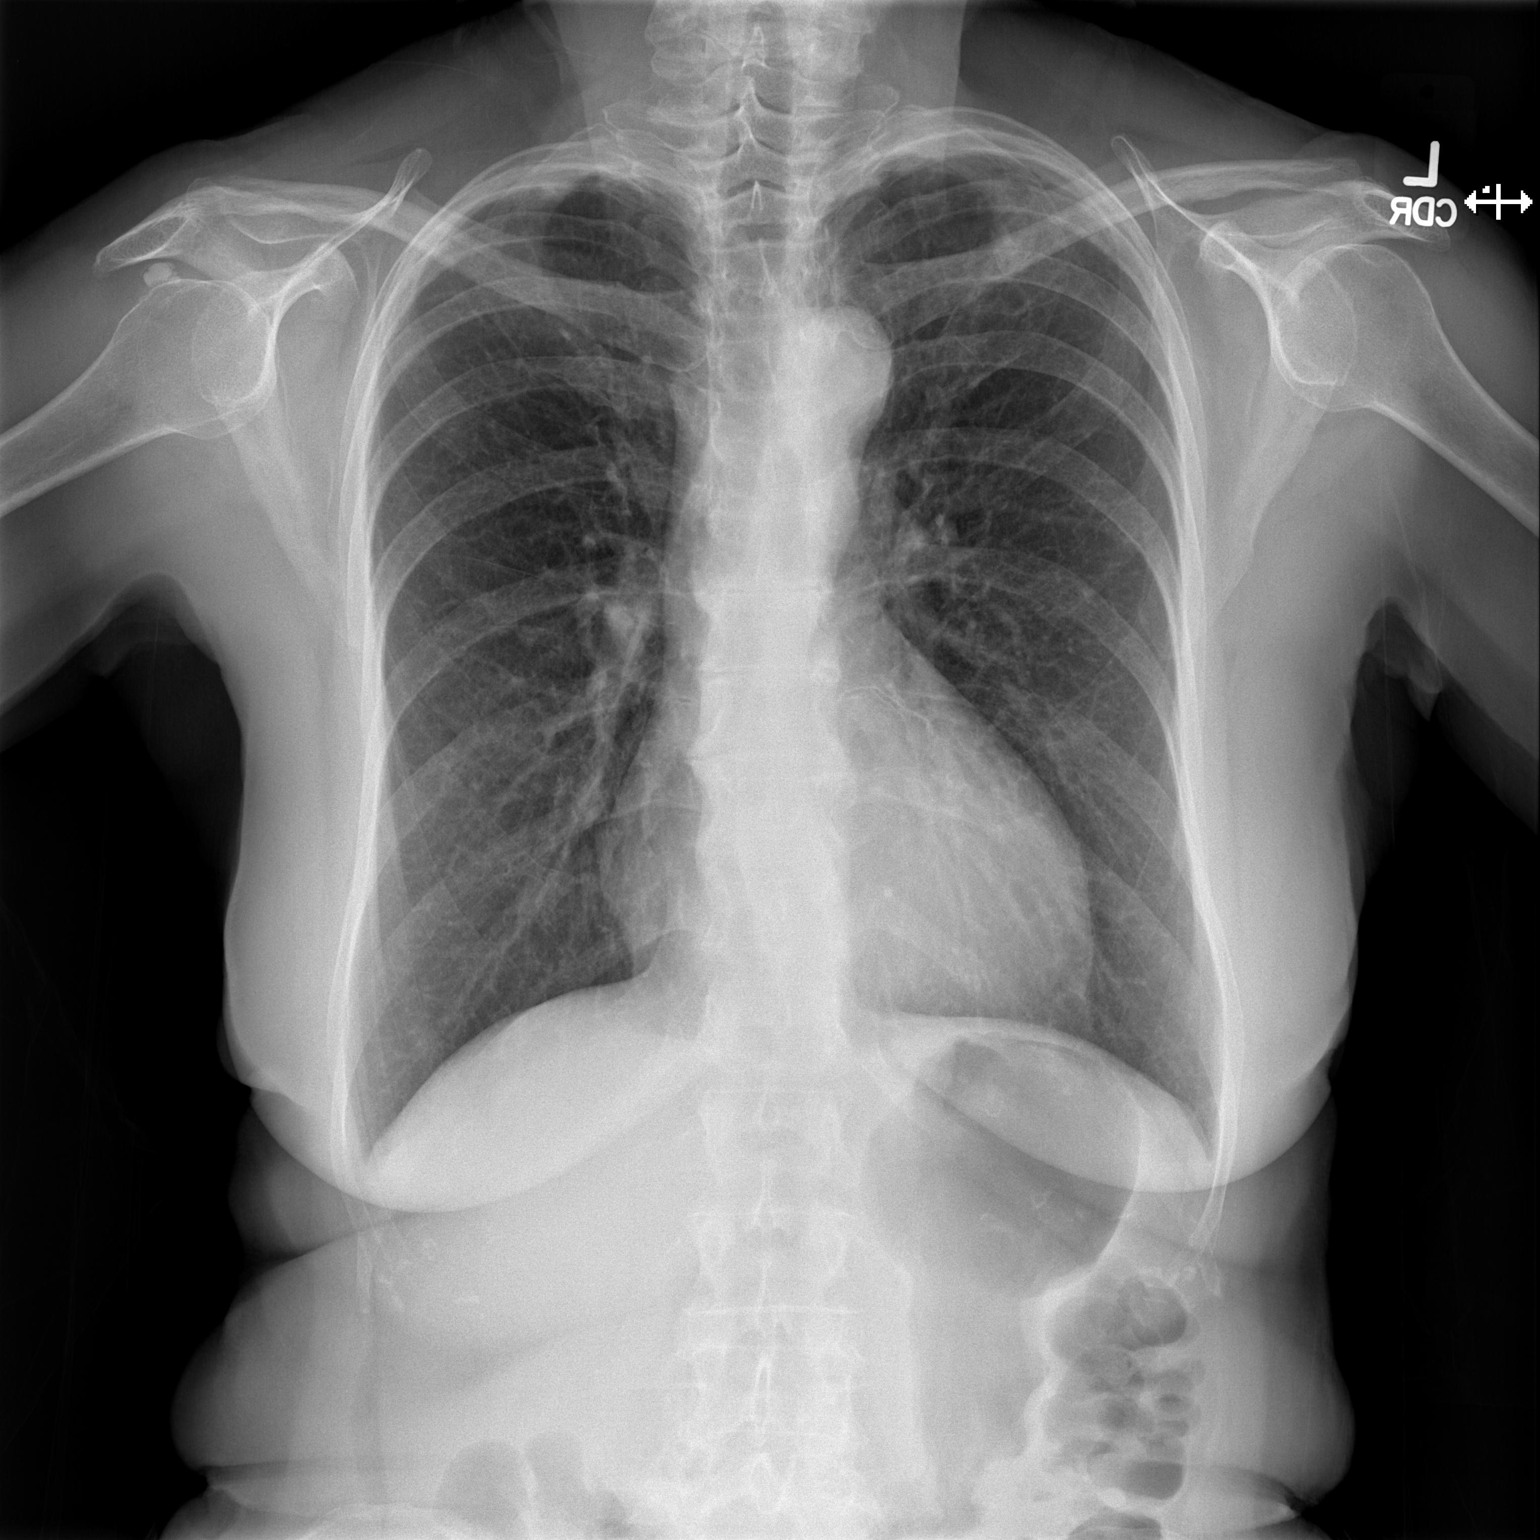

[w chest lat]
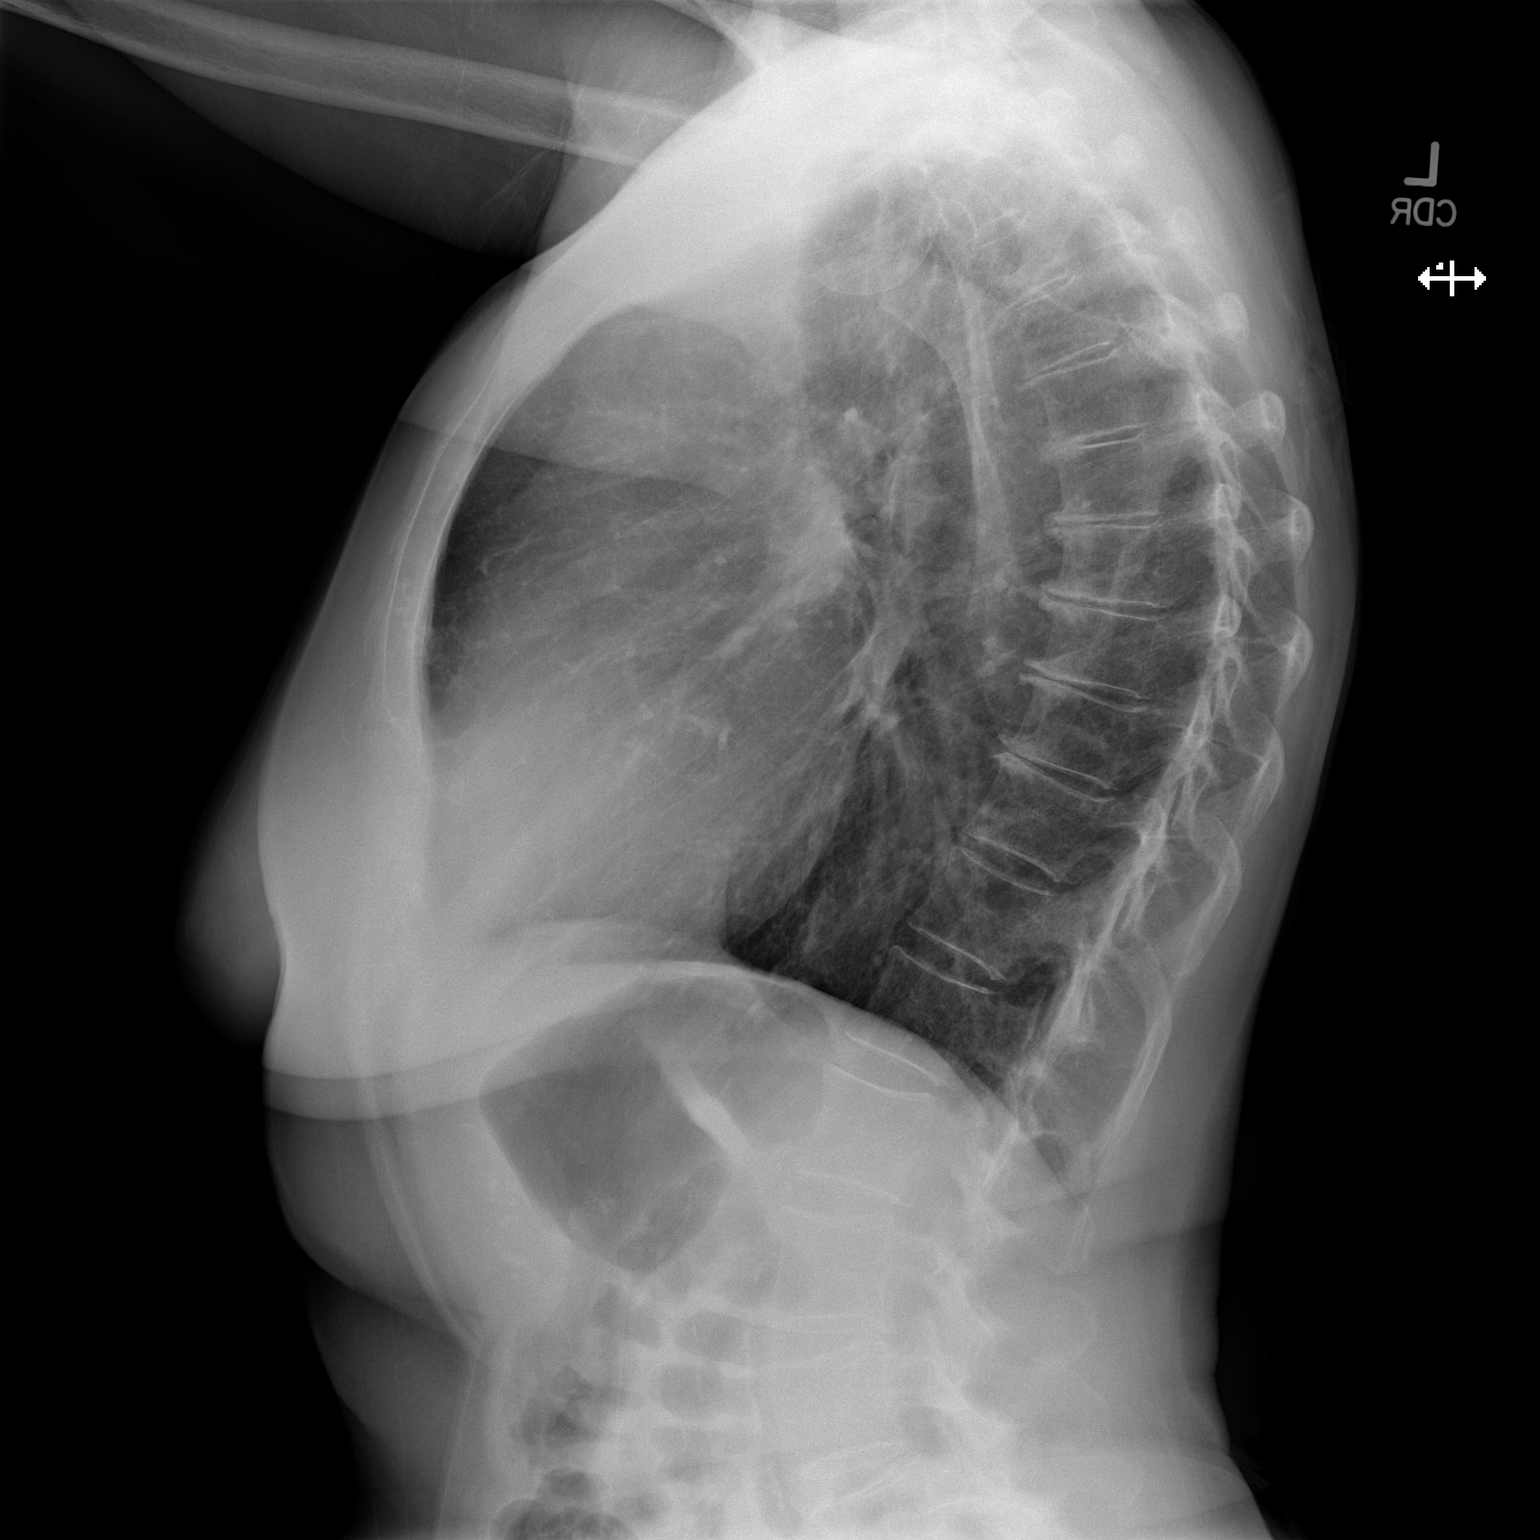

[2 of 2 positions shown; findings below may reference images not displayed]

FINDINGS: Cardiomediastinal silhouette is stable. No acute infiltrate or
pleural effusion. No pulmonary edema. Mild degenerative changes
thoracic spine again noted.
IMPRESSION: No active cardiopulmonary disease.

## 2013-09-20 MED ORDER — ALPRAZOLAM 0.25 MG PO TABS: 0.2500 mg | ORAL_TABLET | Freq: Every evening | ORAL | Status: DC | PRN

## 2013-09-20 NOTE — Progress Notes (Signed)
PCP is Haywood Pao, MD Referring Provider is Lelon Perla, MD  Chief Complaint  Patient presents with  . Aortic Stenosis    eval for surgery...referred by Dr. [name]...ECHO 09/02/13, CATHED 09/17/13  . Coronary Artery Disease    HPI:  The patient is a 78 year old woman with a history of aortic stenosis, hypothyroidism on replacement therapy, and palpitations. Her most recent echo on 09/02/2013 showed severe aortic stenosis with an AVA of 0.7 cm2 with a mean gradient of 49 mm Hg. LVEF was 60-65%. The mean gradient 1 year ago was 37. For the past couple months she has noted some pedal edema bilaterally when she is up during the day. She also had an episode of chest tightness while running to the bathroom in her house. She tells me that she is having some chest discomfort in the office today talking about surgery.  Past Medical History  Diagnosis Date  . Unspecified hypothyroidism   . Aortic stenosis   . Palpitations   . Dysrhythmia     when taking antihistamines  . Arthritis     Past Surgical History  Procedure Laterality Date  . Finger surgery      left hand  . Bilateral knee surgery  35 yrs. ago    cartilage removed from knees  . Abdominal hysterectomy    . Tonsillectomy    . Appendectomy      at time of hysterectomy  . Total knee arthroplasty Right 03/04/2013    Procedure: RIGHT TOTAL KNEE ARTHROPLASTY;  Surgeon: Gearlean Alf, MD;  Location: [facility];  Service: Orthopedics;  Laterality: Right;  . Injection knee Left 03/04/2013    Procedure: left KNEE cortisone INJECTION;  Surgeon: Gearlean Alf, MD;  Location: [facility];  Service: Orthopedics;  Laterality: Left;    History reviewed. No pertinent family history.  Social History History  Substance Use Topics  . Smoking status: Never Smoker   . Smokeless tobacco: Not on file  . Alcohol Use: 0.6 oz/week    1 Glasses of wine per week     Comment: 4 oz nightly red wine    Current Outpatient  Prescriptions  Medication Sig Dispense Refill  . aspirin EC 81 MG tablet Take 81 mg by mouth daily.      . Calcium-Magnesium-Vitamin D (CALCIUM MAGNESIUM PO) Take 1 tablet by mouth 2 (two) times daily.      Marland Kitchen estradiol (ESTRACE) 0.5 MG tablet Take 0.5 mg by mouth daily.      . folic acid (FOLVITE) 811 MCG tablet Take 400 mcg by mouth daily.      . Glucosamine-Chondroitin (GLUCOSAMINE CHONDR COMPLEX PO) Take 1 tablet by mouth 2 (two) times daily.      Marland Kitchen levothyroxine (SYNTHROID, LEVOTHROID) 50 MCG tablet Take 50 mcg by mouth daily before breakfast.      . Methylsulfonylmethane (MSM) 1000 MG TABS Take 1,000 mg by mouth 2 (two) times daily.      . Multiple Vitamin (MULTIVITAMIN WITH MINERALS) TABS tablet Take 1 tablet by mouth daily.      . ondansetron (ZOFRAN) 4 MG tablet Take 4 mg by mouth every 6 (six) hours as needed for nausea or vomiting.      . Turmeric 500 MG CAPS Take 1 tablet by mouth daily.      . vitamin E 400 UNIT capsule Take 400 Units by mouth daily.       No current facility-administered medications for this visit.  Allergies  Allergen Reactions  . Erythromycin Nausea Only    Feels like she is dying  . Sulfonamide Derivatives Nausea Only    Feels like she is dying    Review of Systems  Constitutional: Positive for activity change and fatigue. Negative for fever, chills, appetite change and unexpected weight change.  HENT: Negative.   Eyes: Negative.   Respiratory: Negative.  Negative for shortness of breath.   Cardiovascular: Positive for chest pain.  Gastrointestinal: Negative.   Endocrine: Negative.   Genitourinary: Negative.   Musculoskeletal: Positive for arthralgias.  Skin: Negative.   Allergic/Immunologic: Negative.   Neurological: Negative for dizziness and syncope.  Hematological: Negative.   Psychiatric/Behavioral: Negative.     BP 130/75  Pulse 80  Resp 16  Ht 5\' 4"  (1.626 m)  Wt 126 lb (57.153 kg)  BMI 21.62 kg/m2  SpO2 98% Physical Exam    Constitutional: She is oriented to person, place, and time. She appears well-developed and well-nourished. No distress.  HENT:  Head: Normocephalic and atraumatic.  Mouth/Throat: Oropharynx is clear and moist.  Eyes: EOM are normal. Pupils are equal, round, and reactive to light.  Neck: Normal range of motion. Neck supple. No JVD present. No thyromegaly present.  Cardiovascular: Normal rate and regular rhythm.   Murmur heard. 2/6 systolic murmur over aorta  Pulmonary/Chest: Effort normal and breath sounds normal. No respiratory distress. She has no rales.  Abdominal: Soft. Bowel sounds are normal. She exhibits no distension and no mass. There is no tenderness.  Musculoskeletal: Normal range of motion. She exhibits no edema and no tenderness.  Right knee incision from TKR  Neurological: She is alert and oriented to person, place, and time. She has normal strength. No cranial nerve deficit or sensory deficit.  Skin: Skin is warm and dry.  Psychiatric: She has a normal mood and affect.     Diagnostic Tests:  [facility] [address], [place] [address] [phone]  ------------------------------------------------------------ Transthoracic Echocardiography  Patient: Linda, Parker MR #: [id number] Study Date: 09/02/2013 Gender: F Age: 82 Height: 160cm Weight: 59.4kg BSA: 1.55m^2 Pt. Status: Room:  ATTENDING Coy Saunas, Para March SONOGRAPHER McFatter, [facility] PERFORMING [facility], Outpatient cc:  ------------------------------------------------------------ LV EF: 60% - 65%  ------------------------------------------------------------ Indications: Aortic stenosis 424.1.  ------------------------------------------------------------ History: PMH: Acquired from the patient and from the patient's chart. Palpitations and bilateral lower extremity edema. Aortic valve  disease.  ------------------------------------------------------------ Study Conclusions  - Left ventricle: The cavity size was normal. Wall thickness was increased in a pattern of mild LVH. There was mild focal basal hypertrophy of the septum. Systolic function was normal. The estimated ejection fraction was in the range of 60% to 65%. Wall motion was normal; there were no regional wall motion abnormalities. Doppler parameters are consistent with abnormal left ventricular relaxation (grade 1 diastolic dysfunction). Doppler parameters are consistent with high ventricular filling pressure. - Aortic valve: Valve mobility was restricted. There was severe stenosis. Mild regurgitation. Valve area: 0.77cm^2(VTI). Valve area: 0.7cm^2 (Vmax). - Mitral valve: Calcified annulus. Impressions:  - Compared to 09/05/12, aortic stenosis has progressed and is now severe.  ------------------------------------------------------------ Labs, prior tests, procedures, and surgery: Echocardiography. The aortic valve showed moderate to severe stenosis and mild regurgitation. EF was 60%. Aortic valve: peak gradient of 58mm Hg.  Transthoracic echocardiography. M-mode, complete 2D, spectral Doppler, and color Doppler. Height: Height: 160cm. Height: 63in. Weight: Weight: 59.4kg. Weight: 130.7lb. Body mass index: BMI: 23.2kg/m^2. Body surface area: BSA: 1.18m^2. Blood  pressure: 128/76. Patient status: Outpatient. Location: [facility]  ------------------------------------------------------------  ------------------------------------------------------------ Left ventricle: The cavity size was normal. Wall thickness was increased in a pattern of mild LVH. There was mild focal basal hypertrophy of the septum. Systolic function was normal. The estimated ejection fraction was in the range of 60% to 65%. Wall motion was normal; there were no regional wall motion abnormalities. Doppler parameters are  consistent with abnormal left ventricular relaxation (grade 1 diastolic dysfunction). Doppler parameters are consistent with high ventricular filling pressure.  ------------------------------------------------------------ Aortic valve: Trileaflet; severely calcified leaflets. Valve mobility was restricted. Doppler: There was severe stenosis. Mild regurgitation. VTI ratio of LVOT to aortic valve: 0.2. Valve area: 0.77cm^2(VTI). Indexed valve area: 0.48cm^2/m^2 (VTI). Peak velocity ratio of LVOT to aortic valve: 0.18. Valve area: 0.7cm^2 (Vmax). Indexed valve area: 0.43cm^2/m^2 (Vmax). Mean gradient: 34mm Hg (S). Peak gradient: 30mm Hg (S).  ------------------------------------------------------------ Aorta: Aortic root: The aortic root was normal in size.  ------------------------------------------------------------ Mitral valve: Calcified annulus. Mobility was not restricted. Doppler: Transvalvular velocity was within the normal range. There was no evidence for stenosis. Trivial regurgitation. Peak gradient: 52mm Hg (D).  ------------------------------------------------------------ Left atrium: The atrium was normal in size.  ------------------------------------------------------------ Right ventricle: The cavity size was normal. Systolic function was normal.  ------------------------------------------------------------ Pulmonic valve: Doppler: Transvalvular velocity was within the normal range. There was no evidence for stenosis. Trivial regurgitation.  ------------------------------------------------------------ Tricuspid valve: Structurally normal valve. Doppler: Transvalvular velocity was within the normal range. Mild regurgitation.  ------------------------------------------------------------ Pulmonary artery: Systolic pressure was within the normal range.  ------------------------------------------------------------ Right atrium: The atrium was normal in  size.  ------------------------------------------------------------ Pericardium: There was no pericardial effusion.  ------------------------------------------------------------ Systemic veins: Inferior vena cava: The vessel was normal in size.  ------------------------------------------------------------  2D measurements Normal Doppler measurements Normal Left ventricle Main pulmonary LVID ED, 41 mm 43-52 artery chord, Pressure, 21 mm Hg =30 PLAX S LVID ES, 22 mm 23-38 Left ventricle chord, Ea, lat 4.79 cm/s ------ PLAX ann, tiss FS, 46 % >29 DP chord, E/Ea, lat 17.1 ------ PLAX ann, tiss 2 LVPW, ED 13.98 mm ------ DP IVS/LVPW 1 <1.3 Ea, med 4.9 cm/s ------ ratio, ED ann, tiss Ventricular septum DP IVS, ED 14 mm ------ E/Ea, med 16.7 ------ LVOT ann, tiss 3 Diam, S 22 mm ------ DP Area 3.8 cm^2 ------ LVOT Diam 22 mm ------ Peak vel, 83 cm/s ------ Aorta S Root 33 mm ------ VTI, S 24 cm ------ diam, ED Stroke vol 91.2 ml ------ Left atrium Stroke 56.3 ml/m^2 ------ AP dim 35 mm ------ index AP dim 2.16 cm/m^2 <2.2 Aortic valve index Peak vel, 452 cm/s ------ S Mean vel, 330 cm/s ------ S VTI, S 118 cm ------ Mean 49 mm Hg ------ gradient, S Peak 82 mm Hg ------ gradient, S VTI ratio 0.2 ------ LVOT/AV Area, VTI 0.77 cm^2 ------ Area index 0.48 cm^2/m ------ (VTI) ^2 Peak vel 0.18 ------ ratio, LVOT/AV Area, Vmax 0.7 cm^2 ------ Area index 0.43 cm^2/m ------ (Vmax) ^2 Regurg PHT 528 ms ------ Mitral valve Peak E vel 82 cm/s ------ Peak A vel 97.3 cm/s ------ Decelerati 310 ms 150-23 on time 0 Peak 3 mm Hg ------ gradient, D Peak E/A 0.8 ------ ratio Max regurg 248 cm/s ------ vel Tricuspid valve Regurg 210 cm/s ------ peak vel Peak RV-RA 18 mm Hg ------ gradient, S Systemic veins Estimated 3 mm Hg ------ CVP Right ventricle Pressure, 21 mm Hg <30 S Sa vel, 12.1 cm/s ------ lat ann, tiss  DP  ------------------------------------------------------------  Prepared and Electronically Authenticated by  [name] 2015-05-11T09:51:17.133   Cardiac Catheterization Procedure Note  Name: Linda Parker  MRN: [id number]  DOB: Dec 18, 1935  Procedure: Right Heart Cath, Catheter placement for coronary angiography, Selective Coronary Angiography, aortic root angiography  Indication: Severe symptomatic aortic stenosis  Procedural Details: The right wrist was prepped, draped, and anesthetized with 1% lidocaine. Using the modified Seldinger technique a 5/6 French sheath was placed in the right radial artery and a 5 French sheath was placed in the right antecubital vein by changing out a peripheral IV over a 0.018 wire. A Swan-Ganz catheter was used for the right heart catheterization. Standard protocol was followed for recording of right heart pressures and sampling of oxygen saturations. Fick cardiac output was calculated. Standard Judkins catheters were used for selective coronary angiography and aortic root angiography. An anomalous circumflex was identified and selectively injected with an AR-1 catheter. There were no immediate procedural complications. The patient was transferred to the post catheterization recovery area for further monitoring.  Procedural Findings:  Hemodynamics  RA 2  RV 36/8  PA 31/12 mean 21  PCWP 8  LV not recorded  AO 117/64 mean 87  Oxygen saturations:  PA 77  AO 94  Cardiac Output (Fick) 6.9  Cardiac Index (Fick) 4.3  Coronary angiography:  Coronary dominance: right  Left mainstem: The LAD and left circumflex have separate ostia.  Left anterior descending (LAD): There is mild calcification of the proximal LAD without significant stenosis. The first diagonal branch exhibits eccentric heavy calcification with associated 90% stenosis. The LAD at the origin of the first diagonal has an eccentric 50% stenosis. Just after the first septal perforator the LAD has  80-90% stenosis. The second diagonal branch has 75% ostial stenosis. The mid LAD has mild nonobstructive disease. The LAD reaches the left ventricular apex.  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 There is heavy associated calcification. There is 50-60% stenosis in the proximal circumflex approximately 1-2 cm beyond its origin. There is another 50% stenosis at the origin of the obtuse marginal branches. There are 2 major OM branches without significant disease noted.  Right coronary artery (RCA): This is a dominant vessel. There is moderate diffuse calcification. The proximal vessel has 30-40% stenosis. The mid vessel has 30-40% stenosis. The distal vessel is patent. The PDA branch is patent without significant stenosis.  Left ventriculography: Deferred  Aortic root angiography: The aortic valve is severely calcified and restricted. There is mild aortic insufficiency. There does not appear to be any significant dilatation of the ascending aorta. There is heavy calcification of all 3 coronary arteries.  Final Conclusions:  1. Known severe aortic stenosis with mild aortic insufficiency visualized by aortic root angiography  2. Essentially normal intra-coronary hemodynamics based on right heart cath data  3. 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 The RCA has mild, non-obstructive disease. I agree that CABG and AVR is the best treatment for her to prevent further ischemia and infarction and  progression of congestive heart failure symptoms. I discussed the pros and cons  of mechanical and tissue valves and my recommendation for a tissue valve given her age of 89. She is in agreement. I discussed the operative procedure with the patient and family including alternatives, benefits and risks; including but not limited to bleeding, blood transfusion, infection, stroke, myocardial infarction, graft failure, heart block requiring a permanent pacemaker, organ dysfunction, and death.  Linda Parker understands and agrees to proceed.   Plan:  AVR and CABG on 09/24/2013.

## 2013-09-20 NOTE — Pre-Procedure Instructions (Signed)
Linda Parker  09/20/2013   Your procedure is scheduled on:  Tuesday, September 24, 2013  Report to [facility] ,  [place] [facility]  at 5:30 AM.  Call this number if you have problems the morning of surgery: [phone]   Remember:   Do not eat food or drink liquids after midnight. On MONDAY   Take these medicines the morning of surgery with A SIP OF WATER:  SYNTHROID, Estradiol   Do not wear jewelry, make-up or nail polish.   Do not wear lotions, powders, or perfumes. You may wear deodorant.   Do not shave 48 hours prior to surgery.   Do not bring valuables to the hospital.  [facility] is not responsible    For any belongings or valuables.               Contacts, dentures or bridgework may not be worn into surgery.   BRING CASE FOR GLASSES   Leave suitcase in the car. After surgery it may be brought to your room.   For patients admitted to the hospital, discharge time is determined by your                treatment team.               Patients discharged the day of surgery will not be allowed to drive  home.  Name and phone number of your driver: with family  Special Instructions: Special Instructions: [place] - Preparing for Surgery  Before surgery, you can play an important role.  Because skin is not sterile, your skin needs to be as free of germs as possible.  You can reduce the number of germs on you skin by washing with CHG (chlorahexidine gluconate) soap before surgery.  CHG is an antiseptic cleaner which kills germs and bonds with the skin to continue killing germs even after washing.  Please DO NOT use if you have an allergy to CHG or antibacterial soaps.  If your skin becomes reddened/irritated stop using the CHG and inform your nurse when you arrive at Short Stay.  Do not shave (including legs and underarms) for at least 48 hours prior to the first CHG shower.  You may shave your face.  Please follow these instructions carefully:   1.  Shower with CHG  Soap the night before surgery and the  morning of Surgery.  2.  If you choose to wash your hair, wash your hair first as usual with your  normal shampoo.  3.  After you shampoo, rinse your hair and body thoroughly to remove the  Shampoo.  4.  Use CHG as you would any other liquid soap.  You can apply chg directly to the skin and wash gently with scrungie or a clean washcloth.  5.  Apply the CHG Soap to your body ONLY FROM THE NECK DOWN.    Do not use on open wounds or open sores.  Avoid contact with your eyes, ears, mouth and genitals (private parts).  Wash genitals (private parts)   with your normal soap.  6.  Wash thoroughly, paying special attention to the area where your surgery will be performed.  7.  Thoroughly rinse your body with warm water from the neck down.  8.  DO NOT shower/wash with your normal soap after using and rinsing off   the CHG Soap.  9.  Pat yourself dry with a clean towel.  10.  Wear clean pajamas.            11.  Place clean sheets on your bed the night of your first shower and do not sleep with pets.  Day of Surgery  Do not apply any lotions/deodorants the morning of surgery.  Please wear clean clothes to the hospital/surgery center.   Please read over the following fact sheets that you were given: Pain Booklet, Coughing and Deep Breathing, Blood Transfusion Information, Open Heart Packet, MRSA Information and Surgical Site Infection Prevention

## 2013-09-20 NOTE — Progress Notes (Signed)
Pre-op Cardiac Surgery  Carotid Findings:  Bilateral:  1-39% ICA stenosis.  Vertebral artery flow is antegrade.      Upper Extremity Right Left  Brachial Pressures 119 124  Radial Waveforms Tri Tri  Ulnar Waveforms Tri Tri  Palmar Arch (Allen's Test) Normal  Normal    Findings:   Palpable pedal pulses.   Landry Mellow, RDMS, RVT 09/20/2013

## 2013-09-23 MED ORDER — CEFUROXIME SODIUM 750 MG IJ SOLR
750.0000 mg | INTRAMUSCULAR | Status: DC
  Filled 2013-09-23: qty 750

## 2013-09-23 MED ORDER — DOPAMINE-DEXTROSE 3.2-5 MG/ML-% IV SOLN
2.0000 ug/kg/min | INTRAVENOUS | Status: DC
  Filled 2013-09-23: qty 250

## 2013-09-23 MED ORDER — SODIUM CHLORIDE 0.9 % IV SOLN
INTRAVENOUS | Status: DC
  Filled 2013-09-23: qty 30

## 2013-09-23 MED ORDER — PLASMA-LYTE 148 IV SOLN
INTRAVENOUS | Status: AC
  Administered 2013-09-24: 09:00:00
  Filled 2013-09-23: qty 2.5

## 2013-09-23 MED ORDER — NITROGLYCERIN IN D5W 200-5 MCG/ML-% IV SOLN
2.0000 ug/min | INTRAVENOUS | Status: AC
  Administered 2013-09-24: 5 ug/min via INTRAVENOUS
  Filled 2013-09-23: qty 250

## 2013-09-23 MED ORDER — CEFUROXIME SODIUM 1.5 G IJ SOLR
1.5000 g | INTRAMUSCULAR | Status: AC
  Administered 2013-09-24: .75 g via INTRAVENOUS
  Administered 2013-09-24: 1.5 g via INTRAVENOUS
  Filled 2013-09-23: qty 1.5

## 2013-09-23 MED ORDER — MAGNESIUM SULFATE 50 % IJ SOLN
40.0000 meq | INTRAMUSCULAR | Status: DC
  Filled 2013-09-23: qty 10

## 2013-09-23 MED ORDER — METOPROLOL TARTRATE 12.5 MG HALF TABLET
12.5000 mg | ORAL_TABLET | Freq: Once | ORAL | Status: AC
  Administered 2013-09-24: 12.5 mg via ORAL
  Filled 2013-09-23: qty 1

## 2013-09-23 MED ORDER — SODIUM CHLORIDE 0.9 % IV SOLN
INTRAVENOUS | Status: AC
  Administered 2013-09-24: 1.7 [IU]/h via INTRAVENOUS
  Filled 2013-09-23: qty 1

## 2013-09-23 MED ORDER — EPINEPHRINE HCL 1 MG/ML IJ SOLN
0.5000 ug/min | INTRAVENOUS | Status: DC
  Filled 2013-09-23: qty 4

## 2013-09-23 MED ORDER — POTASSIUM CHLORIDE 2 MEQ/ML IV SOLN
80.0000 meq | INTRAVENOUS | Status: DC
  Filled 2013-09-23: qty 40

## 2013-09-23 MED ORDER — SODIUM CHLORIDE 0.9 % IV SOLN
INTRAVENOUS | Status: AC
  Administered 2013-09-24: 70 mL/h via INTRAVENOUS
  Filled 2013-09-23: qty 40

## 2013-09-23 MED ORDER — VANCOMYCIN HCL 10 G IV SOLR
1250.0000 mg | INTRAVENOUS | Status: AC
  Administered 2013-09-24: 1250 mg via INTRAVENOUS
  Filled 2013-09-23: qty 1250

## 2013-09-23 MED ORDER — PHENYLEPHRINE HCL 10 MG/ML IJ SOLN
30.0000 ug/min | INTRAVENOUS | Status: DC
  Filled 2013-09-23: qty 2

## 2013-09-23 MED ORDER — DEXMEDETOMIDINE HCL IN NACL 400 MCG/100ML IV SOLN
0.1000 ug/kg/h | INTRAVENOUS | Status: AC
  Administered 2013-09-24: 0.2 ug/kg/h via INTRAVENOUS
  Filled 2013-09-23: qty 100

## 2013-09-23 NOTE — H&P (Signed)
[address]       [place] [address]             [phone]      Cardiothoracic Surgery History and Physical   PCP is Haywood Pao, MD Referring Provider is Lelon Perla, MD    Chief Complaint   Patient presents with    .  Aortic Stenosis   .  Coronary Artery Disease     HPI:   The patient is a 78 year old woman with a history of aortic stenosis, hypothyroidism on replacement therapy, and palpitations. Her most recent echo on 09/02/2013 showed severe aortic stenosis with an AVA of 0.7 cm2 with a mean gradient of 49 mm Hg. LVEF was 60-65%. The mean gradient 1 year ago was 37. For the past couple months she has noted some pedal edema bilaterally when she is up during the day. She also had an episode of chest tightness while running to the bathroom in her house. She tells me that she is having some chest discomfort in the office today talking about surgery.    Past Medical History   Diagnosis  Date   .  Unspecified hypothyroidism     .  Aortic stenosis     .  Palpitations     .  Dysrhythmia         when taking antihistamines   .  Arthritis           Past Surgical History   Procedure  Laterality  Date   .  Finger surgery           left hand   .  Bilateral knee surgery    35 yrs. ago       cartilage removed from knees   .  Abdominal hysterectomy       .  Tonsillectomy       .  Appendectomy           at time of hysterectomy   .  Total knee arthroplasty  Right  03/04/2013       Procedure: RIGHT TOTAL KNEE ARTHROPLASTY;  Surgeon: Gearlean Alf, MD;  Location: [facility];  Service: Orthopedics;  Laterality: Right;   .  Injection knee  Left  03/04/2013       Procedure: left KNEE cortisone INJECTION;  Surgeon: Gearlean Alf, MD;  Location: [facility];  Service: Orthopedics;  Laterality: Left;        History reviewed. No pertinent family history.   Social History History   Substance Use Topics   .  Smoking status:  Never Smoker    .   Smokeless tobacco:  Not on file   .  Alcohol Use:  0.6 oz/week       1 Glasses of wine per week         Comment: 4 oz nightly red wine         Current Outpatient Prescriptions   Medication  Sig  Dispense  Refill   .  aspirin EC 81 MG tablet  Take 81 mg by mouth daily.         .  Calcium-Magnesium-Vitamin D (CALCIUM MAGNESIUM PO)  Take 1 tablet by mouth 2 (two) times daily.         Marland Kitchen  estradiol (ESTRACE) 0.5 MG tablet  Take 0.5 mg by mouth daily.         .  folic acid (FOLVITE) 268 MCG tablet  Take 400 mcg by mouth daily.         .  Glucosamine-Chondroitin (GLUCOSAMINE CHONDR COMPLEX PO)  Take 1 tablet by mouth 2 (two) times daily.         Marland Kitchen  levothyroxine (SYNTHROID, LEVOTHROID) 50 MCG tablet  Take 50 mcg by mouth daily before breakfast.         .  Methylsulfonylmethane (MSM) 1000 MG TABS  Take 1,000 mg by mouth 2 (two) times daily.         .  Multiple Vitamin (MULTIVITAMIN WITH MINERALS) TABS tablet  Take 1 tablet by mouth daily.         .  ondansetron (ZOFRAN) 4 MG tablet  Take 4 mg by mouth every 6 (six) hours as needed for nausea or vomiting.         .  Turmeric 500 MG CAPS  Take 1 tablet by mouth daily.         .  vitamin E 400 UNIT capsule  Take 400 Units by mouth daily.             No current facility-administered medications for this visit.         Allergies   Allergen  Reactions   .  Erythromycin  Nausea Only       Feels like she is dying   .  Sulfonamide Derivatives  Nausea Only       Feels like she is dying        Review of Systems  Constitutional: Positive for activity change and fatigue. Negative for fever, chills, appetite change and unexpected weight change.  HENT: Negative.   Eyes: Negative.   Respiratory: Negative.  Negative for shortness of breath.   Cardiovascular: Positive for chest pain.  Gastrointestinal: Negative.   Endocrine: Negative.   Genitourinary: Negative.   Musculoskeletal: Positive for arthralgias.  Skin: Negative.     Allergic/Immunologic: Negative.   Neurological: Negative for dizziness and syncope.  Hematological: Negative.   Psychiatric/Behavioral: Negative.       BP 130/75  Pulse 80  Resp 16  Ht 5\' 4"  (1.626 m)  Wt 126 lb (57.153 kg)  BMI 21.62 kg/m2  SpO2 98% Physical Exam  Constitutional: She is oriented to person, place, and time. She appears well-developed and well-nourished. No distress.  HENT:   Head: Normocephalic and atraumatic.   Mouth/Throat: Oropharynx is clear and moist.  Eyes: EOM are normal. Pupils are equal, round, and reactive to light.  Neck: Normal range of motion. Neck supple. No JVD present. No thyromegaly present.  Cardiovascular: Normal rate and regular rhythm.    Murmur heard. 2/6 systolic murmur over aorta  Pulmonary/Chest: Effort normal and breath sounds normal. No respiratory distress. She has no rales.  Abdominal: Soft. Bowel sounds are normal. She exhibits no distension and no mass. There is no tenderness.  Musculoskeletal: Normal range of motion. She exhibits no edema and no tenderness.  Right knee incision from TKR  Neurological: She is alert and oriented to person, place, and time. She has normal strength. No cranial nerve deficit or sensory deficit.  Skin: Skin is warm and dry.  Psychiatric: She has a normal mood and affect.        Diagnostic Tests:   [facility] [address], [place] [address] [phone]  ------------------------------------------------------------ Transthoracic Echocardiography  Patient: Saori, Youn MR #: [id number] Study Date: 09/02/2013 Gender: F Age: 36 Height: 160cm Weight: 59.4kg BSA: 1.61m^2 Pt. Status: Room:  ATTENDING  [name] [place], Brian REFERRING [name] SONOGRAPHER McFatter, Mandy PERFORMING [facility], Outpatient cc:  ------------------------------------------------------------ LV EF: 60% -  65%  ------------------------------------------------------------ Indications: Aortic stenosis 424.1.  ------------------------------------------------------------ History: PMH: Acquired from the patient and from the patient's chart. Palpitations and bilateral lower extremity edema. Aortic valve disease.  ------------------------------------------------------------ Study Conclusions  - Left ventricle: The cavity size was normal. Wall thickness was increased in a pattern of mild LVH. There was mild focal basal hypertrophy of the septum. Systolic function was normal. The estimated ejection fraction was in the range of 60% to 65%. Wall motion was normal; there were no regional wall motion abnormalities. Doppler parameters are consistent with abnormal left ventricular relaxation (grade 1 diastolic dysfunction). Doppler parameters are consistent with high ventricular filling pressure. - Aortic valve: Valve mobility was restricted. There was severe stenosis. Mild regurgitation. Valve area: 0.77cm^2(VTI). Valve area: 0.7cm^2 (Vmax). - Mitral valve: Calcified annulus. Impressions:  - Compared to 09/05/12, aortic stenosis has progressed and is now severe.  ------------------------------------------------------------ Labs, prior tests, procedures, and surgery: Echocardiography. The aortic valve showed moderate to severe stenosis and mild regurgitation. EF was 60%. Aortic valve: peak gradient of 14mm Hg.  Transthoracic echocardiography. M-mode, complete 2D, spectral Doppler, and color Doppler. Height: Height: 160cm. Height: 63in. Weight: Weight: 59.4kg. Weight: 130.7lb. Body mass index: BMI: 23.2kg/m^2. Body surface area: BSA: 1.77m^2. Blood pressure: 128/76. Patient status: Outpatient. Location: [facility]  ------------------------------------------------------------  ------------------------------------------------------------ Left ventricle: The cavity size was normal.  Wall thickness was increased in a pattern of mild LVH. There was mild focal basal hypertrophy of the septum. Systolic function was normal. The estimated ejection fraction was in the range of 60% to 65%. Wall motion was normal; there were no regional wall motion abnormalities. Doppler parameters are consistent with abnormal left ventricular relaxation (grade 1 diastolic dysfunction). Doppler parameters are consistent with high ventricular filling pressure.  ------------------------------------------------------------ Aortic valve: Trileaflet; severely calcified leaflets. Valve mobility was restricted. Doppler: There was severe stenosis. Mild regurgitation. VTI ratio of LVOT to aortic valve: 0.2. Valve area: 0.77cm^2(VTI). Indexed valve area: 0.48cm^2/m^2 (VTI). Peak velocity ratio of LVOT to aortic valve: 0.18. Valve area: 0.7cm^2 (Vmax). Indexed valve area: 0.43cm^2/m^2 (Vmax). Mean gradient: 52mm Hg (S). Peak gradient: 49mm Hg (S).  ------------------------------------------------------------ Aorta: Aortic root: The aortic root was normal in size.  ------------------------------------------------------------ Mitral valve: Calcified annulus. Mobility was not restricted. Doppler: Transvalvular velocity was within the normal range. There was no evidence for stenosis. Trivial regurgitation. Peak gradient: 18mm Hg (D).  ------------------------------------------------------------ Left atrium: The atrium was normal in size.  ------------------------------------------------------------ Right ventricle: The cavity size was normal. Systolic function was normal.  ------------------------------------------------------------ Pulmonic valve: Doppler: Transvalvular velocity was within the normal range. There was no evidence for stenosis. Trivial regurgitation.  ------------------------------------------------------------ Tricuspid valve: Structurally normal valve. Doppler: Transvalvular  velocity was within the normal range. Mild regurgitation.  ------------------------------------------------------------ Pulmonary artery: Systolic pressure was within the normal range.  ------------------------------------------------------------ Right atrium: The atrium was normal in size.  ------------------------------------------------------------ Pericardium: There was no pericardial effusion.  ------------------------------------------------------------ Systemic veins: Inferior vena cava: The vessel was normal in size.  ------------------------------------------------------------  2D measurements Normal Doppler measurements Normal Left ventricle Main pulmonary LVID ED, 41 mm 43-52 artery chord, Pressure, 21 mm Hg =30 PLAX S LVID ES, 22 mm 23-38 Left ventricle chord, Ea, lat 4.79 cm/s ------ PLAX ann, tiss FS, 46 % >29 DP chord, E/Ea, lat 17.1 ------ PLAX ann, tiss 2 LVPW, ED 13.98 mm ------ DP IVS/LVPW 1 <1.3 Ea, med  4.9 cm/s ------ ratio, ED ann, tiss Ventricular septum DP IVS, ED 14 mm ------ E/Ea, med 16.7 ------ LVOT ann, tiss 3 Diam, S 22 mm ------ DP Area 3.8 cm^2 ------ LVOT Diam 22 mm ------ Peak vel, 83 cm/s ------ Aorta S Root 33 mm ------ VTI, S 24 cm ------ diam, ED Stroke vol 91.2 ml ------ Left atrium Stroke 56.3 ml/m^2 ------ AP dim 35 mm ------ index AP dim 2.16 cm/m^2 <2.2 Aortic valve index Peak vel, 452 cm/s ------ S Mean vel, 330 cm/s ------ S VTI, S 118 cm ------ Mean 49 mm Hg ------ gradient, S Peak 82 mm Hg ------ gradient, S VTI ratio 0.2 ------ LVOT/AV Area, VTI 0.77 cm^2 ------ Area index 0.48 cm^2/m ------ (VTI) ^2 Peak vel 0.18 ------ ratio, LVOT/AV Area, Vmax 0.7 cm^2 ------ Area index 0.43 cm^2/m ------ (Vmax) ^2 Regurg PHT 528 ms ------ Mitral valve Peak E vel 82 cm/s ------ Peak A vel 97.3 cm/s ------ Decelerati 310 ms 150-23 on time 0 Peak 3 mm Hg ------ gradient, D Peak E/A 0.8 ------ ratio Max  regurg 248 cm/s ------ vel Tricuspid valve Regurg 210 cm/s ------ peak vel Peak RV-RA 18 mm Hg ------ gradient, S Systemic veins Estimated 3 mm Hg ------ CVP Right ventricle Pressure, 21 mm Hg <30 S Sa vel, 12.1 cm/s ------ lat ann, tiss DP  ------------------------------------------------------------ Prepared and Electronically Authenticated by  [name] 2015-05-11T09:[id number]:[id number]     Cardiac Catheterization Procedure Note   Name: LANE KJOS   MRN: [id number]   DOB: 1935/10/01   Procedure: Right Heart Cath, Catheter placement for coronary angiography, Selective Coronary Angiography, aortic root angiography   Indication: Severe symptomatic aortic stenosis   Procedural Details: The right wrist was prepped, draped, and anesthetized with 1% lidocaine. Using the modified Seldinger technique a 5/6 French sheath was placed in the right radial artery and a 5 French sheath was placed in the right antecubital vein by changing out a peripheral IV over a 0.018 wire. A Swan-Ganz catheter was used for the right heart catheterization. Standard protocol was followed for recording of right heart pressures and sampling of oxygen saturations. Fick cardiac output was calculated. Standard Judkins catheters were used for selective coronary angiography and aortic root angiography. An anomalous circumflex was identified and selectively injected with an AR-1 catheter. There were no immediate procedural complications. The patient was transferred to the post catheterization recovery area for further monitoring.   Procedural Findings:   Hemodynamics   RA 2   RV 36/8   PA 31/12 mean 21   PCWP 8   LV not recorded   AO 117/64 mean 87   Oxygen saturations:   PA 77   AO 94   Cardiac Output (Fick) 6.9   Cardiac Index (Fick) 4.3   Coronary angiography:   Coronary dominance: right   Left mainstem: The LAD and left circumflex have separate ostia.   Left anterior descending (LAD): There is mild  calcification of the proximal LAD without significant stenosis. The first diagonal branch exhibits eccentric heavy calcification with associated 90% stenosis. The LAD at the origin of the first diagonal has an eccentric 50% stenosis. Just after the first septal perforator the LAD has 80-90% stenosis. The second diagonal branch has 75% ostial stenosis. The mid LAD has mild nonobstructive disease. The LAD reaches the left ventricular apex.   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 There is heavy associated calcification. There is 50-60% stenosis in  the proximal circumflex approximately 1-2 cm beyond its origin. There is another 50% stenosis at the origin of the obtuse marginal branches. There are 2 major OM branches without significant disease noted.   Right coronary artery (RCA): This is a dominant vessel. There is moderate diffuse calcification. The proximal vessel has 30-40% stenosis. The mid vessel has 30-40% stenosis. The distal vessel is patent. The PDA branch is patent without significant stenosis.   Left ventriculography: Deferred   Aortic root angiography: The aortic valve is severely calcified and restricted. There is mild aortic insufficiency. There does not appear to be any significant dilatation of the ascending aorta. There is heavy calcification of all 3 coronary arteries.   Final Conclusions:  1. Known severe aortic stenosis with mild aortic insufficiency visualized by aortic root angiography   2. Essentially normal intra-coronary hemodynamics based on right heart cath data   3. 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 The RCA has mild, non-obstructive disease. I agree that CABG and AVR is the best treatment for her to prevent further ischemia and infarction and progression of congestive heart failure symptoms. I discussed the pros and cons of mechanical and tissue valves and my recommendation for a tissue valve given her age of 89. She is in agreement. I discussed the operative procedure with the patient and family including alternatives, benefits and risks; including but not limited to bleeding, blood transfusion, infection, stroke, myocardial infarction, graft failure, heart block requiring a permanent pacemaker, organ dysfunction, and death.  Era Bumpers understands and agrees to proceed.    Plan:   AVR and CABG on 09/24/2013.

## 2013-09-24 ENCOUNTER — Inpatient Hospital Stay (HOSPITAL_COMMUNITY): Payer: Medicare Other

## 2013-09-24 ENCOUNTER — Inpatient Hospital Stay (HOSPITAL_COMMUNITY): Payer: Medicare Other | Admitting: Anesthesiology

## 2013-09-24 ENCOUNTER — Encounter (HOSPITAL_COMMUNITY): Payer: Medicare Other | Admitting: Anesthesiology

## 2013-09-24 ENCOUNTER — Encounter (HOSPITAL_COMMUNITY): Payer: Self-pay | Admitting: *Deleted

## 2013-09-24 ENCOUNTER — Encounter (HOSPITAL_COMMUNITY)
Admission: RE | Disposition: A | Discharge status: Home / Self Care | Payer: Medicare Other | Source: Ambulatory Visit | Attending: Surgery

## 2013-09-24 ENCOUNTER — Inpatient Hospital Stay (HOSPITAL_COMMUNITY)
Admission: RE | Admit: 2013-09-24 | Discharge: 2013-09-28 | DRG: 220 | Disposition: A | Discharge status: Home / Self Care | Payer: Medicare Other | Source: Ambulatory Visit | Attending: Surgery | Admitting: Surgery

## 2013-09-24 DIAGNOSIS — I359 Nonrheumatic aortic valve disorder, unspecified: Secondary | ICD-10-CM

## 2013-09-24 DIAGNOSIS — Z951 Presence of aortocoronary bypass graft: Secondary | ICD-10-CM

## 2013-09-24 DIAGNOSIS — Z96659 Presence of unspecified artificial knee joint: Secondary | ICD-10-CM

## 2013-09-24 DIAGNOSIS — Z79899 Other long term (current) drug therapy: Secondary | ICD-10-CM

## 2013-09-24 DIAGNOSIS — E039 Hypothyroidism, unspecified: Secondary | ICD-10-CM | POA: Diagnosis present

## 2013-09-24 DIAGNOSIS — R070 Pain in throat: Secondary | ICD-10-CM | POA: Diagnosis not present

## 2013-09-24 DIAGNOSIS — Z882 Allergy status to sulfonamides status: Secondary | ICD-10-CM

## 2013-09-24 DIAGNOSIS — I350 Nonrheumatic aortic (valve) stenosis: Secondary | ICD-10-CM

## 2013-09-24 DIAGNOSIS — I2510 Atherosclerotic heart disease of native coronary artery without angina pectoris: Principal | ICD-10-CM | POA: Diagnosis present

## 2013-09-24 DIAGNOSIS — I2584 Coronary atherosclerosis due to calcified coronary lesion: Secondary | ICD-10-CM | POA: Diagnosis present

## 2013-09-24 DIAGNOSIS — R002 Palpitations: Secondary | ICD-10-CM | POA: Diagnosis present

## 2013-09-24 DIAGNOSIS — E8779 Other fluid overload: Secondary | ICD-10-CM | POA: Diagnosis not present

## 2013-09-24 DIAGNOSIS — Z9089 Acquired absence of other organs: Secondary | ICD-10-CM

## 2013-09-24 DIAGNOSIS — Q231 Congenital insufficiency of aortic valve: Secondary | ICD-10-CM

## 2013-09-24 DIAGNOSIS — Z881 Allergy status to other antibiotic agents status: Secondary | ICD-10-CM

## 2013-09-24 DIAGNOSIS — D62 Acute posthemorrhagic anemia: Secondary | ICD-10-CM | POA: Diagnosis not present

## 2013-09-24 DIAGNOSIS — Z7982 Long term (current) use of aspirin: Secondary | ICD-10-CM

## 2013-09-24 DIAGNOSIS — D696 Thrombocytopenia, unspecified: Secondary | ICD-10-CM | POA: Diagnosis present

## 2013-09-24 HISTORY — PX: AORTIC VALVE REPLACEMENT: SHX41

## 2013-09-24 HISTORY — PX: INTRAOPERATIVE TRANSESOPHAGEAL ECHOCARDIOGRAM: SHX5062

## 2013-09-24 HISTORY — PX: CORONARY ARTERY BYPASS GRAFT: SHX141

## 2013-09-24 LAB — POCT I-STAT, CHEM 8
BUN: 9 mg/dL (ref 6–23)
CHLORIDE: 104 meq/L (ref 96–112)
Calcium, Ion: 1.01 mmol/L — ABNORMAL LOW (ref 1.13–1.30)
Creatinine, Ser: 0.5 mg/dL (ref 0.50–1.10)
Glucose, Bld: 152 mg/dL — ABNORMAL HIGH (ref 70–99)
HEMATOCRIT: 32 % — AB (ref 36.0–46.0)
HEMOGLOBIN: 10.9 g/dL — AB (ref 12.0–15.0)
POTASSIUM: 4.3 meq/L (ref 3.7–5.3)
Sodium: 137 mEq/L (ref 137–147)
TCO2: 22 mmol/L (ref 0–100)

## 2013-09-24 LAB — POCT I-STAT 4, (NA,K, GLUC, HGB,HCT)
GLUCOSE: 94 mg/dL (ref 70–99)
GLUCOSE: 116 mg/dL — AB (ref 70–99)
GLUCOSE: 126 mg/dL — AB (ref 70–99)
GLUCOSE: 93 mg/dL (ref 70–99)
Glucose, Bld: 105 mg/dL — ABNORMAL HIGH (ref 70–99)
Glucose, Bld: 117 mg/dL — ABNORMAL HIGH (ref 70–99)
Glucose, Bld: 117 mg/dL — ABNORMAL HIGH (ref 70–99)
HCT: 35 % — ABNORMAL LOW (ref 36.0–46.0)
HCT: 33 % — ABNORMAL LOW (ref 36.0–46.0)
HCT: 27 % — ABNORMAL LOW (ref 36.0–46.0)
HCT: 26 % — ABNORMAL LOW (ref 36.0–46.0)
HCT: 34 % — ABNORMAL LOW (ref 36.0–46.0)
HEMATOCRIT: 22 % — AB (ref 36.0–46.0)
HEMATOCRIT: 27 % — AB (ref 36.0–46.0)
HEMOGLOBIN: 11.2 g/dL — AB (ref 12.0–15.0)
HEMOGLOBIN: 7.5 g/dL — AB (ref 12.0–15.0)
HEMOGLOBIN: 8.8 g/dL — AB (ref 12.0–15.0)
Hemoglobin: 11.9 g/dL — ABNORMAL LOW (ref 12.0–15.0)
Hemoglobin: 9.2 g/dL — ABNORMAL LOW (ref 12.0–15.0)
Hemoglobin: 9.2 g/dL — ABNORMAL LOW (ref 12.0–15.0)
Hemoglobin: 11.6 g/dL — ABNORMAL LOW (ref 12.0–15.0)
POTASSIUM: 4.6 meq/L (ref 3.7–5.3)
Potassium: 3.7 mEq/L (ref 3.7–5.3)
Potassium: 3.6 mEq/L — ABNORMAL LOW (ref 3.7–5.3)
Potassium: 3.7 mEq/L (ref 3.7–5.3)
Potassium: 4.3 mEq/L (ref 3.7–5.3)
Potassium: 3.7 mEq/L (ref 3.7–5.3)
Potassium: 3.4 mEq/L — ABNORMAL LOW (ref 3.7–5.3)
SODIUM: 141 meq/L (ref 137–147)
SODIUM: 137 meq/L (ref 137–147)
Sodium: 140 mEq/L (ref 137–147)
Sodium: 142 mEq/L (ref 137–147)
Sodium: 138 mEq/L (ref 137–147)
Sodium: 140 mEq/L (ref 137–147)
Sodium: 139 mEq/L (ref 137–147)

## 2013-09-24 LAB — PROTIME-INR
INR: 1.48 (ref 0.00–1.49)
Prothrombin Time: 17.5 seconds — ABNORMAL HIGH (ref 11.6–15.2)

## 2013-09-24 LAB — CREATININE, SERUM
Creatinine, Ser: 0.5 mg/dL (ref 0.50–1.10)
GFR calc Af Amer: >90 mL/min (ref >90)

## 2013-09-24 LAB — POCT I-STAT 3, ART BLOOD GAS (G3+)
ACID-BASE DEFICIT: 2 mmol/L (ref 0.0–2.0)
Acid-Base Excess: 4 mmol/L — ABNORMAL HIGH (ref 0.0–2.0)
Acid-Base Excess: 1 mmol/L (ref 0.0–2.0)
Acid-base deficit: 4 mmol/L — ABNORMAL HIGH (ref 0.0–2.0)
Acid-base deficit: 5 mmol/L — ABNORMAL HIGH (ref 0.0–2.0)
BICARBONATE: 26.8 meq/L — AB (ref 20.0–24.0)
BICARBONATE: 20.6 meq/L (ref 20.0–24.0)
BICARBONATE: 21.9 meq/L (ref 20.0–24.0)
Bicarbonate: 28.5 mEq/L — ABNORMAL HIGH (ref 20.0–24.0)
Bicarbonate: 23 mEq/L (ref 20.0–24.0)
O2 SAT: 100 %
O2 Saturation: 100 %
O2 Saturation: 100 %
O2 Saturation: 97 %
O2 Saturation: 99 %
PCO2 ART: 45.5 mmHg — AB (ref 35.0–45.0)
PCO2 ART: 36.6 mmHg (ref 35.0–45.0)
PH ART: 7.377 (ref 7.350–7.450)
PH ART: 7.342 — AB (ref 7.350–7.450)
PO2 ART: 524 mmHg — AB (ref 80.0–100.0)
PO2 ART: 348 mmHg — AB (ref 80.0–100.0)
PO2 ART: 164 mmHg — AB (ref 80.0–100.0)
PO2 ART: 166 mmHg — AB (ref 80.0–100.0)
PO2 ART: 99 mmHg (ref 80.0–100.0)
Patient temperature: 35.1
Patient temperature: 37.1
Patient temperature: 37.2
TCO2: 30 mmol/L (ref 0–100)
TCO2: 28 mmol/L (ref 0–100)
TCO2: 24 mmol/L (ref 0–100)
TCO2: 22 mmol/L (ref 0–100)
TCO2: 23 mmol/L (ref 0–100)
pCO2 arterial: 42.2 mmHg (ref 35.0–45.0)
pCO2 arterial: 38.7 mmHg (ref 35.0–45.0)
pCO2 arterial: 40.5 mmHg (ref 35.0–45.0)
pH, Arterial: 7.438 (ref 7.350–7.450)
pH, Arterial: 7.398 (ref 7.350–7.450)
pH, Arterial: 7.334 — ABNORMAL LOW (ref 7.350–7.450)

## 2013-09-24 LAB — HEMOGLOBIN AND HEMATOCRIT, BLOOD
HCT: 24.3 % — ABNORMAL LOW (ref 36.0–46.0)
HEMOGLOBIN: 8.3 g/dL — AB (ref 12.0–15.0)

## 2013-09-24 LAB — CBC
HCT: 32.5 % — ABNORMAL LOW (ref 36.0–46.0)
HEMATOCRIT: 29.2 % — AB (ref 36.0–46.0)
HEMOGLOBIN: 11.1 g/dL — AB (ref 12.0–15.0)
Hemoglobin: 10.1 g/dL — ABNORMAL LOW (ref 12.0–15.0)
MCH: 31.8 pg (ref 26.0–34.0)
MCH: 32 pg (ref 26.0–34.0)
MCHC: 34.2 g/dL (ref 30.0–36.0)
MCHC: 34.6 g/dL (ref 30.0–36.0)
MCV: 93.1 fL (ref 78.0–100.0)
MCV: 92.4 fL (ref 78.0–100.0)
Platelets: 118 10*3/uL — ABNORMAL LOW (ref 150–400)
Platelets: 136 10*3/uL — ABNORMAL LOW (ref 150–400)
RBC: 3.49 MIL/uL — AB (ref 3.87–5.11)
RBC: 3.16 MIL/uL — AB (ref 3.87–5.11)
RDW: 15 % (ref 11.5–15.5)
RDW: 15.6 % — ABNORMAL HIGH (ref 11.5–15.5)
WBC: 16.1 10*3/uL — ABNORMAL HIGH (ref 4.0–10.5)
WBC: 15.2 10*3/uL — ABNORMAL HIGH (ref 4.0–10.5)

## 2013-09-24 LAB — PLATELET COUNT: PLATELETS: 107 10*3/uL — AB (ref 150–400)

## 2013-09-24 LAB — GLUCOSE, CAPILLARY
GLUCOSE-CAPILLARY: 122 mg/dL — AB (ref 70–99)
Glucose-Capillary: 112 mg/dL — ABNORMAL HIGH (ref 70–99)
Glucose-Capillary: 90 mg/dL (ref 70–99)
Glucose-Capillary: 133 mg/dL — ABNORMAL HIGH (ref 70–99)

## 2013-09-24 LAB — APTT: aPTT: 38 seconds — ABNORMAL HIGH (ref 24–37)

## 2013-09-24 LAB — POCT I-STAT 3, VENOUS BLOOD GAS (G3P V)
Bicarbonate: 25.4 mEq/L — ABNORMAL HIGH (ref 20.0–24.0)
O2 Saturation: 92 %
PCO2 VEN: 46.7 mmHg (ref 45.0–50.0)
PH VEN: 7.343 — AB (ref 7.250–7.300)
TCO2: 27 mmol/L (ref 0–100)
pO2, Ven: 68 mmHg — ABNORMAL HIGH (ref 30.0–45.0)

## 2013-09-24 LAB — PREPARE RBC (CROSSMATCH)

## 2013-09-24 LAB — MAGNESIUM: Magnesium: 3.2 mg/dL — ABNORMAL HIGH (ref 1.5–2.5)

## 2013-09-24 SURGERY — REPLACEMENT, AORTIC VALVE, OPEN
Anesthesia: General | Site: Chest

## 2013-09-24 MED ORDER — FENTANYL CITRATE 0.05 MG/ML IJ SOLN
INTRAMUSCULAR | Status: AC
  Filled 2013-09-24: qty 5

## 2013-09-24 MED ORDER — OXYCODONE HCL 5 MG PO TABS: 5.0000 mg | ORAL_TABLET | ORAL | Status: DC | PRN

## 2013-09-24 MED ORDER — ACETAMINOPHEN 650 MG RE SUPP
650.0000 mg | Freq: Once | RECTAL | Status: AC
  Administered 2013-09-24: 650 mg via RECTAL

## 2013-09-24 MED ORDER — ACETAMINOPHEN 500 MG PO TABS
1000.0000 mg | ORAL_TABLET | Freq: Four times a day (QID) | ORAL | Status: DC
  Administered 2013-09-25 – 2013-09-26 (×2): 1000 mg via ORAL
  Filled 2013-09-24 (×8): qty 2

## 2013-09-24 MED ORDER — LIDOCAINE HCL (CARDIAC) 20 MG/ML IV SOLN
INTRAVENOUS | Status: DC | PRN
  Administered 2013-09-24: 50 mg via INTRAVENOUS

## 2013-09-24 MED ORDER — ROCURONIUM BROMIDE 100 MG/10ML IV SOLN
INTRAVENOUS | Status: DC | PRN
  Administered 2013-09-24: 20 mg via INTRAVENOUS
  Administered 2013-09-24: 30 mg via INTRAVENOUS
  Administered 2013-09-24 (×2): 20 mg via INTRAVENOUS
  Administered 2013-09-24: 50 mg via INTRAVENOUS
  Administered 2013-09-24: 20 mg via INTRAVENOUS

## 2013-09-24 MED ORDER — MIDAZOLAM HCL 2 MG/2ML IJ SOLN
INTRAMUSCULAR | Status: AC
  Filled 2013-09-24: qty 2

## 2013-09-24 MED ORDER — BISACODYL 5 MG PO TBEC
10.0000 mg | DELAYED_RELEASE_TABLET | Freq: Every day | ORAL | Status: DC
  Administered 2013-09-25 – 2013-09-26 (×2): 10 mg via ORAL
  Filled 2013-09-24 (×2): qty 2

## 2013-09-24 MED ORDER — THROMBIN 20000 UNITS EX SOLR
CUTANEOUS | Status: DC | PRN
  Administered 2013-09-24: 20000 [IU] via TOPICAL

## 2013-09-24 MED ORDER — ACETAMINOPHEN 160 MG/5ML PO SOLN: 1000.0000 mg | Freq: Four times a day (QID) | ORAL | Status: DC

## 2013-09-24 MED ORDER — THROMBIN 20000 UNITS EX SOLR
OROMUCOSAL | Status: DC | PRN
  Administered 2013-09-24 (×3): via TOPICAL

## 2013-09-24 MED ORDER — ADULT MULTIVITAMIN W/MINERALS CH
1.0000 | ORAL_TABLET | Freq: Every day | ORAL | Status: DC
  Administered 2013-09-27 – 2013-09-28 (×2): 1 via ORAL
  Filled 2013-09-24 (×4): qty 1

## 2013-09-24 MED ORDER — ALBUMIN HUMAN 5 % IV SOLN
250.0000 mL | INTRAVENOUS | Status: AC | PRN
  Administered 2013-09-24 – 2013-09-25 (×4): 250 mL via INTRAVENOUS
  Filled 2013-09-24: qty 250

## 2013-09-24 MED ORDER — BISACODYL 10 MG RE SUPP: 10.0000 mg | Freq: Every day | RECTAL | Status: DC

## 2013-09-24 MED ORDER — PHENYLEPHRINE HCL 10 MG/ML IJ SOLN
0.0000 ug/min | INTRAVENOUS | Status: DC
  Filled 2013-09-24 (×2): qty 2

## 2013-09-24 MED ORDER — HEMOSTATIC AGENTS (NO CHARGE) OPTIME
TOPICAL | Status: DC | PRN
  Administered 2013-09-24: 1 via TOPICAL

## 2013-09-24 MED ORDER — SODIUM CHLORIDE 0.9 % IJ SOLN
3.0000 mL | Freq: Two times a day (BID) | INTRAMUSCULAR | Status: DC
  Administered 2013-09-25 (×2): 3 mL via INTRAVENOUS

## 2013-09-24 MED ORDER — 0.9 % SODIUM CHLORIDE (POUR BTL) OPTIME
TOPICAL | Status: DC | PRN
  Administered 2013-09-24: 7000 mL

## 2013-09-24 MED ORDER — PROPOFOL 10 MG/ML IV BOLUS
INTRAVENOUS | Status: AC
  Filled 2013-09-24: qty 20

## 2013-09-24 MED ORDER — MIDAZOLAM HCL 10 MG/2ML IJ SOLN
INTRAMUSCULAR | Status: AC
  Filled 2013-09-24: qty 2

## 2013-09-24 MED ORDER — ONDANSETRON HCL 4 MG/2ML IJ SOLN
4.0000 mg | Freq: Four times a day (QID) | INTRAMUSCULAR | Status: DC | PRN
  Administered 2013-09-24 – 2013-09-25 (×2): 4 mg via INTRAVENOUS
  Filled 2013-09-24 (×2): qty 2

## 2013-09-24 MED ORDER — VANCOMYCIN HCL IN DEXTROSE 1-5 GM/200ML-% IV SOLN
1000.0000 mg | Freq: Once | INTRAVENOUS | Status: AC
  Administered 2013-09-24: 1000 mg via INTRAVENOUS
  Filled 2013-09-24: qty 200

## 2013-09-24 MED ORDER — MAGNESIUM SULFATE 4000MG/100ML IJ SOLN
4.0000 g | Freq: Once | INTRAMUSCULAR | Status: AC
  Administered 2013-09-24: 4 g via INTRAVENOUS
  Filled 2013-09-24: qty 100

## 2013-09-24 MED ORDER — METOPROLOL TARTRATE 12.5 MG HALF TABLET
12.5000 mg | ORAL_TABLET | Freq: Two times a day (BID) | ORAL | Status: DC
  Filled 2013-09-24 (×3): qty 1

## 2013-09-24 MED ORDER — INSULIN ASPART 100 UNIT/ML ~~LOC~~ SOLN
0.0000 [IU] | SUBCUTANEOUS | Status: DC
  Administered 2013-09-24 – 2013-09-25 (×2): 2 [IU] via SUBCUTANEOUS

## 2013-09-24 MED ORDER — DEXTROSE 5 % IV SOLN
1.5000 g | Freq: Two times a day (BID) | INTRAVENOUS | Status: AC
  Administered 2013-09-24 – 2013-09-26 (×4): 1.5 g via INTRAVENOUS
  Filled 2013-09-24 (×4): qty 1.5

## 2013-09-24 MED ORDER — DEXMEDETOMIDINE HCL IN NACL 200 MCG/50ML IV SOLN
0.1000 ug/kg/h | INTRAVENOUS | Status: DC
  Filled 2013-09-24: qty 50

## 2013-09-24 MED ORDER — HEPARIN SODIUM (PORCINE) 1000 UNIT/ML IJ SOLN
INTRAMUSCULAR | Status: DC | PRN
  Administered 2013-09-24: 14000 [IU] via INTRAVENOUS

## 2013-09-24 MED ORDER — PROPOFOL 10 MG/ML IV BOLUS
INTRAVENOUS | Status: DC | PRN
  Administered 2013-09-24: 50 mg via INTRAVENOUS

## 2013-09-24 MED ORDER — LACTATED RINGERS IV SOLN: INTRAVENOUS | Status: DC

## 2013-09-24 MED ORDER — ASPIRIN EC 325 MG PO TBEC
325.0000 mg | DELAYED_RELEASE_TABLET | Freq: Every day | ORAL | Status: DC
  Administered 2013-09-25 – 2013-09-28 (×4): 325 mg via ORAL
  Filled 2013-09-24 (×4): qty 1

## 2013-09-24 MED ORDER — DOCUSATE SODIUM 100 MG PO CAPS
200.0000 mg | ORAL_CAPSULE | Freq: Every day | ORAL | Status: DC
  Administered 2013-09-25 – 2013-09-28 (×4): 200 mg via ORAL
  Filled 2013-09-24 (×4): qty 2

## 2013-09-24 MED ORDER — FOLIC ACID 1 MG PO TABS
1.0000 mg | ORAL_TABLET | Freq: Every day | ORAL | Status: DC
  Administered 2013-09-25 – 2013-09-28 (×3): 1 mg via ORAL
  Filled 2013-09-24 (×4): qty 1

## 2013-09-24 MED ORDER — SODIUM CHLORIDE 0.9 % IV SOLN: 250.0000 mL | INTRAVENOUS | Status: DC

## 2013-09-24 MED ORDER — MORPHINE SULFATE 2 MG/ML IJ SOLN
2.0000 mg | INTRAMUSCULAR | Status: DC | PRN
  Administered 2013-09-24 – 2013-09-25 (×7): 2 mg via INTRAVENOUS
  Filled 2013-09-24 (×7): qty 1

## 2013-09-24 MED ORDER — SODIUM CHLORIDE 0.9 % IV SOLN
INTRAVENOUS | Status: DC | PRN
  Administered 2013-09-24: 13:00:00 via INTRAVENOUS

## 2013-09-24 MED ORDER — LACTATED RINGERS IV SOLN
INTRAVENOUS | Status: DC | PRN
  Administered 2013-09-24: 07:00:00 via INTRAVENOUS

## 2013-09-24 MED ORDER — METOPROLOL TARTRATE 1 MG/ML IV SOLN: 2.5000 mg | INTRAVENOUS | Status: DC | PRN

## 2013-09-24 MED ORDER — MIDAZOLAM HCL 2 MG/2ML IJ SOLN: 2.0000 mg | INTRAMUSCULAR | Status: DC | PRN

## 2013-09-24 MED ORDER — FENTANYL CITRATE 0.05 MG/ML IJ SOLN
INTRAMUSCULAR | Status: DC | PRN
  Administered 2013-09-24 (×5): 100 ug via INTRAVENOUS
  Administered 2013-09-24 (×2): 250 ug via INTRAVENOUS

## 2013-09-24 MED ORDER — LACTATED RINGERS IV SOLN: 500.0000 mL | Freq: Once | INTRAVENOUS | Status: AC | PRN

## 2013-09-24 MED ORDER — MORPHINE SULFATE 2 MG/ML IJ SOLN
1.0000 mg | INTRAMUSCULAR | Status: AC | PRN
  Administered 2013-09-24: 2 mg via INTRAVENOUS
  Filled 2013-09-24: qty 1

## 2013-09-24 MED ORDER — METOPROLOL TARTRATE 25 MG/10 ML ORAL SUSPENSION
12.5000 mg | Freq: Two times a day (BID) | ORAL | Status: DC
  Filled 2013-09-24 (×3): qty 5

## 2013-09-24 MED ORDER — MIDAZOLAM HCL 5 MG/5ML IJ SOLN
INTRAMUSCULAR | Status: DC | PRN
  Administered 2013-09-24: 1 mg via INTRAVENOUS
  Administered 2013-09-24: 2 mg via INTRAVENOUS
  Administered 2013-09-24: 3 mg via INTRAVENOUS
  Administered 2013-09-24 (×3): 2 mg via INTRAVENOUS

## 2013-09-24 MED ORDER — SODIUM CHLORIDE 0.9 % IV SOLN
INTRAVENOUS | Status: DC
  Filled 2013-09-24: qty 1

## 2013-09-24 MED ORDER — THROMBIN 20000 UNITS EX SOLR
CUTANEOUS | Status: AC
  Filled 2013-09-24: qty 20000

## 2013-09-24 MED ORDER — PROTAMINE SULFATE 10 MG/ML IV SOLN
INTRAVENOUS | Status: AC
Start: 2013-09-24 — End: 2013-09-24
  Filled 2013-09-24: qty 5

## 2013-09-24 MED ORDER — FOLIC ACID 800 MCG PO TABS: 400.0000 ug | ORAL_TABLET | Freq: Every day | ORAL | Status: DC

## 2013-09-24 MED ORDER — ACETAMINOPHEN 160 MG/5ML PO SOLN: 650.0000 mg | Freq: Once | ORAL | Status: AC

## 2013-09-24 MED ORDER — ASPIRIN 81 MG PO CHEW: 324.0000 mg | CHEWABLE_TABLET | Freq: Every day | ORAL | Status: DC

## 2013-09-24 MED ORDER — PANTOPRAZOLE SODIUM 40 MG PO TBEC
40.0000 mg | DELAYED_RELEASE_TABLET | Freq: Every day | ORAL | Status: DC
  Administered 2013-09-26: 40 mg via ORAL
  Filled 2013-09-24: qty 1

## 2013-09-24 MED ORDER — FAMOTIDINE IN NACL 20-0.9 MG/50ML-% IV SOLN
20.0000 mg | Freq: Two times a day (BID) | INTRAVENOUS | Status: AC
  Administered 2013-09-24: 20 mg via INTRAVENOUS

## 2013-09-24 MED ORDER — PROTAMINE SULFATE 10 MG/ML IV SOLN
INTRAVENOUS | Status: DC | PRN
  Administered 2013-09-24: 10 mg via INTRAVENOUS
  Administered 2013-09-24 (×3): 30 mg via INTRAVENOUS
  Administered 2013-09-24: 10 mg via INTRAVENOUS
  Administered 2013-09-24: 30 mg via INTRAVENOUS

## 2013-09-24 MED ORDER — SODIUM CHLORIDE 0.9 % IJ SOLN: 3.0000 mL | INTRAMUSCULAR | Status: DC | PRN

## 2013-09-24 MED ORDER — ESTRADIOL 1 MG PO TABS
0.5000 mg | ORAL_TABLET | Freq: Every day | ORAL | Status: DC
  Administered 2013-09-25 – 2013-09-26 (×2): 0.5 mg via ORAL
  Administered 2013-09-27: 11:00:00 via ORAL
  Administered 2013-09-28: 0.5 mg via ORAL
  Filled 2013-09-24 (×6): qty 0.5

## 2013-09-24 MED ORDER — SODIUM CHLORIDE 0.45 % IV SOLN
INTRAVENOUS | Status: DC
  Administered 2013-09-24: 18:00:00 via INTRAVENOUS

## 2013-09-24 MED ORDER — SODIUM CHLORIDE 0.9 % IV SOLN: INTRAVENOUS | Status: DC

## 2013-09-24 MED ORDER — POTASSIUM CHLORIDE 10 MEQ/50ML IV SOLN
10.0000 meq | INTRAVENOUS | Status: AC
  Administered 2013-09-24 (×3): 10 meq via INTRAVENOUS

## 2013-09-24 MED ORDER — LEVOTHYROXINE SODIUM 50 MCG PO TABS
50.0000 ug | ORAL_TABLET | Freq: Every day | ORAL | Status: DC
  Administered 2013-09-25 – 2013-09-28 (×4): 50 ug via ORAL
  Filled 2013-09-24 (×5): qty 1

## 2013-09-24 MED ORDER — INSULIN REGULAR BOLUS VIA INFUSION
0.0000 [IU] | Freq: Three times a day (TID) | INTRAVENOUS | Status: DC
  Filled 2013-09-24: qty 10

## 2013-09-24 MED FILL — Sodium Chloride IV Soln 0.9%: INTRAVENOUS | Qty: 2000 | Status: AC

## 2013-09-24 MED FILL — Lidocaine HCl IV Inj 20 MG/ML: INTRAVENOUS | Qty: 5 | Status: AC

## 2013-09-24 MED FILL — Mannitol IV Soln 20%: INTRAVENOUS | Qty: 500 | Status: AC

## 2013-09-24 MED FILL — Electrolyte-R (PH 7.4) Solution: INTRAVENOUS | Qty: 3000 | Status: AC

## 2013-09-24 MED FILL — Heparin Sodium (Porcine) Inj 1000 Unit/ML: INTRAMUSCULAR | Qty: 60 | Status: AC

## 2013-09-24 MED FILL — Sodium Bicarbonate IV Soln 8.4%: INTRAVENOUS | Qty: 50 | Status: AC

## 2013-09-24 SURGICAL SUPPLY — 119 items
ADAPTER CARDIO PERF ANTE/RETRO (ADAPTER) ×4 IMPLANT
ADH SKN CLS APL DERMABOND .7 (GAUZE/BANDAGES/DRESSINGS) ×2
ADPR PRFSN 84XANTGRD RTRGD (ADAPTER) ×2
ATTRACTOMAT 16X20 MAGNETIC DRP (DRAPES) ×4 IMPLANT
BAG DECANTER FOR FLEXI CONT (MISCELLANEOUS) ×4 IMPLANT
BANDAGE ELASTIC 4 VELCRO ST LF (GAUZE/BANDAGES/DRESSINGS) ×4 IMPLANT
BANDAGE ELASTIC 6 VELCRO ST LF (GAUZE/BANDAGES/DRESSINGS) ×4 IMPLANT
BANDAGE GAUZE ELAST BULKY 4 IN (GAUZE/BANDAGES/DRESSINGS) ×4 IMPLANT
BASKET HEART  (ORDER IN 25'S) (MISCELLANEOUS) ×1
BASKET HEART (ORDER IN 25'S) (MISCELLANEOUS) ×1
BASKET HEART (ORDER IN 25S) (MISCELLANEOUS) ×2 IMPLANT
BLADE STERNUM SYSTEM 6 (BLADE) ×4 IMPLANT
BLADE SURG 15 STRL LF DISP TIS (BLADE) ×2 IMPLANT
BLADE SURG 15 STRL SS (BLADE) ×4
BNDG GAUZE ELAST 4 BULKY (GAUZE/BANDAGES/DRESSINGS) ×3 IMPLANT
CANISTER SUCTION 2500CC (MISCELLANEOUS) ×4 IMPLANT
CANNULA GUNDRY RCSP 15FR (MISCELLANEOUS) ×4 IMPLANT
CARDIAC SUCTION (MISCELLANEOUS) ×4 IMPLANT
CATH ROBINSON RED A/P 18FR (CATHETERS) ×12 IMPLANT
CATH THORACIC 28FR (CATHETERS) ×4 IMPLANT
CATH THORACIC 36FR (CATHETERS) ×4 IMPLANT
CATH THORACIC 36FR RT ANG (CATHETERS) ×4 IMPLANT
CLIP FOGARTY SPRING 6M (CLIP) ×2 IMPLANT
CLIP TI MEDIUM 24 (CLIP) IMPLANT
CLIP TI WIDE RED SMALL 24 (CLIP) ×2 IMPLANT
CONT SPEC STER OR (MISCELLANEOUS) ×4 IMPLANT
CONT SPECI 4OZ STER CLIK (MISCELLANEOUS) ×3 IMPLANT
COVER SURGICAL LIGHT HANDLE (MISCELLANEOUS) ×4 IMPLANT
CRADLE DONUT ADULT HEAD (MISCELLANEOUS) ×4 IMPLANT
DERMABOND ADVANCED (GAUZE/BANDAGES/DRESSINGS) ×2
DERMABOND ADVANCED .7 DNX12 (GAUZE/BANDAGES/DRESSINGS) IMPLANT
DRAPE CARDIOVASCULAR INCISE (DRAPES) ×4
DRAPE INCISE IOBAN 66X45 STRL (DRAPES) ×2 IMPLANT
DRAPE SLUSH/WARMER DISC (DRAPES) ×4 IMPLANT
DRAPE SRG 135X102X78XABS (DRAPES) ×2 IMPLANT
DRSG COVADERM 4X14 (GAUZE/BANDAGES/DRESSINGS) ×4 IMPLANT
ELECT CAUTERY BLADE 6.4 (BLADE) ×4 IMPLANT
ELECT REM PT RETURN 9FT ADLT (ELECTROSURGICAL) ×8
ELECTRODE REM PT RTRN 9FT ADLT (ELECTROSURGICAL) ×4 IMPLANT
GLOVE BIO SURGEON STRL SZ 6 (GLOVE) IMPLANT
GLOVE BIO SURGEON STRL SZ 6.5 (GLOVE) ×8 IMPLANT
GLOVE BIO SURGEON STRL SZ7 (GLOVE) IMPLANT
GLOVE BIO SURGEON STRL SZ7.5 (GLOVE) IMPLANT
GLOVE BIO SURGEONS STRL SZ 6.5 (GLOVE) ×8
GLOVE BIOGEL PI IND STRL 6 (GLOVE) IMPLANT
GLOVE BIOGEL PI IND STRL 6.5 (GLOVE) IMPLANT
GLOVE BIOGEL PI IND STRL 7.0 (GLOVE) ×6 IMPLANT
GLOVE BIOGEL PI INDICATOR 6 (GLOVE)
GLOVE BIOGEL PI INDICATOR 6.5 (GLOVE) ×8
GLOVE BIOGEL PI INDICATOR 7.0 (GLOVE) ×12
GLOVE EUDERMIC 7 POWDERFREE (GLOVE) ×11 IMPLANT
GLOVE ORTHO TXT STRL SZ7.5 (GLOVE) IMPLANT
GOWN STRL REUS W/ TWL LRG LVL3 (GOWN DISPOSABLE) ×8 IMPLANT
GOWN STRL REUS W/ TWL XL LVL3 (GOWN DISPOSABLE) ×2 IMPLANT
GOWN STRL REUS W/TWL LRG LVL3 (GOWN DISPOSABLE) ×48
GOWN STRL REUS W/TWL XL LVL3 (GOWN DISPOSABLE) ×4
HEART VENT LT CURVED (MISCELLANEOUS) ×4 IMPLANT
HEMOSTAT POWDER SURGIFOAM 1G (HEMOSTASIS) ×12 IMPLANT
HEMOSTAT SURGICEL 2X14 (HEMOSTASIS) ×4 IMPLANT
INSERT FOGARTY 61MM (MISCELLANEOUS) IMPLANT
INSERT FOGARTY XLG (MISCELLANEOUS) IMPLANT
KIT BASIN OR (CUSTOM PROCEDURE TRAY) ×4 IMPLANT
KIT CATH CPB BARTLE (MISCELLANEOUS) ×4 IMPLANT
KIT ROOM TURNOVER OR (KITS) ×4 IMPLANT
KIT SUCTION CATH 14FR (SUCTIONS) ×4 IMPLANT
KIT VASOVIEW ACCESSORY VH 2004 (KITS) ×3 IMPLANT
KIT VASOVIEW W/TROCAR VH 2000 (KITS) ×4 IMPLANT
LINE VENT (MISCELLANEOUS) ×3 IMPLANT
NS IRRIG 1000ML POUR BTL (IV SOLUTION) ×26 IMPLANT
PACK OPEN HEART (CUSTOM PROCEDURE TRAY) ×4 IMPLANT
PAD ARMBOARD 7.5X6 YLW CONV (MISCELLANEOUS) ×8 IMPLANT
PAD ELECT DEFIB RADIOL ZOLL (MISCELLANEOUS) ×4 IMPLANT
PENCIL BUTTON HOLSTER BLD 10FT (ELECTRODE) ×4 IMPLANT
PUNCH AORTIC ROTATE 4.0MM (MISCELLANEOUS) IMPLANT
PUNCH AORTIC ROTATE 4.5MM 8IN (MISCELLANEOUS) ×4 IMPLANT
PUNCH AORTIC ROTATE 5MM 8IN (MISCELLANEOUS) IMPLANT
SET CARDIOPLEGIA MPS 5001102 (MISCELLANEOUS) ×2 IMPLANT
SPONGE GAUZE 4X4 12PLY (GAUZE/BANDAGES/DRESSINGS) ×8 IMPLANT
SPONGE INTESTINAL PEANUT (DISPOSABLE) IMPLANT
SPONGE LAP 18X18 X RAY DECT (DISPOSABLE) ×3 IMPLANT
SPONGE LAP 4X18 X RAY DECT (DISPOSABLE) ×4 IMPLANT
SUT BONE WAX W31G (SUTURE) ×4 IMPLANT
SUT ETHIBON 2 0 V 52N 30 (SUTURE) ×12 IMPLANT
SUT MNCRL AB 4-0 PS2 18 (SUTURE) IMPLANT
SUT PROLENE 3 0 SH DA (SUTURE) IMPLANT
SUT PROLENE 3 0 SH1 36 (SUTURE) ×6 IMPLANT
SUT PROLENE 4 0 RB 1 (SUTURE) ×28
SUT PROLENE 4 0 SH DA (SUTURE) ×3 IMPLANT
SUT PROLENE 4-0 RB1 .5 CRCL 36 (SUTURE) ×6 IMPLANT
SUT PROLENE 5 0 C 1 36 (SUTURE) IMPLANT
SUT PROLENE 6 0 C 1 24 (SUTURE) ×2 IMPLANT
SUT PROLENE 6 0 C 1 30 (SUTURE) ×2 IMPLANT
SUT PROLENE 7 0 BV 1 (SUTURE) IMPLANT
SUT PROLENE 7 0 BV1 MDA (SUTURE) ×6 IMPLANT
SUT PROLENE 8 0 BV175 6 (SUTURE) IMPLANT
SUT SILK  1 MH (SUTURE) ×4
SUT SILK 1 MH (SUTURE) IMPLANT
SUT SILK 2 0 SH CR/8 (SUTURE) ×2 IMPLANT
SUT STEEL 6MS V (SUTURE) ×4 IMPLANT
SUT STEEL STERNAL CCS#1 18IN (SUTURE) IMPLANT
SUT STEEL SZ 6 DBL 3X14 BALL (SUTURE) IMPLANT
SUT VIC AB 1 CTX 36 (SUTURE) ×12
SUT VIC AB 1 CTX36XBRD ANBCTR (SUTURE) ×4 IMPLANT
SUT VIC AB 2-0 CT1 27 (SUTURE)
SUT VIC AB 2-0 CT1 TAPERPNT 27 (SUTURE) IMPLANT
SUT VIC AB 2-0 CTX 27 (SUTURE) IMPLANT
SUT VIC AB 3-0 SH 27 (SUTURE)
SUT VIC AB 3-0 SH 27X BRD (SUTURE) IMPLANT
SUT VIC AB 3-0 X1 27 (SUTURE) IMPLANT
SUT VICRYL 4-0 PS2 18IN ABS (SUTURE) IMPLANT
SUTURE E-PAK OPEN HEART (SUTURE) ×4 IMPLANT
SYSTEM SAHARA CHEST DRAIN ATS (WOUND CARE) ×4 IMPLANT
TOWEL OR 17X24 6PK STRL BLUE (TOWEL DISPOSABLE) ×8 IMPLANT
TOWEL OR 17X26 10 PK STRL BLUE (TOWEL DISPOSABLE) ×8 IMPLANT
TRAY FOLEY IC TEMP SENS 16FR (CATHETERS) ×4 IMPLANT
TUBING INSUFFLATION 10FT LAP (TUBING) ×4 IMPLANT
UNDERPAD 30X30 INCONTINENT (UNDERPADS AND DIAPERS) ×4 IMPLANT
VALVE MAGNA EASE AORTIC 23MM (Prosthesis & Implant Heart) ×2 IMPLANT
WATER STERILE IRR 1000ML POUR (IV SOLUTION) ×8 IMPLANT

## 2013-09-24 NOTE — Transfer of Care (Signed)
Immediate Anesthesia Transfer of Care Note  Patient: Linda Parker  Procedure(s) Performed: Procedure(s): AORTIC VALVE REPLACEMENT (AVR) using Pericardial Tissue Valve (size 16mm). (N/A) CORONARY ARTERY BYPASS GRAFTING (CABG) x4: LIMA to LAD, SVG to Diagonal 1, SVG to Diagonal 2, Svg to OM 1. (N/A) INTRAOPERATIVE TRANSESOPHAGEAL ECHOCARDIOGRAM (N/A)  Patient Location: PACU and SICU  Anesthesia Type:General  Level of Consciousness: sedated, unresponsive and Patient remains intubated per anesthesia plan  Airway & Oxygen Therapy: Patient remains intubated per anesthesia plan and Patient placed on Ventilator (see vital sign flow sheet for setting)  Post-op Assessment: Report given to PACU RN and Post -op Vital signs reviewed and stable  Post vital signs: Reviewed and stable  Complications: No apparent anesthesia complications

## 2013-09-24 NOTE — Anesthesia Preprocedure Evaluation (Addendum)
Anesthesia Evaluation  Patient identified by MRN, date of birth, ID band Patient awake    Reviewed: Allergy & Precautions, H&P , NPO status , Patient's Chart, lab work & pertinent test results  History of Anesthesia Complications Negative for: history of anesthetic complications  Airway Mallampati: II TM Distance: >3 FB Neck ROM: Full    Dental  (+) Teeth Intact, Dental Advisory Given   Pulmonary neg pulmonary ROS,  breath sounds clear to auscultation  Pulmonary exam normal       Cardiovascular Exercise Tolerance: Poor + angina with exertion + CAD (severe 3v ASCADz) + dysrhythmias Atrial Fibrillation + Valvular Problems/Murmurs (severe AS, AVA 0.7cm2) AS Rhythm:Regular Rate:Normal  5/15 ECHO: EF 6-65%, mild LVH with grade 1 diastolic dysfunction, mod-severe AS with mild AI   Neuro/Psych negative neurological ROS     GI/Hepatic negative GI ROS, Neg liver ROS,   Endo/Other  Hypothyroidism   Renal/GU negative Renal ROS     Musculoskeletal   Abdominal   Peds  Hematology negative hematology ROS (+)   Anesthesia Other Findings   Reproductive/Obstetrics                          Anesthesia Physical Anesthesia Plan  ASA: III  Anesthesia Plan: General   Post-op Pain Management:    Induction: Intravenous  Airway Management Planned: Oral ETT  Additional Equipment: Arterial line, PA Cath, 3D TEE and Ultrasound Guidance Line Placement  Intra-op Plan:   Post-operative Plan: Post-operative intubation/ventilation  Informed Consent: I have reviewed the patients History and Physical, chart, labs and discussed the procedure including the risks, benefits and alternatives for the proposed anesthesia with the patient or authorized representative who has indicated his/her understanding and acceptance.   Dental advisory given  Plan Discussed with: CRNA and Surgeon  Anesthesia Plan Comments: (Plan  routine monitors, A line, PA cath, GETA with TEE and post op ventilation)        Anesthesia Quick Evaluation

## 2013-09-24 NOTE — Brief Op Note (Addendum)
09/24/2013  12:02 PM  PATIENT:  Linda Parker  78 y.o. female  PRE-OPERATIVE DIAGNOSIS:  1. Multivessel CAD 2 Severe AS  POST-OPERATIVE DIAGNOSIS:  1. Multivessel CAD 2 Severe AS  PROCEDURE: INTRAOPERATIVE TRANSESOPHAGEAL ECHOCARDIOGRAM,  CORONARY ARTERY BYPASS GRAFTING (CABG) x 4 (LIMA to LAD, SVG to DIAGONAL 1, SVG to DIAGONAL 2, and SVG to OM1) with EVH from the right thigh and partial right lower leg,  AORTIC VALVE REPLACEMENT (AVR) (using a Pericardial Tissue Valve, size 23 mm)  SURGEON:  Surgeon(s) and Role:    * Gaye Pollack, MD - Primary  PHYSICIAN ASSISTANT: Lars Pinks PA-C  ANESTHESIA:   general  EBL:  Total I/O In: -  Out: 400 [Urine:400]   DRAINS: Chest tubes in the mediastinal and pleural spaces   SPECIMEN:  Source of Specimen:  Native AV leaflets  DISPOSITION OF SPECIMEN:  PATHOLOGY  COUNTS CORRECT:  YES  DICTATION: .Dragon Dictation  PLAN OF CARE: Admit to inpatient   PATIENT DISPOSITION:  ICU - intubated and hemodynamically stable.   Delay start of Pharmacological VTE agent (>24hrs) due to surgical blood loss or risk of bleeding: yes  BASELINE WEIGHT: 57 kg  Aortic Valve Etiology   Aortic Insufficiency:  Mild  Aortic Valve Disease:  Yes.  Aortic Stenosis:  Yes. Smallest Aortic Valve Area: 0.7cm2; Highest Mean Gradient: 68mmHg.  Etiology (Choose at least one and up to  5 etiologies):  Bicuspid valve disease  Aortic Valve  Procedure Performed:  Replacement: Yes.  Bioprosthetic Valve. Implant Model Number:[id number] [id number], Size:23 mm, Unique Device Identifier:[id number]  Repair/Reconstruction: No.   Aortic Annular Enlargement: No.

## 2013-09-24 NOTE — Op Note (Signed)
CARDIOVASCULAR SURGERY OPERATIVE NOTE  09/24/2013  Surgeon:  Gaye Pollack, MD  First Assistant: Lars Pinks, PA-C   Preoperative Diagnosis:  Severe multi-vessel coronary artery disease and severe aortic stenosis   Postoperative Diagnosis:  Same   Procedure:  1. Median Sternotomy 2. Extracorporeal circulation 3.   Coronary artery bypass grafting x 4   Left internal mammary graft to the LAD  SVG to Diagonal 1  SVG to Diagonal 2   SVG to OM1 4.   Endoscopic vein harvest from the right leg 5.   Aortic valve replacement with a 23 mm Edwards Magna-Ease pericardial valve.   Anesthesia:  General Endotracheal   Clinical History/Surgical Indication:  The patient is a 78 year old woman with a history of aortic stenosis, hypothyroidism on replacement therapy, and palpitations. Her most recent echo on 09/02/2013 showed severe aortic stenosis with an AVA of 0.7 cm2 with a mean gradient of 49 mm Hg. LVEF was 60-65%. The mean gradient 1 year ago was 37. For the past couple months she has noted some pedal edema bilaterally when she is up during the day. She also had an episode of chest tightness while running to the bathroom in her house. She tells me that she is having some chest discomfort in the office today talking about surgery. She has severe symptomatic aortic stenosis and significant 2-vessel coronary disease with high grade LAD and diagonal stenoses and moderate LCX stenosis. The RCA has mild, non-obstructive disease. I agree that CABG and AVR is the best treatment for her to prevent further ischemia and infarction and progression of congestive heart failure symptoms. I discussed the pros and cons of mechanical and tissue valves and my recommendation for a tissue valve given her age of 78. She is in agreement. I discussed the operative procedure with the patient and family including  alternatives, benefits and risks; including but not limited to bleeding, blood transfusion, infection, stroke, myocardial infarction, graft failure, heart block requiring a permanent pacemaker, organ dysfunction, and death. Era Bumpers understands and agrees to proceed.     Preparation:  The patient was seen in the preoperative holding area and the correct patient, correct operation were confirmed with the patient after reviewing the medical record and catheterization. The consent was signed by me. Preoperative antibiotics were given. A pulmonary arterial line and radial arterial line were placed by the anesthesia team. The patient was taken back to the operating room and positioned supine on the operating room table. After being placed under general endotracheal anesthesia by the anesthesia team a foley catheter was placed. The neck, chest, abdomen, and both legs were prepped with betadine soap and solution and draped in the usual sterile manner. A surgical time-out was taken and the correct patient and operative procedure were confirmed with the nursing and anesthesia staff.  TEE:  Performed by Dr. Annye Asa. It shows a bicuspid aortic valve with severe aortic stenosis and an AVA of 0.5 cm2. There is mild concentric LVH and normal systolic function. There is trivial MR.   Cardiopulmonary Bypass:  A median sternotomy was performed. The pericardium was opened in the midline. Right ventricular function appeared normal. The ascending aorta was of normal size and had no palpable plaque. There were no contraindications to aortic cannulation or cross-clamping. The patient was fully systemically heparinized and the ACT was maintained > 400 sec. The proximal aortic arch was cannulated with a 20 F aortic cannula for arterial inflow. Venous cannulation was performed via the right  atrial appendage using a two-staged venous cannula. An antegrade cardioplegia/vent cannula was inserted into the  mid-ascending aorta. A retrograde cardioplegia cannula was placed via the right atrium and a left ventricular vent via the right superior pulmonary vein. Aortic occlusion was performed with a single cross-clamp. Systemic cooling to 32 degrees Centigrade and topical cooling of the heart with iced saline were used. Hyperkalemic antegrade cold blood cardioplegia was used to induce diastolic arrest and was then given at about 20 minute intervals throughout the period of arrest to maintain myocardial temperature at or below 10 degrees centigrade. A temperature probe was inserted into the interventricular septum and an insulating pad was placed in the pericardium.   Left internal mammary harvest:  The left side of the sternum was retracted using the Rultract retractor. The left internal mammary artery was harvested as a pedicle graft. All side branches were clipped. It was a medium-sized vessel of good quality with excellent blood flow. It was ligated distally and divided. It was sprayed with topical papaverine solution to prevent vasospasm.   Endoscopic vein harvest:  The right greater saphenous vein was harvested endoscopically through a 2 cm incision medial to the right knee. It was harvested from the upper thigh to below the knee. It was a medium-sized vein of good quality. The side branches were all ligated with 4-0 silk ties.    Coronary arteries:  The coronary arteries were examined.  LAD:  Diffusely diseased. It was a moderate sized vessel in the midportion and then became small and did not reach the apex . The D1 was a moderate sized vessel that was diffusely diseased. The D2 was small but graftable.  LCX:  The first OM was a large vessel. The second OM was small where it could be visualized. There was only 50% ostial stenosis and I was concerned about the graft staying open under these circumstances. I thought it would be best to only graft the OM1.  RCA:  Diffusely diseased but not  significant stenosis by cath.   Grafts:  1. LIMA to the LAD: 1.75 mm. It was sewn end to side using 8-0 prolene continuous suture. 2. SVG to D1:  1.75 mm. It was sewn end to side using 7-0 prolene continuous suture. 3. SVG to D2:  1.5 mm. It was sewn end to side using 7-0 prolene continuous suture. 4. SVG to OM1:  1.75 mm. It was sewn end to side using 7-0 prolene continuous suture.   Aortic Valve Replacement:  A transverse aortotomy was performed 1 cm above the take-off of the right coronary artery. The native valve was bicuspid with calcified leaflets and mild annular calcification. The ostia of the left coronary artery was in normal position. The ostia of the RCA and the LCX both came off the right sinus of valsalva. The native valve leaflets were excised and the annulus was decalcified with rongeurs. Care was taken to remove all particulate debris. The left ventricle was directly inspected for debris and then irrigated with ice saline solution. The annulus was sized and a size 21 mm [employer] Ease pericardial valve was chosen. The model number was [id number] and the serial number was [id number]. While the valve was being prepared 2-0 Ethibond pledgeted horizontal mattress sutures were placed around the annulus with the pledgets in a sub-annular position. The sutures were placed through the sewing ring and the valve lowered into place. The sutures were tied sequentially. The valve seated nicely and the coronary ostia were not obstructed.  The prosthetic valve leaflets moved normally and there was no sub-valvular obstruction. The aortotomy was closed using 4-0 Prolene suture in 2 layers with felt strips to reinforce the closure.   The proximal vein graft anastomoses were performed to the mid-ascending aorta using continuous 6-0 prolene suture. Graft markers were placed around the proximal anastomoses.   Completion:  The patient was rewarmed to 37 degrees Centigrade. The clamp was removed from the  LIMA pedicle and there was rapid warming of the septum and return of ventricular fibrillation. The crossclamp was removed with a time of 155 minutes. There was spontaneous return of sinus rhythm. The distal and proximal anastomoses were checked for hemostasis. The position of the grafts was satisfactory. Two temporary epicardial pacing wires were placed on the right atrium and two on the right ventricle. The patient was weaned from CPB without difficulty on no inotropes. CPB time was 186 minutes. Cardiac output was 6 LPM. TEE showed a normally functioning aortic valve prosthesis. There was no perivalvular leak and no regurgitation through the valve. There was trivial MR that was unchanged. LV function was preserved. Heparin was fully reversed with protamine and the aortic and venous cannulas removed. Hemostasis was achieved. Mediastinal and bilateral pleural drainage tubes were placed. The sternum was closed with #6 stainless steel wires. The fascia was closed with continuous # 1 vicryl suture. The subcutaneous tissue was closed with 2-0 vicryl continuous suture. The skin was closed with 3-0 vicryl subcuticular suture. All sponge, needle, and instrument counts were reported correct at the end of the case. Dry sterile dressings were placed over the incisions and around the chest tubes which were connected to pleurevac suction. The patient was then transported to the surgical intensive care unit in critical but stable condition.

## 2013-09-24 NOTE — Procedures (Signed)
Extubation Procedure Note  Patient Details:   Name: Linda Parker DOB: [birth year] MRN: [id number]   Airway Documentation:     Evaluation  O2 sats: stable throughout Complications: No apparent complications Patient did tolerate procedure well. Bilateral Breath Sounds: Clear   Yes  Pt. Was extubated to a 4L [place] without any complications, dyspnea or stridor noted. Pt. Achieved a goal of 1L on VC & -20 on NIF. Pt. Was instructed on IS x 10. Highest goal achieved was 645mL.   Linda Parker 09/24/2013, 08:45 PM

## 2013-09-24 NOTE — Progress Notes (Signed)
RT note- placed back to full support, does not have neck control, will continue to monitor for wean.

## 2013-09-24 NOTE — Anesthesia Procedure Notes (Signed)
Anesthesia Procedure Note PA catheter:  Routine monitors. Timeout, sterile prep, drape, FBP R neck.  Trendelenburg position.  1% [place] local, finder and trocar RIJ 1st pass with US guidance.  Cordis placed over J wire. PA catheter in easily.  Sterile dressing applied.  Patient tolerated well, VSS.  C Jackson, MD  07:17-07:28   

## 2013-09-24 NOTE — Progress Notes (Signed)
Patient ID: Era Bumpers, female   DOB: 10/21/1935, 78 y.o.   MRN: [id number]   SICU Evening Rounds:   Hemodynamically stable on low dose neo CI = 1.6  Has started to wake up on vent.  Urine output good  CT output low  CBC    Component Value Date/Time   WBC 16.1* 09/24/2013 1420   RBC 3.49* 09/24/2013 1420   HGB 11.6* 09/24/2013 1429   HCT 34.0* 09/24/2013 1429   PLT 118* 09/24/2013 1420   MCV 93.1 09/24/2013 1420   MCH 31.8 09/24/2013 1420   MCHC 34.2 09/24/2013 1420   RDW 15.0 09/24/2013 1420   LYMPHSABS 2.0 09/09/2013 0925   MONOABS 0.8 09/09/2013 0925   EOSABS 0.6 09/09/2013 0925   BASOSABS 0.0 09/09/2013 0925     BMET    Component Value Date/Time   NA 139 09/24/2013 1429   K 3.4* 09/24/2013 1429   CL 107 09/20/2013 1557   CO2 18* 09/20/2013 1557   GLUCOSE 93 09/24/2013 1429   BUN 18 09/20/2013 1557   CREATININE 0.60 09/20/2013 1557   CALCIUM 9.5 09/20/2013 1557   GFRNONAA 87* 09/20/2013 1557   GFRAA >90 09/20/2013 1557     A/P:  Stable postop course. Continue current plans

## 2013-09-24 NOTE — Progress Notes (Signed)
Utilization Review Completed.Linda Parker T Dowell6/05/2013  

## 2013-09-24 NOTE — Progress Notes (Signed)
  Echocardiogram Echocardiogram Transesophageal (OR) has been performed.  Linda Parker 09/24/2013, 8:43 AM

## 2013-09-24 NOTE — Interval H&P Note (Signed)
History and Physical Interval Note:  09/24/2013 7:26 AM  Linda Parker  has presented today for surgery, with the diagnosis of CAD, severe AS  The various methods of treatment have been discussed with the patient and family. After consideration of risks, benefits and other options for treatment, the patient has consented to  Procedure(s): AORTIC VALVE REPLACEMENT (AVR) (N/A) CORONARY ARTERY BYPASS GRAFTING (CABG) (N/A) INTRAOPERATIVE TRANSESOPHAGEAL ECHOCARDIOGRAM (N/A) as a surgical intervention .  The patient's history has been reviewed, patient examined, no change in status, stable for surgery.  I have reviewed the patient's chart and labs.  Questions were answered to the patient's satisfaction.     Gaye Pollack

## 2013-09-25 ENCOUNTER — Inpatient Hospital Stay (HOSPITAL_COMMUNITY): Payer: Medicare Other

## 2013-09-25 LAB — GLUCOSE, CAPILLARY
GLUCOSE-CAPILLARY: 124 mg/dL — AB (ref 70–99)
GLUCOSE-CAPILLARY: 121 mg/dL — AB (ref 70–99)
GLUCOSE-CAPILLARY: 127 mg/dL — AB (ref 70–99)
GLUCOSE-CAPILLARY: 151 mg/dL — AB (ref 70–99)
Glucose-Capillary: 115 mg/dL — ABNORMAL HIGH (ref 70–99)

## 2013-09-25 LAB — CBC
HCT: 26.5 % — ABNORMAL LOW (ref 36.0–46.0)
HEMATOCRIT: 27.9 % — AB (ref 36.0–46.0)
Hemoglobin: 9.7 g/dL — ABNORMAL LOW (ref 12.0–15.0)
Hemoglobin: 9.3 g/dL — ABNORMAL LOW (ref 12.0–15.0)
MCH: 32.1 pg (ref 26.0–34.0)
MCH: 32.6 pg (ref 26.0–34.0)
MCHC: 34.8 g/dL (ref 30.0–36.0)
MCHC: 35.1 g/dL (ref 30.0–36.0)
MCV: 92.4 fL (ref 78.0–100.0)
MCV: 93 fL (ref 78.0–100.0)
Platelets: 131 10*3/uL — ABNORMAL LOW (ref 150–400)
Platelets: 104 10*3/uL — ABNORMAL LOW (ref 150–400)
RBC: 3.02 MIL/uL — ABNORMAL LOW (ref 3.87–5.11)
RBC: 2.85 MIL/uL — AB (ref 3.87–5.11)
RDW: 16 % — ABNORMAL HIGH (ref 11.5–15.5)
RDW: 16 % — ABNORMAL HIGH (ref 11.5–15.5)
WBC: 15.4 10*3/uL — AB (ref 4.0–10.5)
WBC: 14.5 10*3/uL — ABNORMAL HIGH (ref 4.0–10.5)

## 2013-09-25 LAB — BASIC METABOLIC PANEL
BUN: 11 mg/dL (ref 6–23)
CHLORIDE: 103 meq/L (ref 96–112)
CO2: 22 meq/L (ref 19–32)
Calcium: 7.3 mg/dL — ABNORMAL LOW (ref 8.4–10.5)
Creatinine, Ser: 0.58 mg/dL (ref 0.50–1.10)
GFR calc Af Amer: >90 mL/min (ref >90)
GFR calc non Af Amer: 88 mL/min — ABNORMAL LOW (ref >90)
Glucose, Bld: 141 mg/dL — ABNORMAL HIGH (ref 70–99)
POTASSIUM: 3.9 meq/L (ref 3.7–5.3)
Sodium: 137 mEq/L (ref 137–147)

## 2013-09-25 LAB — TYPE AND SCREEN
ABO/RH(D): O NEG
Antibody Screen: POSITIVE
DAT, IGG: NEGATIVE
Unit division: 0
Unit division: 0

## 2013-09-25 LAB — POCT I-STAT, CHEM 8
BUN: 15 mg/dL (ref 6–23)
CALCIUM ION: 1.22 mmol/L (ref 1.13–1.30)
CHLORIDE: 97 meq/L (ref 96–112)
Creatinine, Ser: 0.8 mg/dL (ref 0.50–1.10)
Glucose, Bld: 132 mg/dL — ABNORMAL HIGH (ref 70–99)
HEMATOCRIT: 30 % — AB (ref 36.0–46.0)
HEMOGLOBIN: 10.2 g/dL — AB (ref 12.0–15.0)
Potassium: 4.2 mEq/L (ref 3.7–5.3)
Sodium: 139 mEq/L (ref 137–147)
TCO2: 26 mmol/L (ref 0–100)

## 2013-09-25 LAB — CREATININE, SERUM
Creatinine, Ser: 0.69 mg/dL (ref 0.50–1.10)
GFR calc Af Amer: >90 mL/min (ref >90)
GFR, EST NON AFRICAN AMERICAN: 83 mL/min — AB (ref >90)

## 2013-09-25 LAB — MAGNESIUM
MAGNESIUM: 2.7 mg/dL — AB (ref 1.5–2.5)
Magnesium: 2.6 mg/dL — ABNORMAL HIGH (ref 1.5–2.5)

## 2013-09-25 MED ORDER — INSULIN ASPART 100 UNIT/ML ~~LOC~~ SOLN
0.0000 [IU] | SUBCUTANEOUS | Status: DC
  Administered 2013-09-25 (×2): 2 [IU] via SUBCUTANEOUS

## 2013-09-25 MED ORDER — ENOXAPARIN SODIUM 40 MG/0.4ML ~~LOC~~ SOLN
40.0000 mg | Freq: Every day | SUBCUTANEOUS | Status: DC
  Administered 2013-09-25 – 2013-09-27 (×3): 40 mg via SUBCUTANEOUS
  Filled 2013-09-25 (×4): qty 0.4

## 2013-09-25 MED FILL — Magnesium Sulfate Inj 50%: INTRAMUSCULAR | Qty: 10 | Status: AC

## 2013-09-25 MED FILL — Heparin Sodium (Porcine) Inj 1000 Unit/ML: INTRAMUSCULAR | Qty: 30 | Status: AC

## 2013-09-25 MED FILL — Potassium Chloride Inj 2 mEq/ML: INTRAVENOUS | Qty: 40 | Status: AC

## 2013-09-25 MED FILL — Phenylephrine HCl Inj 10 MG/ML: INTRAMUSCULAR | Qty: 2 | Status: AC

## 2013-09-25 MED FILL — Dextrose Inj 5%: INTRAVENOUS | Qty: 250 | Status: AC

## 2013-09-25 NOTE — Progress Notes (Signed)
POD # 1 AVR/CABG  Up in chair, visiting with family  BP 111/53  Pulse 87  Temp(Src) 98 F (36.7 C) (Oral)  Resp 17  Ht 5\' 3"  (1.6 m)  Wt 136 lb 11 oz (62 kg)  BMI 24.22 kg/m2  SpO2 95%   Intake/Output Summary (Last 24 hours) at 09/25/13 1834 Last data filed at 09/25/13 1500  Gross per 24 hour  Intake 1587.5 ml  Output   1220 ml  Net  367.5 ml   K= 4.2, HCT 26  Doing well POD # 1

## 2013-09-25 NOTE — Progress Notes (Addendum)
TCTS DAILY ICU PROGRESS NOTE                   [address]            [place] [address]          [phone]   1 Day Post-Op Procedure(s) (LRB): AORTIC VALVE REPLACEMENT (AVR) using Pericardial Tissue Valve (size 28mm). (N/A) CORONARY ARTERY BYPASS GRAFTING (CABG) x4: LIMA to LAD, SVG to Diagonal 1, SVG to Diagonal 2, Svg to OM 1. (N/A) INTRAOPERATIVE TRANSESOPHAGEAL ECHOCARDIOGRAM (N/A)  Total Length of Stay:  LOS: 1 day   Subjective: Having a lot of pain after getting up to weigh this am.    Objective: Vital signs in last 24 hours: Temp:  [95 F (35 C)-99 F (37.2 C)] 98.8 F (37.1 C) (06/03 0700) Pulse Rate:  [78-91] 80 (06/03 0700) Cardiac Rhythm:  [-] Atrial paced (06/03 0400) Resp:  [12-23] 17 (06/03 0700) BP: (71-127)/(43-78) 89/49 mmHg (06/03 0700) SpO2:  [95 %-100 %] 100 % (06/03 0700) Arterial Line BP: (76-129)/(46-80) 104/50 mmHg (06/03 0700) FiO2 (%):  [36 %-50 %] 36 % (06/02 2045) Weight:  [124 lb 5.4 oz (56.399 kg)-136 lb 11 oz (62 kg)] 136 lb 11 oz (62 kg) (06/03 0542)  Filed Weights   09/24/13 0546 09/24/13 1500 09/25/13 0542  Weight: 124 lb 5.4 oz (56.399 kg) 124 lb 5.4 oz (56.399 kg) 136 lb 11 oz (62 kg)    Weight change: -0 oz (-0.001 kg)   Hemodynamic parameters for last 24 hours: PAP: (3-31)/(0-17) 27/15 mmHg CO:  [2.1 L/min-4 L/min] 3.3 L/min CI:  [1.3 L/min/m2-2.5 L/min/m2] 2.1 L/min/m2  Intake/Output from previous day: 06/02 0701 - 06/03 0700 In: 4923.9 [I.V.:3373.9; Blood:200; IV Piggyback:1350] Out: 0160 [Urine:5500; Blood:400; Chest Tube:605]  Intake/Output this shift:    Current Meds: Scheduled Meds: . acetaminophen  1,000 mg Oral 4 times per day   Or  . acetaminophen (TYLENOL) oral liquid 160 mg/5 mL  1,000 mg Per Tube 4 times per day  . aspirin EC  325 mg Oral Daily   Or  . aspirin  324 mg Per Tube Daily  . bisacodyl  10 mg Oral Daily   Or  . bisacodyl  10 mg Rectal Daily  . cefUROXime (ZINACEF)  IV  1.5  g Intravenous Q12H  . docusate sodium  200 mg Oral Daily  . estradiol  0.5 mg Oral Q breakfast  . famotidine (PEPCID) IV  20 mg Intravenous Q12H  . folic acid  1 mg Oral Daily  . insulin aspart  0-24 Units Subcutaneous 6 times per day  . levothyroxine  50 mcg Oral QAC breakfast  . metoprolol tartrate  12.5 mg Oral BID   Or  . metoprolol tartrate  12.5 mg Per Tube BID  . multivitamin with minerals  1 tablet Oral Daily  . [START ON 09/26/2013] pantoprazole  40 mg Oral Daily  . sodium chloride  3 mL Intravenous Q12H   Continuous Infusions: . sodium chloride 20 mL/hr at 09/25/13 0400  . sodium chloride 20 mL/hr at 09/25/13 0400  . sodium chloride 250 mL (09/25/13 0606)  . dexmedetomidine Stopped (09/24/13 1700)  . lactated ringers 20 mL/hr at 09/25/13 0400  . phenylephrine (NEO-SYNEPHRINE) Adult infusion 10 mcg/min (09/25/13 0500)   PRN Meds:.metoprolol, midazolam, morphine injection, ondansetron (ZOFRAN) IV, oxyCODONE, sodium chloride   Physical Exam: General appearance: alert, cooperative and no distress Heart: regular rate and rhythm Lungs: diminished breath sounds bibasilar Extremities:  No significant LE edema Wound: Dressed and dry  Lab Results: CBC: Recent Labs  09/24/13 1955 09/24/13 1957 09/25/13 0400  WBC 15.2*  --  15.4*  HGB 10.1* 10.9* 9.7*  HCT 29.2* 32.0* 27.9*  PLT 136*  --  131*   BMET:  Recent Labs  09/24/13 1957 09/25/13 0400  NA 137 137  K 4.3 3.9  CL 104 103  CO2  --  22  GLUCOSE 152* 141*  BUN 9 11  CREATININE 0.50 0.58  CALCIUM  --  7.3*    PT/INR:  Recent Labs  09/24/13 1420  LABPROT 17.5*  INR 1.48   Radiology: Dg Chest Portable 1 View In Am  09/25/2013   CLINICAL DATA:  Postop  EXAM: PORTABLE CHEST - 1 VIEW  COMPARISON:  09/24/2013; 09/20/2013  FINDINGS: Grossly unchanged cardiac silhouette and mediastinal contours with atherosclerotic plaque within the thoracic aorta. Post median sternotomy, CABG and aortic valve replacement.  Interval extubation and removal of enteric tube. Otherwise, stable positioning of remaining support apparatus. No pneumothorax. Mild cephalization of flow without frank evidence of edema. Slight worsening of bilateral infrahilar heterogeneous opacities, left greater than right, likely atelectasis. Trace left-sided effusion is not excluded. Grossly unchanged bones. There is mild gaseous distention of the stomach below the left hemidiaphragm.  IMPRESSION: 1. Interval extubation and removal of enteric tube. Otherwise, stable positioning remaining support apparatus. No pneumothorax. 2. Mild pulmonary venous congestion without frank evidence of edema. 3. Worsening bilateral infrahilar opacities, left greater than right, likely atelectasis.   Electronically Signed   By: Sandi Mariscal M.D.   On: 09/25/2013 07:52   Dg Chest Portable 1 View  09/24/2013   CLINICAL DATA:  Status post cardiac surgery  EXAM: PORTABLE CHEST - 1 VIEW  COMPARISON:  09/20/2013  FINDINGS: Postoperative changes consistent with valve repair are now seen. A mediastinal drain, pericardial drain and left thoracostomy catheter are noted. An endotracheal tube is seen 2.6 cm above the carina. A nasogastric catheter is coiled within the stomach. Swan-Ganz catheter is noted within the right pulmonary artery. Lungs are well-aerated without focal new infiltrate or pneumothorax. No other focal abnormality is seen.  IMPRESSION: Postoperative changes as described.  Tubes and lines as described.  No acute abnormality is seen.   Electronically Signed   By: Inez Catalina M.D.   On: 09/24/2013 15:06     Assessment/Plan: S/P Procedure(s) (LRB): AORTIC VALVE REPLACEMENT (AVR) using Pericardial Tissue Valve (size 53mm). (N/A) CORONARY ARTERY BYPASS GRAFTING (CABG) x4: LIMA to LAD, SVG to Diagonal 1, SVG to Diagonal 2, Svg to OM 1. (N/A) INTRAOPERATIVE TRANSESOPHAGEAL ECHOCARDIOGRAM (N/A) Doing well POD#1.   CV- SBPs low overnight requiring Neo.  Wean drips as  able. Pulm toilet, IS. Mobilize, d/c lines and tubes, routine POD 1 progression.  Coolidge Breeze 09/25/2013 7:55 AM   Chart reviewed, patient examined, agree with above. She is doing well. Hold on beta blocker until BP stable off neo.

## 2013-09-26 ENCOUNTER — Inpatient Hospital Stay (HOSPITAL_COMMUNITY): Payer: Medicare Other

## 2013-09-26 LAB — CBC
HCT: 26.3 % — ABNORMAL LOW (ref 36.0–46.0)
Hemoglobin: 9.1 g/dL — ABNORMAL LOW (ref 12.0–15.0)
MCH: 32.4 pg (ref 26.0–34.0)
MCHC: 34.6 g/dL (ref 30.0–36.0)
MCV: 93.6 fL (ref 78.0–100.0)
PLATELETS: 110 10*3/uL — AB (ref 150–400)
RBC: 2.81 MIL/uL — AB (ref 3.87–5.11)
RDW: 16 % — ABNORMAL HIGH (ref 11.5–15.5)
WBC: 13.4 10*3/uL — AB (ref 4.0–10.5)

## 2013-09-26 LAB — GLUCOSE, CAPILLARY
GLUCOSE-CAPILLARY: 105 mg/dL — AB (ref 70–99)
GLUCOSE-CAPILLARY: 124 mg/dL — AB (ref 70–99)
GLUCOSE-CAPILLARY: 97 mg/dL (ref 70–99)
Glucose-Capillary: 101 mg/dL — ABNORMAL HIGH (ref 70–99)
Glucose-Capillary: 124 mg/dL — ABNORMAL HIGH (ref 70–99)
Glucose-Capillary: 110 mg/dL — ABNORMAL HIGH (ref 70–99)

## 2013-09-26 LAB — BASIC METABOLIC PANEL
BUN: 18 mg/dL (ref 6–23)
CO2: 24 mEq/L (ref 19–32)
CREATININE: 0.69 mg/dL (ref 0.50–1.10)
Calcium: 8.3 mg/dL — ABNORMAL LOW (ref 8.4–10.5)
Chloride: 105 mEq/L (ref 96–112)
GFR calc Af Amer: >90 mL/min (ref >90)
GFR calc non Af Amer: 83 mL/min — ABNORMAL LOW (ref >90)
Glucose, Bld: 110 mg/dL — ABNORMAL HIGH (ref 70–99)
Potassium: 4.1 mEq/L (ref 3.7–5.3)
SODIUM: 137 meq/L (ref 137–147)

## 2013-09-26 MED ORDER — MOVING RIGHT ALONG BOOK
Freq: Once | Status: AC
  Administered 2013-09-26: 10:00:00
  Filled 2013-09-26: qty 1

## 2013-09-26 MED ORDER — FUROSEMIDE 40 MG PO TABS
40.0000 mg | ORAL_TABLET | Freq: Every day | ORAL | Status: DC
  Administered 2013-09-27 – 2013-09-28 (×2): 40 mg via ORAL
  Filled 2013-09-26 (×2): qty 1

## 2013-09-26 MED ORDER — MAGNESIUM HYDROXIDE 400 MG/5ML PO SUSP: 30.0000 mL | Freq: Every day | ORAL | Status: DC | PRN

## 2013-09-26 MED ORDER — TRAMADOL HCL 50 MG PO TABS
50.0000 mg | ORAL_TABLET | ORAL | Status: DC | PRN
  Administered 2013-09-26: 50 mg via ORAL
  Administered 2013-09-28: 100 mg via ORAL
  Filled 2013-09-26 (×2): qty 2

## 2013-09-26 MED ORDER — METOPROLOL TARTRATE 12.5 MG HALF TABLET
12.5000 mg | ORAL_TABLET | Freq: Two times a day (BID) | ORAL | Status: DC
  Administered 2013-09-26 – 2013-09-28 (×5): 12.5 mg via ORAL
  Filled 2013-09-26 (×6): qty 1

## 2013-09-26 MED ORDER — ACETAMINOPHEN 325 MG PO TABS
650.0000 mg | ORAL_TABLET | Freq: Four times a day (QID) | ORAL | Status: DC | PRN
Start: 2013-09-26 — End: 2013-09-28
  Administered 2013-09-26 (×2): 1000 mg via ORAL
  Administered 2013-09-27 (×2): 650 mg via ORAL
  Filled 2013-09-26 (×2): qty 2

## 2013-09-26 MED ORDER — POTASSIUM CHLORIDE CRYS ER 20 MEQ PO TBCR
20.0000 meq | EXTENDED_RELEASE_TABLET | Freq: Every day | ORAL | Status: DC
  Administered 2013-09-27 – 2013-09-28 (×2): 20 meq via ORAL
  Filled 2013-09-26 (×2): qty 1

## 2013-09-26 MED ORDER — INSULIN ASPART 100 UNIT/ML ~~LOC~~ SOLN: 0.0000 [IU] | Freq: Three times a day (TID) | SUBCUTANEOUS | Status: DC

## 2013-09-26 MED ORDER — SODIUM CHLORIDE 0.9 % IJ SOLN
3.0000 mL | Freq: Two times a day (BID) | INTRAMUSCULAR | Status: DC
  Administered 2013-09-26 – 2013-09-27 (×3): 3 mL via INTRAVENOUS

## 2013-09-26 MED ORDER — PHENOL 1.4 % MT LIQD
1.0000 | OROMUCOSAL | Status: DC | PRN
  Filled 2013-09-26: qty 177

## 2013-09-26 MED ORDER — SODIUM CHLORIDE 0.9 % IV SOLN: 250.0000 mL | INTRAVENOUS | Status: DC | PRN

## 2013-09-26 MED ORDER — SODIUM CHLORIDE 0.9 % IJ SOLN: 3.0000 mL | INTRAMUSCULAR | Status: DC | PRN

## 2013-09-26 MED ORDER — ALUM & MAG HYDROXIDE-SIMETH 200-200-20 MG/5ML PO SUSP: 15.0000 mL | ORAL | Status: DC | PRN

## 2013-09-26 NOTE — Progress Notes (Signed)
Attempted to call report to 2W 

## 2013-09-26 NOTE — Progress Notes (Signed)
Report to 2 W RN 

## 2013-09-26 NOTE — Progress Notes (Signed)
CARDIAC REHAB PHASE I   PRE:  Rate/Rhythm: 79 SR  BP:  Supine: 124/80  Sitting:   Standing:    SaO2: 96%RA  MODE:  Ambulation: 160 ft   POST:  Rate/Rhythm: 87  BP:  Supine: 124/70  Sitting:   Standing:    SaO2: 97%RA 1430-1453 Pt assisted to [facility] and then to walk. Walked 160 ft on RA with gait belt use and rolling walker and asst x 1. Generalized weakness. Requested back to bed after walk. Encouraged third walk this evening with staff.   Graylon Good, RN BSN  09/26/2013 2:51 PM

## 2013-09-26 NOTE — Progress Notes (Addendum)
[facility] DAILY ICU PROGRESS NOTE                   [address]            [place] [address]          [phone]   2 Days Post-Op Procedure(s) (LRB): AORTIC VALVE REPLACEMENT (AVR) using Pericardial Tissue Valve (size 56mm). (N/A) CORONARY ARTERY BYPASS GRAFTING (CABG) x4: LIMA to LAD, SVG to Diagonal 1, SVG to Diagonal 2, Svg to OM 1. (N/A) INTRAOPERATIVE TRANSESOPHAGEAL ECHOCARDIOGRAM (N/A)  Total Length of Stay:  LOS: 2 days   Subjective: Feels well this am except throat irritation. Walked in hall yesterday, felt a little "wobbly", but overall stable.   Objective: Vital signs in last 24 hours: Temp:  [98 F (36.7 C)-98.9 F (37.2 C)] 98.2 F (36.8 C) (06/04 0730) Pulse Rate:  [76-88] 78 (06/04 0700) Cardiac Rhythm:  [-] Normal sinus rhythm (06/04 0400) Resp:  [12-28] 19 (06/04 0700) BP: (94-120)/(47-66) 120/63 mmHg (06/04 0700) SpO2:  [94 %-100 %] 94 % (06/04 0700) Arterial Line BP: (101-136)/(37-61) 105/37 mmHg (06/03 1600) Weight:  [135 lb 12.9 oz (61.6 kg)] 135 lb 12.9 oz (61.6 kg) (06/04 0619)  Filed Weights   09/24/13 1500 09/25/13 0542 09/26/13 0619  Weight: 124 lb 5.4 oz (56.399 kg) 136 lb 11 oz (62 kg) 135 lb 12.9 oz (61.6 kg)  BASELINE WEIGHT: 57 kg   Weight change: 11 lb 7.5 oz (5.201 kg)   Hemodynamic parameters for last 24 hours: PAP: (29)/(17) 29/17 mmHg  Intake/Output from previous day: 06/03 0701 - 06/04 0700 In: 910 [P.O.:350; I.V.:460; IV Piggyback:100] Out: 765 [Urine:665; Chest Tube:100]  CBGs  127-132-151-97-101-110    Current Meds: Scheduled Meds: . acetaminophen  1,000 mg Oral 4 times per day   Or  . acetaminophen (TYLENOL) oral liquid 160 mg/5 mL  1,000 mg Per Tube 4 times per day  . aspirin EC  325 mg Oral Daily   Or  . aspirin  324 mg Per Tube Daily  . bisacodyl  10 mg Oral Daily   Or  . bisacodyl  10 mg Rectal Daily  . docusate sodium  200 mg Oral Daily  . enoxaparin (LOVENOX) injection  40 mg Subcutaneous QHS   . estradiol  0.5 mg Oral Q breakfast  . folic acid  1 mg Oral Daily  . insulin aspart  0-24 Units Subcutaneous 6 times per day  . levothyroxine  50 mcg Oral QAC breakfast  . multivitamin with minerals  1 tablet Oral Daily  . pantoprazole  40 mg Oral Daily  . sodium chloride  3 mL Intravenous Q12H   Continuous Infusions: . sodium chloride Stopped (09/25/13 0800)  . sodium chloride Stopped (09/25/13 0800)  . sodium chloride 250 mL (09/25/13 0606)  . dexmedetomidine Stopped (09/24/13 1700)  . lactated ringers 20 mL/hr (09/25/13 0800)  . phenylephrine (NEO-SYNEPHRINE) Adult infusion Stopped (09/25/13 0700)   PRN Meds:.midazolam, morphine injection, ondansetron (ZOFRAN) IV, oxyCODONE, sodium chloride   Physical Exam: General appearance: alert, cooperative and no distress Heart: regular rate and rhythm Lungs: diminished breath sounds bibasilar  Extremities: Mild LE edema Wound: Dressed and dry    Lab Results: CBC: Recent Labs  09/25/13 1622 09/25/13 1628 09/26/13 0345  WBC 14.5*  --  13.4*  HGB 9.3* 10.2* 9.1*  HCT 26.5* 30.0* 26.3*  PLT 104*  --  110*   BMET:  Recent Labs  09/25/13 0400  09/25/13 1628 09/26/13 0345  NA 137  --  139 137  K 3.9  --  4.2 4.1  CL 103  --  97 105  CO2 22  --   --  24  GLUCOSE 141*  --  132* 110*  BUN 11  --  15 18  CREATININE 0.58  < > 0.80 0.69  CALCIUM 7.3*  --   --  8.3*  < > = values in this interval not displayed.  PT/INR:  Recent Labs  09/24/13 1420  LABPROT 17.5*  INR 1.48   Radiology: Dg Chest Portable 1 View In Am  09/25/2013   CLINICAL DATA:  Postop  EXAM: PORTABLE CHEST - 1 VIEW  COMPARISON:  09/24/2013; 09/20/2013  FINDINGS: Grossly unchanged cardiac silhouette and mediastinal contours with atherosclerotic plaque within the thoracic aorta. Post median sternotomy, CABG and aortic valve replacement. Interval extubation and removal of enteric tube. Otherwise, stable positioning of remaining support apparatus. No  pneumothorax. Mild cephalization of flow without frank evidence of edema. Slight worsening of bilateral infrahilar heterogeneous opacities, left greater than right, likely atelectasis. Trace left-sided effusion is not excluded. Grossly unchanged bones. There is mild gaseous distention of the stomach below the left hemidiaphragm.  IMPRESSION: 1. Interval extubation and removal of enteric tube. Otherwise, stable positioning remaining support apparatus. No pneumothorax. 2. Mild pulmonary venous congestion without frank evidence of edema. 3. Worsening bilateral infrahilar opacities, left greater than right, likely atelectasis.   Electronically Signed   By: Sandi Mariscal M.D.   On: 09/25/2013 07:52   Dg Chest Portable 1 View  09/24/2013   CLINICAL DATA:  Status post cardiac surgery  EXAM: PORTABLE CHEST - 1 VIEW  COMPARISON:  09/20/2013  FINDINGS: Postoperative changes consistent with valve repair are now seen. A mediastinal drain, pericardial drain and left thoracostomy catheter are noted. An endotracheal tube is seen 2.6 cm above the carina. A nasogastric catheter is coiled within the stomach. Swan-Ganz catheter is noted within the right pulmonary artery. Lungs are well-aerated without focal new infiltrate or pneumothorax. No other focal abnormality is seen.  IMPRESSION: Postoperative changes as described.  Tubes and lines as described.  No acute abnormality is seen.   Electronically Signed   By: Inez Catalina M.D.   On: 09/24/2013 15:06     Assessment/Plan: S/P Procedure(s) (LRB): AORTIC VALVE REPLACEMENT (AVR) using Pericardial Tissue Valve (size 57mm). (N/A) CORONARY ARTERY BYPASS GRAFTING (CABG) x4: LIMA to LAD, SVG to Diagonal 1, SVG to Diagonal 2, Svg to OM 1. (N/A) INTRAOPERATIVE TRANSESOPHAGEAL ECHOCARDIOGRAM (N/A)  CV- Sinus rhythm, SBPs remain low normal.  Off all drips. Will start low dose beta blocker and watch.  CRPI, pulm toilet, d/c Foley.  Elevated CBGs- no h/o DM.  Continue SSI.  A1C=5.4.  Transfer PTCU.  Coolidge Breeze 09/26/2013 7:36 AM   Chart reviewed, patient examined, agree with above.

## 2013-09-26 NOTE — Progress Notes (Signed)
Pt eating breakfast (received late), then will transfer out.

## 2013-09-26 NOTE — Progress Notes (Signed)
Transferred to 2W 36 via WC and monitor, placed in bed, SCD's with pt, staff to receive in room

## 2013-09-27 ENCOUNTER — Encounter (HOSPITAL_COMMUNITY): Payer: Self-pay | Admitting: Surgery

## 2013-09-27 LAB — CBC
HEMATOCRIT: 26.5 % — AB (ref 36.0–46.0)
HEMOGLOBIN: 9.1 g/dL — AB (ref 12.0–15.0)
MCH: 32.3 pg (ref 26.0–34.0)
MCHC: 34.3 g/dL (ref 30.0–36.0)
MCV: 94 fL (ref 78.0–100.0)
Platelets: 120 10*3/uL — ABNORMAL LOW (ref 150–400)
RBC: 2.82 MIL/uL — ABNORMAL LOW (ref 3.87–5.11)
RDW: 16 % — ABNORMAL HIGH (ref 11.5–15.5)
WBC: 11.9 10*3/uL — AB (ref 4.0–10.5)

## 2013-09-27 LAB — GLUCOSE, CAPILLARY: Glucose-Capillary: 97 mg/dL (ref 70–99)

## 2013-09-27 LAB — BASIC METABOLIC PANEL
BUN: 20 mg/dL (ref 6–23)
CHLORIDE: 104 meq/L (ref 96–112)
CO2: 25 meq/L (ref 19–32)
Calcium: 8.6 mg/dL (ref 8.4–10.5)
Creatinine, Ser: 0.6 mg/dL (ref 0.50–1.10)
GFR calc Af Amer: >90 mL/min (ref >90)
GFR calc non Af Amer: 87 mL/min — ABNORMAL LOW (ref >90)
GLUCOSE: 95 mg/dL (ref 70–99)
POTASSIUM: 4.2 meq/L (ref 3.7–5.3)
Sodium: 137 mEq/L (ref 137–147)

## 2013-09-27 MED ORDER — SIMVASTATIN 20 MG PO TABS
20.0000 mg | ORAL_TABLET | Freq: Every day | ORAL | Status: DC
  Administered 2013-09-27: 20 mg via ORAL
  Filled 2013-09-27 (×2): qty 1

## 2013-09-27 NOTE — Progress Notes (Addendum)
      [address]       [place] [address]             [phone]        3 Days Post-Op Procedure(s) (LRB): AORTIC VALVE REPLACEMENT (AVR) using Pericardial Tissue Valve (size 34mm). (N/A) CORONARY ARTERY BYPASS GRAFTING (CABG) x4: LIMA to LAD, SVG to Diagonal 1, SVG to Diagonal 2, Svg to OM 1. (N/A) INTRAOPERATIVE TRANSESOPHAGEAL ECHOCARDIOGRAM (N/A)  Subjective: Patient walking in the halls this am. Only complaint is sore throat.  Objective: Vital signs in last 24 hours: Temp:  [97.8 F (36.6 C)-98.3 F (36.8 C)] 97.8 F (36.6 C) (06/05 0449) Pulse Rate:  [74-88] 81 (06/05 0449) Cardiac Rhythm:  [-] Normal sinus rhythm (06/04 2045) Resp:  [16-18] 16 (06/05 0449) BP: (104-119)/(61-93) 117/68 mmHg (06/05 0449) SpO2:  [94 %-99 %] 96 % (06/05 0449) Weight:  [133 lb 9.6 oz (60.6 kg)] 133 lb 9.6 oz (60.6 kg) (06/05 0500)  Pre op weight 57 kg Current Weight  09/27/13 133 lb 9.6 oz (60.6 kg)      Intake/Output from previous day: 06/04 0701 - 06/05 0700 In: 620 [P.O.:600; I.V.:20] Out: 865 [Urine:865]   Physical Exam:  Cardiovascular: RRR, no murmurs, gallops, or rubs. Pulmonary: Slightly diminished at bases bilaterally; no rales, wheezes, or rhonchi. Abdomen: Soft, non tender, bowel sounds present. Extremities: Mild bilateral lower extremity edema. Ecchymosis right thigh and LE Wounds: Clean and dry.  No erythema or signs of infection.  Lab Results: CBC: Recent Labs  09/26/13 0345 09/27/13 0413  WBC 13.4* 11.9*  HGB 9.1* 9.1*  HCT 26.3* 26.5*  PLT 110* 120*   BMET:  Recent Labs  09/26/13 0345 09/27/13 0413  NA 137 137  K 4.1 4.2  CL 105 104  CO2 24 25  GLUCOSE 110* 95  BUN 18 20  CREATININE 0.69 0.60  CALCIUM 8.3* 8.6    PT/INR:  Lab Results  Component Value Date   INR 1.48 09/24/2013   INR 0.95 09/20/2013   INR 0.9 09/09/2013   ABG:  INR: Will add last result for INR, ABG once components are confirmed Will add last 4 CBG  results once components are confirmed  Assessment/Plan:  1. CV - SR in the 90's. On Lopressor 12.5 bid. 2.  Pulmonary - Encourage incentive spirometer 3. Volume Overload - On Lasix 40 daily 4.  Acute blood loss anemia - H and H stable at 9.1 and 26.5. Continue folic acid. 5. CBGs 124/110/97. Pre op HGA1C 5.4. Stop accu checks and SS. 6. Remove EPW in am 7. Chest tube sutures to remain. Will be removed in office 8. Not on statin so will start Zocor 20 at hs 9. Chloraseptic spray PRN sore throat 10. Thrombocytopenia-platelets up to 120,000 11. Possible discharge 1-2 days  Jameison Haji M ZimmermanPA-C 09/27/2013,8:03 AM

## 2013-09-27 NOTE — Progress Notes (Signed)
CARDIAC REHAB PHASE I   PRE:  Rate/Rhythm: 92 SR  BP:  Supine: 110/70  Sitting:   Standing:    SaO2: 94 RA  MODE:  Ambulation: 250 ft   POST:  Rate/Rhythm: 102  BP:  Supine:   Sitting: To BR after walk  Standing:    SaO2: 95 RA 1450-1525 On arrival pt in bed with family present. Pt reluctant to walk due to her grandson just getting here. I explained that she needed to walk. Assisted X 1 and used walker to ambulate. Gait steady with walker. Pt able to walk 250 feet, then ask to go back to room due to stomach cramping. Pt to bathroom, sister in room and states that she will get her out of bathroom. We discussed Outpt. CRP and I left her information. Pt wants her husband her for her discharge education. We will complete this in am with them. Rodney Langton RN 09/27/2013 3:23 PM

## 2013-09-27 NOTE — Anesthesia Postprocedure Evaluation (Signed)
  Anesthesia Post-op Note  Patient: Linda Parker  Procedure(s) Performed: Procedure(s): AORTIC VALVE REPLACEMENT (AVR) using Pericardial Tissue Valve (size 40mm). (N/A) CORONARY ARTERY BYPASS GRAFTING (CABG) x4: LIMA to LAD, SVG to Diagonal 1, SVG to Diagonal 2, Svg to OM 1. (N/A) INTRAOPERATIVE TRANSESOPHAGEAL ECHOCARDIOGRAM (N/A)  Patient Location: PACU and Nursing Unit  Anesthesia Type:General  Level of Consciousness: awake, alert , oriented and patient cooperative  Airway and Oxygen Therapy: Patient Spontanous Breathing  Post-op Pain: none  Post-op Assessment: Post-op Vital signs reviewed, Patient's Cardiovascular Status Stable, Respiratory Function Stable, Patent Airway, No signs of Nausea or vomiting, Adequate PO intake and Pain level controlled  Post-op Vital Signs: Reviewed and stable  Last Vitals:  Filed Vitals:   09/27/13 0449  BP: 117/68  Pulse: 81  Temp: 36.6 C  Resp: 16    Complications: No apparent anesthesia complications

## 2013-09-27 NOTE — Discharge Summary (Signed)
Physician Discharge Summary       [address]       [place] [address]             [phone]    Patient ID: Linda Parker MRN: [id number] DOB/AGE: 76-Oct-1937 76 y.o.  Admit date: 09/24/2013 Discharge date: 09/28/2013  Admission Diagnoses: 1. Multivessel CAD 2. Severe aortic stenosis  Discharge Diagnoses:  1. Multivessel CAD 2. Severe aortic stenosis 3. ABL anemia 4.Thrombocytopenia   Procedure (s):  1. Median Sternotomy 2. Extracorporeal circulation 3. Coronary artery bypass grafting x 4  Left internal mammary graft to the LAD  SVG to Diagonal 1  SVG to Diagonal 2  SVG to OM1 4. Endoscopic vein harvest from the right leg  5. Aortic valve replacement with a 23 mm Edwards Magna-Ease pericardial valve by Dr. Cyndia Bent on 09/24/2013.   History of Presenting Illness: The patient is a 78 year old woman with a history of aortic stenosis, hypothyroidism on replacement therapy, and palpitations. Her most recent echo on 09/02/2013 showed severe aortic stenosis with an AVA of 0.7 cm2 with a mean gradient of 49 mm Hg. LVEF was 60-65%. The mean gradient 1 year ago was 37. For the past couple months she has noted some pedal edema bilaterally when she is up during the day. She also had an episode of chest tightness while running to the bathroom in her house. She tells me that she is having some chest discomfort in the office today talking about surgery. Dr. Cyndia Bent discussed the need for coronary artery bypass grafting surgery and aortic valve replacement surgery. Potential risks, benefits, and complications of the surgery were discussed with the patient and she agreed to proceed. Pre operative carotid duplex US showed no significant carotid artery stenosis bilaterally. She underwent a CABG x 4 and AVR on 09/24/2013.   Brief Hospital Course:  The patient was extubated later the evening of surgery without difficulty. She remained afebrile and hemodynamically stable.  She was weaned  off Neo synephrine drip. Gordy Councilman, a line, chest tubes, and foley were removed early in the post operative course. Lopressor was started and titrated accordingly. She was volume over loaded and diuresed. She was weaned off the insulin drip. Glucose remained well controlled. The patient's HGA1C pre op was 5.4. Accu checks and sliding scale were discontinued on 6/52015.The patient was felt surgically stable for transfer from the ICU to PCTU for further convalescence on 09/26/2012.  She continues to progress with cardiac rehab. She was ambulating on room air. She has been tolerating a diet and has had a bowel movement. Epicardial pacing wires will be removed prior to discharge. The patient has ben evaluated and is surgically stable for discharge on 09/28/2012.   Latest Vital Signs: Blood pressure 117/68, pulse 81, temperature 97.8 F (36.6 C), temperature source Oral, resp. rate 16, height 5\' 3"  (1.6 m), weight 133 lb 9.6 oz (60.6 kg), SpO2 96.00%.  Physical Exam: Cardiovascular: RRR, no murmurs, gallops, or rubs.  Pulmonary: Slightly diminished at bases bilaterally; no rales, wheezes, or rhonchi.  Abdomen: Soft, non tender, bowel sounds present.  Extremities: Mild bilateral lower extremity edema. Ecchymosis right thigh and LE  Wounds: Clean and dry. No erythema or signs of infection.   Discharge Condition:Stable  Recent laboratory studies:  Lab Results  Component Value Date   WBC 11.9* 09/27/2013   HGB 9.1* 09/27/2013   HCT 26.5* 09/27/2013   MCV 94.0 09/27/2013   PLT 120* 09/27/2013   Lab  Results  Component Value Date   NA 137 09/27/2013   K 4.2 09/27/2013   CL 104 09/27/2013   CO2 25 09/27/2013   CREATININE 0.60 09/27/2013   GLUCOSE 95 09/27/2013      Diagnostic Studies:    Dg Chest Port 1 View  09/26/2013   CLINICAL DATA:  Post cardiac surgery.  EXAM: PORTABLE CHEST - 1 VIEW  COMPARISON:  09/25/2013  FINDINGS: The Swan-Ganz catheter and chest drains have been removed. The right jugular central  line is still present. No evidence for a large pneumothorax. Heart size is upper limits of normal. Increased densities at both lung bases are suggestive for atelectasis and cannot exclude small pleural effusions. Patient has a prosthetic aortic valve.  IMPRESSION: Removal of chest tubes without a pneumothorax.  Increased densities at both lung bases are suggestive for atelectasis. Cannot exclude small effusions.   Electronically Signed   By: Markus Daft M.D.   On: 09/26/2013 07:54    Discharge Medications:   Medication List    STOP taking these medications       ibuprofen 200 MG tablet  Commonly known as:  ADVIL,MOTRIN     MSM 1000 MG Tabs     Turmeric 500 MG Caps      TAKE these medications       ALPRAZolam 0.25 MG tablet  Commonly known as:  XANAX  Take 1 tablet (0.25 mg total) by mouth at bedtime as needed for anxiety. Take at bedtime before the night before surgery OR the morning of surgery for anxiety     aspirin 325 MG EC tablet  Take 1 tablet (325 mg total) by mouth daily.     CALCIUM MAGNESIUM PO  Take 1 tablet by mouth 2 (two) times daily.     ESTRACE 0.5 MG tablet  Generic drug:  estradiol  Take 0.5 mg by mouth daily with breakfast.     folic acid 673 MCG tablet  Commonly known as:  FOLVITE  Take 400 mcg by mouth daily.     GLUCOSAMINE CHONDR COMPLEX PO  Take 1 tablet by mouth 2 (two) times daily.     levothyroxine 50 MCG tablet  Commonly known as:  SYNTHROID, LEVOTHROID  Take 50 mcg by mouth daily before breakfast.     metoprolol tartrate 12.5 mg Tabs tablet  Commonly known as:  LOPRESSOR  Take 0.5 tablets (12.5 mg total) by mouth 2 (two) times daily.     multivitamin with minerals Tabs tablet  Take 1 tablet by mouth daily.     ondansetron 4 MG tablet  Commonly known as:  ZOFRAN  Take 4 mg by mouth every 6 (six) hours as needed for nausea or vomiting.     simvastatin 20 MG tablet  Commonly known as:  ZOCOR  Take 1 tablet (20 mg total) by mouth daily  at 6 PM.     traMADol 50 MG tablet  Commonly known as:  ULTRAM  Take 1-2 tablets (50-100 mg total) by mouth every 4 (four) hours as needed for moderate pain.     vitamin E 400 UNIT capsule  Take 400 Units by mouth daily.         The patient has been discharged on:   1.Beta Blocker:  Yes [  x ]                              No   [   ]  If No, reason:  2.Ace Inhibitor/ARB: Yes [   ]                                     No  [  x  ]                                     If No, reason: Labile BP  3.Statin:   Yes [x   ]                  No  [   ]                  If No, reason:  4.Ecasa:  Yes  [  x ]                  No   [   ]                  If No, reason:    Follow Up Appointments: Follow-up Information   Follow up with Richardson Dopp, PA-C On 10/23/2013. (Appointment time is at 2:20 pm)    Specialty:  Physician Assistant   Contact information:   [address] [place] [address] [phone]       Follow up with Gaye Pollack, MD On 10/03/2013. (Appointment is with nurse to have chest tube sutures removed. Appointment is at 11:00 am)    Specialty:  Cardiothoracic Surgery   Contact information:   [place] [address] [phone]       Follow up with Gaye Pollack, MD On 10/30/2013. (PA/LAT CXR to be taken (at [facility] which is in the same building as Dr. Vivi Martens office) on 10/30/2013 at 12:00 pm;Appointment with Dr. Cyndia Bent is at 1:00 pm)    Specialty:  Cardiothoracic Surgery   Contact information:   [place] [address] [phone]       Signed: Sharalyn Ink [facility] 09/27/2013, 11:43 AM

## 2013-09-28 LAB — TYPE AND SCREEN
ABO/RH(D): O NEG
Antibody Screen: NEGATIVE
UNIT DIVISION: 0
Unit division: 0
Unit division: 0
Unit division: 0

## 2013-09-28 MED ORDER — SIMVASTATIN 20 MG PO TABS: 20.0000 mg | ORAL_TABLET | Freq: Every day | ORAL | Status: DC

## 2013-09-28 MED ORDER — METOPROLOL TARTRATE 12.5 MG HALF TABLET: 12.5000 mg | ORAL_TABLET | Freq: Two times a day (BID) | ORAL | Status: DC

## 2013-09-28 MED ORDER — TRAMADOL HCL 50 MG PO TABS: 50.0000 mg | ORAL_TABLET | ORAL | Status: DC | PRN

## 2013-09-28 MED ORDER — ASPIRIN 325 MG PO TBEC: 325.0000 mg | DELAYED_RELEASE_TABLET | Freq: Every day | ORAL | Status: DC

## 2013-09-28 NOTE — Discharge Instructions (Signed)
Activity: 1.May walk up steps                2.No lifting more than ten pounds for four weeks.                 3.No driving for four weeks.                4.Stop any activity that causes chest pain, shortness of breath, dizziness, sweating or excessive weakness.                5.Avoid straining.                6.Continue with your breathing exercises daily.  Diet: Low fat, Low saltdiet  Wound Care: May shower.  Clean wounds with mild soap and water daily. Contact the office at [phone] if any problems arise.  Coronary Artery Bypass/Aortic Valve Replacement Care After Refer to this sheet in the next few weeks. These instructions provide you with information on caring for yourself after your procedure. Your caregiver may also give you specific instructions. Your treatment has been planned according to current medical practices, but problems sometimes occur. Call your caregiver if you have any problems or questions after your procedure. HOME CARE INSTRUCTIONS   Only take over-the-counter or prescription medicines as directed by your caregiver.  Take your temperature every morning for the first 7 days after surgery. Write these down. Call your caregiver if your temperature stays above 100 F (37.8 C) for more than a day.   Weigh yourself every morning for at least 7 days after surgery. Write your weight down to monitor any weight increase.  Take frequent naps or rest often throughout the day.  Avoid lifting over 10 lbs (4.5 kg) or pushing or pulling things with your arms for 6 8 weeks or as directed by your caregiver.  Avoid driving or airplane travel for 4 6 weeks after surgery or as directed. If you are riding in a car for an extended period, stop every 1 2 hours to stretch your legs. Keep a record of your medicines and medical history with you when traveling.  Do not cross your legs.  Do not take baths for 4 6 weeks after surgery. Take showers once your caregiver approves. Pat  incisions dry. Do not rub incisions with a washcloth or towel.  Avoid climbing stairs and using the handrail to pull yourself up for the first 2 3 weeks after surgery.  Return to work as directed by your caregiver.  Drink enough fluids to keep your urine clear or pale yellow.  Do not strain to have a bowel movement. Eat high-fiber foods if you become constipated. You may also take a medicine to help you have a bowel movement (laxative) as directed by your caregiver.  Resume sexual activity as directed by your caregiver. Men should not use medicines for erectile dysfunction until their doctor says it isokay.  If you had a certain type of heart condition in the past, you may need to take antibiotic medicine before having dental work or surgery. Let your dentist and caregivers know if you had one or more of the following:  Previous endocarditis.  An artificial (prosthetic) heart valve.  Congenital heart disease. SEEK MEDICAL CARE IF:  You develop a skin rash.   Your weight is increasing each day over 2 3 days, or you have a sudden weight gain.  Your weight increases by 2 or more pounds (1 kg or more) in a  single day. SEEK IMMEDIATE MEDICAL CARE IF:   You develop chest pain that is not coming from your incision.   You develop shortness of breath or have difficulty breathing.   You have a fever.   You have increased bleeding from your wounds.   You have increasing wound pain.   You have redness or swelling around your wounds  You have pus coming from your wound.   You develop lightheadedness.  MAKE SURE YOU:   Understand these directions.  Will watch your condition.  Will get help right away if you are not doing well or get worse. Document Released: 10/28/2004 Document Revised: 03/28/2012 Document Reviewed: 01/24/2012 [facility] Patient Information 2014 [place], [place].  Coronary Artery Bypass Grafting, Care After Refer to this sheet in the next few weeks.  These instructions provide you with information on caring for yourself after your procedure. Your health care provider may also give you more specific instructions. Your treatment has been planned according to current medical practices, but problems sometimes occur. Call your health care provider if you have any problems or questions after your procedure. WHAT TO EXPECT AFTER THE PROCEDURE Recovery from surgery will be different for everyone. Some people feel well after 3 or 4 weeks, while for others it takes longer. After your procedure, it is typical to have the following:  Nausea and a lack of appetite.   Constipation.  Weakness and fatigue.   Depression or irritability.   Pain or discomfort at your incision site. HOME CARE INSTRUCTIONS  Only take over-the-counter or prescription medicines as directed by your health care provider. Take all medicines exactly as directed. Do not stop taking medicines or start any new medicines without first checking with your health care provider.   Take your pulse as directed by your health care provider.  Perform deep breathing as directed by your health care provider. If you were given a device called an incentive spirometer, use it to practice deep breathing several times a day. Support your chest with a pillow or your arms when you take deep breaths or cough.  Keep incision areas clean, dry, and protected. Remove or change any bandages (dressings) only as directed by your health care provider. You may have skin adhesive strips over the incision areas. Do not take the strips off. They will fall off on their own.  Check incision areas daily for any swelling, redness, or drainage.  If incisions were made in your legs, do the following:  Avoid crossing your legs.   Avoid sitting for long periods of time. Change positions every 30 minutes.   Elevate your legs when you are sitting.   Take showers only, no tub baths. Pat incisions dry. Do not  rub incisions with a washcloth or towel. Do not take tub baths or go swimming until your health care provider approves.  Eat foods that are high in fiber, such as raw fruits and vegetables, whole grains, beans, and nuts. Meats should be lean cut. Avoid canned, processed, and fried foods.  Drink enough fluids to keep your urine clear or pale yellow.  Weigh yourself every day. This helps identify if you are retaining fluid that may make your heart and lungs work harder.   Rest and limit activity as directed by your health care provider. You may be instructed to:  Stop any activity at once if you have chest pain, shortness of breath, irregular heartbeats, or dizziness. Get help right away if you have any of these symptoms.  Move  around frequently for short periods or take short walks as directed by your health care provider. Increase your activities gradually. You may need physical therapy or cardiac rehabilitation to help strengthen your muscles and build your endurance.  Avoid lifting, pushing, or pulling anything heavier than 10 lb (4.5 kg) for at least 6 weeks after surgery.  Do not drive until your health care provider approves.  Ask your health care provider when you may return to work and resume sexual activity.  Follow up with your health care provider as directed.  SEEK MEDICAL CARE IF:  You have swelling, redness, increasing pain, or drainage at the site of an incision.   You develop a fever.   You have swelling in your ankles or legs.   You have pain in your legs.   You have weight gain of 2 or more pounds a day.  You are nauseous or vomit.  You have diarrhea. SEEK IMMEDIATE MEDICAL CARE IF:  You have chest pain that goes to your jaw or arms.  You have shortness of breath.   You have a fast or irregular heartbeat.   You notice a "clicking" in your breastbone (sternum) when you move.   You have numbness or weakness in your arms or legs.  You feel  dizzy or lightheaded.  MAKE SURE YOU:  Understand these instructions.  Will watch your condition.  Will get help right away if you are not doing well or get worse. Document Released: 10/29/2004 Document Revised: 12/12/2012 Document Reviewed: 09/18/2012 [facility] Patient Information 2014 [place].

## 2013-09-28 NOTE — Progress Notes (Signed)
Discharge education reviewed with the patient and her family. Discussed sternal precautions, use of IS at home, Exercise guidelines and temperature precautions. Heart Healthy diet reviewed with the patient and her family. Linda Parker is interested in outpatient cardiac rehab. Permission granted by patient to contact at home after discharge.

## 2013-09-28 NOTE — Progress Notes (Signed)
       [address]       [place] [address]             [phone]          4 Days Post-Op Procedure(s) (LRB): AORTIC VALVE REPLACEMENT (AVR) using Pericardial Tissue Valve (size 17mm). (N/A) CORONARY ARTERY BYPASS GRAFTING (CABG) x4: LIMA to LAD, SVG to Diagonal 1, SVG to Diagonal 2, Svg to OM 1. (N/A) INTRAOPERATIVE TRANSESOPHAGEAL ECHOCARDIOGRAM (N/A)  Subjective: Feels well, no complaints.   Objective: Vital signs in last 24 hours: Patient Vitals for the past 24 hrs:  BP Temp Temp src Pulse Resp SpO2 Weight  09/28/13 0428 126/80 mmHg 98.2 F (36.8 C) Oral 94 18 94 % 125 lb 12.8 oz (57.063 kg)  09/27/13 2025 123/76 mmHg 98.9 F (37.2 C) Oral 90 20 95 % -  09/27/13 1350 107/65 mmHg 98.2 F (36.8 C) Oral 84 16 95 % -   Current Weight  09/28/13 125 lb 12.8 oz (57.063 kg)  Pre op weight 57 kg    Intake/Output from previous day: 06/05 0701 - 06/06 0700 In: 600 [P.O.:600] Out: 1800 [Urine:1800]    PHYSICAL EXAM:  Heart: RRR Lungs: Clear Wound: Clean and dry Extremities: Trace LE edema    Lab Results: CBC: Recent Labs  09/26/13 0345 09/27/13 0413  WBC 13.4* 11.9*  HGB 9.1* 9.1*  HCT 26.3* 26.5*  PLT 110* 120*   BMET:  Recent Labs  09/26/13 0345 09/27/13 0413  NA 137 137  K 4.1 4.2  CL 105 104  CO2 24 25  GLUCOSE 110* 95  BUN 18 20  CREATININE 0.69 0.60  CALCIUM 8.3* 8.6    PT/INR: No results found for this basename: LABPROT, INR,  in the last 72 hours    Assessment/Plan: S/P Procedure(s) (LRB): AORTIC VALVE REPLACEMENT (AVR) using Pericardial Tissue Valve (size 11mm). (N/A) CORONARY ARTERY BYPASS GRAFTING (CABG) x4: LIMA to LAD, SVG to Diagonal 1, SVG to Diagonal 2, Svg to OM 1. (N/A) INTRAOPERATIVE TRANSESOPHAGEAL ECHOCARDIOGRAM (N/A) Progressing well.   Plan d/c home today- instructions reviewed with patient.   LOS: 4 days    Coolidge Breeze 09/28/2013

## 2013-09-28 NOTE — Progress Notes (Signed)
Discharged to home with family office visits in place teaching done  

## 2013-09-30 ENCOUNTER — Encounter: Payer: Self-pay | Admitting: Surgery

## 2013-09-30 ENCOUNTER — Ambulatory Visit
Admission: RE | Admit: 2013-09-30 | Discharge: 2013-09-30 | Disposition: A | Discharge status: Home / Self Care | Payer: Medicare Other | Source: Ambulatory Visit | Attending: Surgery | Admitting: Surgery

## 2013-09-30 ENCOUNTER — Ambulatory Visit (INDEPENDENT_AMBULATORY_CARE_PROVIDER_SITE_OTHER): Payer: Self-pay | Admitting: Surgery

## 2013-09-30 ENCOUNTER — Other Ambulatory Visit: Payer: Self-pay

## 2013-09-30 VITALS — BP 122/73 | HR 101 | Resp 16 | Ht 63.0 in | Wt 126.5 lb

## 2013-09-30 DIAGNOSIS — I359 Nonrheumatic aortic valve disorder, unspecified: Secondary | ICD-10-CM

## 2013-09-30 DIAGNOSIS — I2510 Atherosclerotic heart disease of native coronary artery without angina pectoris: Secondary | ICD-10-CM

## 2013-09-30 DIAGNOSIS — Z951 Presence of aortocoronary bypass graft: Secondary | ICD-10-CM

## 2013-09-30 DIAGNOSIS — Z952 Presence of prosthetic heart valve: Secondary | ICD-10-CM

## 2013-09-30 DIAGNOSIS — I350 Nonrheumatic aortic (valve) stenosis: Secondary | ICD-10-CM

## 2013-09-30 DIAGNOSIS — Z954 Presence of other heart-valve replacement: Secondary | ICD-10-CM

## 2013-09-30 NOTE — Progress Notes (Signed)
HPI:  Patient returns for routine postoperative follow-up having undergone CABG x 4 and AVR with a 23 mm pericardial valve on 09/24/2013. The patient's early postoperative recovery while in the hospital was notable for an uncomplicated postop course. She was discharged on POD 4 because she was feeling so well and wanted to get home. This morning she called to tell me that her heart rate has been up as high as 100-109 and she is usually in the 70's. She also notes some shortness of breath and fatigue. She feels short of breath while talking. She has been using her IS a little. She has not been walking much. She has mild incisional discomfort but no other chest pain.   Current Outpatient Prescriptions  Medication Sig Dispense Refill  . aspirin EC 325 MG EC tablet Take 1 tablet (325 mg total) by mouth daily.  30 tablet  0  . Calcium-Magnesium-Vitamin D (CALCIUM MAGNESIUM PO) Take 1 tablet by mouth 2 (two) times daily.      Marland Kitchen estradiol (ESTRACE) 0.5 MG tablet Take 0.5 mg by mouth daily with breakfast.       . folic acid (FOLVITE) 517 MCG tablet Take 400 mcg by mouth daily.      . Glucosamine-Chondroitin (GLUCOSAMINE CHONDR COMPLEX PO) Take 1 tablet by mouth 2 (two) times daily.      Marland Kitchen levothyroxine (SYNTHROID, LEVOTHROID) 50 MCG tablet Take 50 mcg by mouth daily before breakfast.      . metoprolol tartrate (LOPRESSOR) 12.5 mg TABS tablet Take 0.5 tablets (12.5 mg total) by mouth 2 (two) times daily.  30 tablet  1  . Multiple Vitamin (MULTIVITAMIN WITH MINERALS) TABS tablet Take 1 tablet by mouth daily.      . ondansetron (ZOFRAN) 4 MG tablet Take 4 mg by mouth every 6 (six) hours as needed for nausea or vomiting.      . simvastatin (ZOCOR) 20 MG tablet Take 1 tablet (20 mg total) by mouth daily at 6 PM.  30 tablet  1  . traMADol (ULTRAM) 50 MG tablet Take 1-2 tablets (50-100 mg total) by mouth every 4 (four) hours as needed for moderate pain.  30 tablet  0  . vitamin E 400 UNIT capsule Take  400 Units by mouth daily.       No current facility-administered medications for this visit.    Physical Exam: BP 122/73  Pulse 101  Resp 16  Ht 5\' 3"  (1.6 m)  Wt 126 lb 8 oz (57.38 kg)  BMI 22.41 kg/m2  SpO2 96% Rhythm strip shows sinus rhythm She looks well but is in her nightgown and bedroom slippers. Lung exam is clear Cardiac exam shows a regular rate and rhythm with normal pericardial valve sounds. There is no murmur. The chest incision is healing well and the sternum is stable. The right leg incisions are healing. There is mild right leg edema and ecchymosis over the thigh. There is trace edema in the left leg.  Diagnostic Tests:  CLINICAL DATA: 78 year old female status post aortic valve  replacement last week. Shortness of Breath. Initial encounter.  EXAM:  CHEST 2 VIEW  COMPARISON: 09/27/2003 and earlier.  FINDINGS:  Sequelae of CABG and cardiac valve replacement with median  sternotomy. Stable cardiac size and mediastinal contours. Small  bilateral pleural effusions. No pneumothorax or pulmonary edema. No  confluent pulmonary opacity elsewhere. No acute osseous abnormality  identified. Calcified atherosclerosis of the aorta.  IMPRESSION:  Small bilateral pleural effusions status  post recent CABG and  cardiac valve replacement.  Electronically Signed  By: Lars Pinks M.D.  On: 09/30/2013 10:07    Impression:  I think she is doing well overall. She has mild atelectasis in the bases and tiny bilateral pleural effusions. She has mild lower extremity edema and probably has mild volume excess. There is no sign of pneumonia or heart failure. Her EF was normal preop. I doubt she has had a pulmonary embolism with no chest pain and oxygen sats of 96% on room air. Her Hgb on 6/5 was 9.1. She has mild sinus tachycardia.  Plan:  I have increased her Lopressor to 25 mg bid.  I started Lasix 40 mg daily for 5 days.  I started Kdur 20 meq daily for 5 days.  I instructed  her to use her IS every hr, 10 breaths.  I told he to get dressed in the morning and start walking.  She has a follow up appt with me in a couple weeks.

## 2013-10-03 ENCOUNTER — Ambulatory Visit (INDEPENDENT_AMBULATORY_CARE_PROVIDER_SITE_OTHER): Payer: Self-pay

## 2013-10-03 DIAGNOSIS — I2510 Atherosclerotic heart disease of native coronary artery without angina pectoris: Secondary | ICD-10-CM

## 2013-10-03 DIAGNOSIS — Z4802 Encounter for removal of sutures: Secondary | ICD-10-CM

## 2013-10-03 NOTE — Progress Notes (Signed)
Removed 4 sutures from chest tube sites, no signs of infection and patient tolerated well. 

## 2013-10-10 ENCOUNTER — Telehealth: Payer: Self-pay | Admitting: Cardiology

## 2013-10-10 LAB — PULMONARY FUNCTION TEST
DL/VA % pred: 88 %
DL/VA: 4.16 ml/min/mmHg/L
DLCO COR: 15.31 ml/min/mmHg
DLCO cor % pred: 66 %
DLCO unc % pred: 69 %
DLCO unc: 15.85 ml/min/mmHg
FEF 25-75 Pre: 1.22 L/sec
FEF2575-%PRED-PRE: 78 %
FEV1-%Pred-Pre: 89 %
FEV1-Pre: 1.78 L
FEV1FVC-%PRED-PRE: 98 %
FEV6-%Pred-Pre: 95 %
FEV6-Pre: 2.4 L
FEV6FVC-%PRED-PRE: 104 %
FVC-%PRED-PRE: 90 %
FVC-Pre: 2.41 L
PRE FEV6/FVC RATIO: 100 %
Pre FEV1/FVC ratio: 74 %
RV % pred: 87 %
RV: 1.99 L
TLC % PRED: 94 %
TLC: 4.62 L

## 2013-10-10 NOTE — Telephone Encounter (Signed)
Spoke with pt, nurse room visit for EKG made.

## 2013-10-10 NOTE — Telephone Encounter (Signed)
Check ECG; continue present meds [name]

## 2013-10-10 NOTE — Telephone Encounter (Signed)
New message     Talk to Hilda Blades.  Want copy of 5-18 blood work.  He called and talked to medical records but he thinks you can get it "better".

## 2013-10-10 NOTE — Telephone Encounter (Addendum)
Spoke with pt, copy of labs from 09-09-13 mailed to pt. She reports her heart rate is elevated. Her lopressor was increased to 25 mg bid for this issue on 09-30-13 by the surgeon. Her pulse is ranging from 103 to 77 and bp from 124/86 to 96/71. She reports it is regular and she is not out of rhythm. She can feel her heart pounding when rate is elevated. She is fatigued. Will make dr Stanford Breed aware.

## 2013-10-11 ENCOUNTER — Ambulatory Visit (INDEPENDENT_AMBULATORY_CARE_PROVIDER_SITE_OTHER): Payer: Medicare Other | Admitting: *Deleted

## 2013-10-11 VITALS — BP 118/70 | HR 80 | Resp 20 | Wt 124.2 lb

## 2013-10-11 DIAGNOSIS — R002 Palpitations: Secondary | ICD-10-CM

## 2013-10-11 DIAGNOSIS — I359 Nonrheumatic aortic valve disorder, unspecified: Secondary | ICD-10-CM

## 2013-10-11 NOTE — Progress Notes (Signed)
1.) Reason for visit: EKG  2.) Name of MD requesting visit: crenshaw  3.) H&P: s/p AVR  4.) ROS related to problem: EKG confirmed with DOD, dr Meda Coffee, pt is currently in sinus rhythm. She denies SOB or chest pain.             Her pulse at home has ranged from 106 to 76. Reassurance given to pt that vital signs and EKG are normal. She will cont on her current             Medication. Will forward for dr Stanford Breed review   5.) Assessment and plan per MD: EKG reviewed by dr Stanford Breed. No change in current treatment.

## 2013-10-15 ENCOUNTER — Telehealth: Payer: Self-pay | Admitting: Cardiology

## 2013-10-15 NOTE — Telephone Encounter (Signed)
Spoke with pt husband, called to report bp is running 138-109/57-81 with pulse rates 110 to 70. Pt has days of feeling tired and days of feeling good. Aware lab work has been placed in the mail. Will forward for dr Stanford Breed review

## 2013-10-15 NOTE — Telephone Encounter (Signed)
New message    1. patient husband waiting on form since last week  2. Blood pressure today @ 6:30 am 138/81 heart rate 109.      Went Walking  @ 7 am  124/81 heart rate 110 .    3.now heart rate is  70.

## 2013-10-16 NOTE — Telephone Encounter (Signed)
Continue present regimen [name]'

## 2013-10-18 NOTE — Telephone Encounter (Signed)
Spoke with pt husband, Aware of dr crenshaw's recommendations.  

## 2013-10-23 ENCOUNTER — Ambulatory Visit (INDEPENDENT_AMBULATORY_CARE_PROVIDER_SITE_OTHER): Payer: Medicare Other | Admitting: Physician Assistant

## 2013-10-23 ENCOUNTER — Encounter (INDEPENDENT_AMBULATORY_CARE_PROVIDER_SITE_OTHER): Payer: Self-pay

## 2013-10-23 ENCOUNTER — Encounter: Payer: Self-pay | Admitting: Physician Assistant

## 2013-10-23 VITALS — BP 121/78 | HR 92 | Ht 63.0 in | Wt 120.0 lb

## 2013-10-23 DIAGNOSIS — I2510 Atherosclerotic heart disease of native coronary artery without angina pectoris: Secondary | ICD-10-CM

## 2013-10-23 DIAGNOSIS — Z952 Presence of prosthetic heart valve: Secondary | ICD-10-CM

## 2013-10-23 DIAGNOSIS — I359 Nonrheumatic aortic valve disorder, unspecified: Secondary | ICD-10-CM

## 2013-10-23 DIAGNOSIS — E785 Hyperlipidemia, unspecified: Secondary | ICD-10-CM

## 2013-10-23 DIAGNOSIS — R002 Palpitations: Secondary | ICD-10-CM

## 2013-10-23 DIAGNOSIS — Z954 Presence of other heart-valve replacement: Secondary | ICD-10-CM

## 2013-10-23 MED ORDER — METOPROLOL TARTRATE 25 MG PO TABS: 25.0000 mg | ORAL_TABLET | Freq: Two times a day (BID) | ORAL | Status: DC

## 2013-10-23 NOTE — Progress Notes (Signed)
Cardiology Office Note    Date:  10/23/2013   ID:  Linda Parker, DOB [birth year], MRN [id number]  PCP:  Haywood Pao, MD  Cardiologist:  Dr. [name]      History of Present Illness: Linda Parker is a 78 y.o. female with a history of aortic stenosis. Echocardiogram in May demonstrated severe aortic stenosis and worsening gradients. She was symptomatic and referred for cardiac catheterization. This demonstrated 3 vessel CAD. She was referred for CABG and AVR.  She was admitted 6/2-6/6 and underwent CABG (LIMA-LAD, SVG-D1, SVG-D2, SVG-OM1) and bioprosthetic AVR (23 mm Edwards magna-ease pericardial valve). Postoperative course was fairly uneventful.  She remained in sinus rhythm. She returns for follow up.  She returns for followup with her husband. She has many questions. Of note, she has had problems with increased heart rate since discharge. She contacted the office and her metoprolol was increased to 25 mg twice a day.  She continues to note elevated heart rates in the morning as well as fatigue. After she takes her medications and increases her activity, her symptoms improve. Her chest is still somewhat sore. She denies significant dyspnea. She denies fever or cough. She denies significant edema. She denies syncope.   Studies:  - LHC (09/17/13):  D1 90%, LAD 50%, then 80-90%, D2 ostial 75%, proximal circumflex 50-60%, OM 50%, proximal RCA 30-40%, mid RCA 30-40%.  - Echo (09/02/13):  Mild LVH, mild focal basal hypertrophy of the septum, EF 60-65%, normal wall motion, grade 1 diastolic dysfunction, severe aortic stenosis (mean gradient 49 mm Hg)  - Nuclear (09/06/11):  Normal, EF 77%  - Carotid US (09/20/13):  Bilateral ICA 1-39%   Recent Labs: 09/20/2013: ALT 15  09/27/2013: Creatinine 0.60; Hemoglobin 9.1*; Potassium 4.2   Wt Readings from Last 3 Encounters:  10/23/13 120 lb (54.432 kg)  10/11/13 124 lb 3.2 oz (56.337 kg)  09/30/13 126 lb 8 oz (57.38 kg)     Past Medical  History  Diagnosis Date  . Unspecified hypothyroidism   . Aortic stenosis   . Palpitations   . Dysrhythmia     when taking antihistamines  . Heart murmur   . Arthritis     hands & knee- L     Current Outpatient Prescriptions  Medication Sig Dispense Refill  . aspirin 81 MG tablet Take 81 mg by mouth daily.      . Calcium-Magnesium-Vitamin D (CALCIUM MAGNESIUM PO) Take 1 tablet by mouth 2 (two) times daily.      Marland Kitchen estradiol (ESTRACE) 0.5 MG tablet Take 0.5 mg by mouth daily with breakfast.       . folic acid (FOLVITE) 212 MCG tablet Take 400 mcg by mouth daily.      . Glucosamine-Chondroitin (GLUCOSAMINE CHONDR COMPLEX PO) Take 1 tablet by mouth 2 (two) times daily.      Marland Kitchen levothyroxine (SYNTHROID, LEVOTHROID) 50 MCG tablet Take 50 mcg by mouth daily before breakfast.      . metoprolol tartrate (LOPRESSOR) 12.5 mg TABS tablet Take 25 mg by mouth 2 (two) times daily.      . Multiple Vitamin (MULTIVITAMIN WITH MINERALS) TABS tablet Take 1 tablet by mouth daily.      . simvastatin (ZOCOR) 20 MG tablet Take 1 tablet (20 mg total) by mouth daily at 6 PM.  30 tablet  1  . traMADol (ULTRAM) 50 MG tablet Take 1-2 tablets (50-100 mg total) by mouth every 4 (four) hours as needed for moderate pain.  [place]  tablet  0  . vitamin E 400 UNIT capsule Take 400 Units by mouth daily.       No current facility-administered medications for this visit.    Allergies:   Erythromycin; Monosodium glutamate; Nitrates, organic; and Sulfonamide derivatives   Social History:  The patient  reports that she has never smoked. She has never used smokeless tobacco. She reports that she drinks about .6 ounces of alcohol per week. She reports that she does not use illicit drugs.   Family History:  The patient's family history is not on file.   ROS:  Please see the history of present illness.      All other systems reviewed and negative.   PHYSICAL EXAM: VS:  BP 121/78  Pulse 92  Ht 5\' 3"  (1.6 m)  Wt 120 lb (54.432  kg)  BMI 21.26 kg/m2 Well nourished, well developed, in no acute distress HEENT: normal Neck: no JVD Cardiac:  normal S1, S2; RRR; no murmur Lungs:  clear to auscultation bilaterally, no wheezing, rhonchi or rales Abd: soft, nontender, no hepatomegaly Ext: trace RLE edem[place]ith R thigh ecchymosis noted; SVG site well healed Skin: warm and dry Neuro:  CNs 2-12 intact, no focal abnormalities noted  EKG:  NSR, HR 92, normal axis, PAC     ASSESSMENT AND PLAN:  1. CAD s/p CABG:  She is slowly progressing after recent CABG plus AVR.  She has been contacted by cardiac rehabilitation. I have encouraged her to pursue this. She had many questions today regarding her surgery. I tried to answer all those for her. Her husband, who is with her, had several questions about her medications. He had many different opinions about adjusting her medications in different ways. She has had some elevated heart rates in the mornings. Her husband would like to decrease her metoprolol. I have suggested that she continue her current dose of metoprolol. I have suggested that she take her metoprolol 12 hours apart to see if this alleviates some of her a.m. Palpitations. She may be able to decrease her metoprolol down to 12.5 mg twice a day if her palpitations are more controlled. She will need to continue aspirin. Her husband preferred that she not take simvastatin. I reviewed the current guidelines and benefits of statin therapy in the setting of CAD. I spent greater than 30 minutes counseling and answering questions today. The patient became somewhat tearful at one point in the setting of persistence by her husband to not take statin therapy. The patient expressed her wishes to continue simvastatin upon my recommendation. 2. Aortic Stenosis S/P AVR:  Arrange follow up 2D Echocardiogram without contrast. 3. Palpitations:  If symptoms persist, arrange 24 hr holter. 4. HLD (hyperlipidemia):  Continue statin.  Recent LDL 85 (6/29  with PCP). 5. Disposition:  F/u with Dr. [name] in 6-8 weeks.    Signed, Versie Starks, MHS 10/23/2013 2:48 PM    [facility] [place], [place], [place]  [address] Phone: [phone]; Fax: [phone]

## 2013-10-23 NOTE — Patient Instructions (Signed)
MAKE SURE TO SPACE METOPROLOL OUT EVERY 12 HOURS  Your physician has requested that you have an echocardiogram. Echocardiography is a painless test that uses sound waves to create images of your heart. It provides your doctor with information about the size and shape of your heart and how well your heart's chambers and valves are working. This procedure takes approximately one hour. There are no restrictions for this procedure.   Your physician recommends that you schedule a follow-up appointment in: [place] DR. CRENSHAW

## 2013-10-30 ENCOUNTER — Ambulatory Visit
Admission: RE | Admit: 2013-10-30 | Discharge: 2013-10-30 | Disposition: A | Discharge status: Home / Self Care | Payer: Medicare Other | Source: Ambulatory Visit | Attending: Surgery | Admitting: Surgery

## 2013-10-30 ENCOUNTER — Ambulatory Visit (INDEPENDENT_AMBULATORY_CARE_PROVIDER_SITE_OTHER): Payer: Self-pay | Admitting: Surgery

## 2013-10-30 ENCOUNTER — Other Ambulatory Visit: Payer: Self-pay | Admitting: Surgery

## 2013-10-30 ENCOUNTER — Encounter: Payer: Self-pay | Admitting: Surgery

## 2013-10-30 VITALS — BP 117/75 | HR 78 | Resp 16 | Ht 63.0 in | Wt 120.0 lb

## 2013-10-30 DIAGNOSIS — Z952 Presence of prosthetic heart valve: Secondary | ICD-10-CM

## 2013-10-30 DIAGNOSIS — I2510 Atherosclerotic heart disease of native coronary artery without angina pectoris: Secondary | ICD-10-CM

## 2013-10-30 DIAGNOSIS — Z951 Presence of aortocoronary bypass graft: Secondary | ICD-10-CM

## 2013-10-30 DIAGNOSIS — I359 Nonrheumatic aortic valve disorder, unspecified: Secondary | ICD-10-CM

## 2013-10-30 DIAGNOSIS — I350 Nonrheumatic aortic (valve) stenosis: Secondary | ICD-10-CM

## 2013-10-30 DIAGNOSIS — Z954 Presence of other heart-valve replacement: Secondary | ICD-10-CM

## 2013-10-30 NOTE — Progress Notes (Signed)
     HPI:  Patient returns for routine postoperative follow-up having undergone CABG x 4 and AVR with a pericardial valve on 09/24/2013. The patient's early postoperative recovery while in the hospital was notable for an uncomplicated postop course. Since hospital discharge the patient reports that she has had a slow recovery. She complains of her heart rate being elevated in the early morning to 102 and she feels some shortness of breath at the same time but this all resolves after she gets up moving around. She is walking without any chest discomfort or shortness of breath .   Current Outpatient Prescriptions  Medication Sig Dispense Refill  . aspirin 81 MG tablet Take 81 mg by mouth daily.      . Calcium-Magnesium-Vitamin D (CALCIUM MAGNESIUM PO) Take 1 tablet by mouth 2 (two) times daily.      Marland Kitchen estradiol (ESTRACE) 0.5 MG tablet Take 0.5 mg by mouth daily with breakfast.       . folic acid (FOLVITE) 494 MCG tablet Take 400 mcg by mouth daily.      . Glucosamine-Chondroitin (GLUCOSAMINE CHONDR COMPLEX PO) Take 1 tablet by mouth 2 (two) times daily.      Marland Kitchen levothyroxine (SYNTHROID, LEVOTHROID) 50 MCG tablet Take 50 mcg by mouth daily before breakfast.      . metoprolol tartrate (LOPRESSOR) 25 MG tablet Take 1 tablet (25 mg total) by mouth every 12 (twelve) hours.      . simvastatin (ZOCOR) 20 MG tablet Take 1 tablet (20 mg total) by mouth daily at 6 PM.  30 tablet  1  . vitamin E 400 UNIT capsule Take 400 Units by mouth daily.       No current facility-administered medications for this visit.    Physical Exam: BP 117/75  Pulse 78  Resp 16  Ht 5\' 3"  (1.6 m)  Wt 120 lb (54.432 kg)  BMI 21.26 kg/m2  SpO2 98% She looks well. Lung exam is clear. Cardiac exam shows a regular rate and rhythm with normal heart sounds. Chest incision is healing well and sternum is stable. The leg incisions are healing well and there is no peripheral edema.   Diagnostic Tests:  CLINICAL DATA: Aortic  valve replacement. CABG.  EXAM:  CHEST 2 VIEW  COMPARISON: 09/30/2013.  FINDINGS:  Mediastinum and hilar structures are normal. Lungs are clear. Stable  small left pleural effusion. Right pleural effusion has cleared. No  focal pulmonary infiltrate. Apical pleural parenchymal thickening  again noted consistent with scarring. Prior aortic valve replacement  and CABG. Cardiomegaly, normal pulmonary vascularity. Heart size  stable. No acute osseous abnormality.  IMPRESSION:  1. Stable small left pleural effusion. Previously identified right  pleural effusion has cleared. No acute pulmonary disease.  2. Heart size and pulmonary vascularity stable. Prior CABG and  aortic valve replacement.  Electronically Signed  By: Marcello Moores Register  On: 10/30/2013 12:37   Impression:  Overall I think she is doing well. I encouraged her to continue walking. She is planning to participate in cardiac rehab and that should help her build up some stamina.  I told her she could drive her car but should not lift anything heavier than 10 lbs for three months postop.    Plan:  She has a followup appt with Dr. Stanford Breed in the near future. She will contact me if she develops any problems with her incisions.

## 2013-11-07 ENCOUNTER — Inpatient Hospital Stay (HOSPITAL_COMMUNITY)
Admission: RE | Admit: 2013-11-07 | Discharge: 2013-11-07 | Disposition: A | Discharge status: Home / Self Care | Payer: Medicare Other | Source: Ambulatory Visit

## 2013-11-07 NOTE — Progress Notes (Signed)
Cardiac Rehab Medication Review by a Pharmacist  Does the patient  feel that his/her medications are working for him/her?  yes  Has the patient been experiencing any side effects to the medications prescribed?  no  Does the patient measure his/her own blood pressure or blood glucose at home?  yes   Does the patient have any problems obtaining medications due to transportation or finances?   no  Understanding of regimen: good Understanding of indications: excellent Potential of compliance: excellent    Pharmacist comments: Patient demonstrates a good understanding and verbalizes understanding. She mentions that her heart rate has been elevated lately, which could warrant an increase in her metoprolol.    Thank you for allowing pharmacy to be part of this patients care team  Markees Carns M. Birdie Beveridge, Pharm.D Clinical Pharmacy Resident Pager: [phone] 11/07/2013 .8:56 AM

## 2013-11-11 ENCOUNTER — Encounter (HOSPITAL_COMMUNITY)
Admission: RE | Admit: 2013-11-11 | Discharge: 2013-11-11 | Disposition: A | Discharge status: Home / Self Care | Payer: Medicare Other | Source: Ambulatory Visit | Attending: Cardiology | Admitting: Cardiology

## 2013-11-11 ENCOUNTER — Encounter (HOSPITAL_COMMUNITY): Payer: Self-pay

## 2013-11-11 DIAGNOSIS — Z5189 Encounter for other specified aftercare: Secondary | ICD-10-CM | POA: Insufficient documentation

## 2013-11-11 DIAGNOSIS — R079 Chest pain, unspecified: Secondary | ICD-10-CM | POA: Insufficient documentation

## 2013-11-11 DIAGNOSIS — R002 Palpitations: Secondary | ICD-10-CM | POA: Diagnosis not present

## 2013-11-11 DIAGNOSIS — I2510 Atherosclerotic heart disease of native coronary artery without angina pectoris: Secondary | ICD-10-CM | POA: Diagnosis not present

## 2013-11-11 DIAGNOSIS — I359 Nonrheumatic aortic valve disorder, unspecified: Secondary | ICD-10-CM | POA: Diagnosis present

## 2013-11-11 NOTE — Progress Notes (Signed)
Pt started cardiac rehab today.  Pt tolerated light exercise without difficulty.  VSS, telemetry-nsr, occasional PAC.  Asymptomatic. Pt heart rate minimally elevated with light activity. Pt unable to tolerate bicycle due to leg pain and pt unable to tolerate arm activities due to tendonitis. Pt able to walk without difficulty.  Will plan to incorporate equipment as tolerated.  PHQ-0.  However pt admits to feelings of frustration and tearfulness r/t her cardiac illness recovery. Pt states this is improving. Pt had knee replacement surgery 6 months ago and feels she is still recovering from that. Pt denies other loss or change.  Pt appears to have good coping skills, positive outlook and supportive husband.  Pt oriented to exercise equipment and routine.  Understanding verbalized.

## 2013-11-13 ENCOUNTER — Encounter (HOSPITAL_COMMUNITY)
Admission: RE | Admit: 2013-11-13 | Discharge: 2013-11-13 | Disposition: A | Discharge status: Home / Self Care | Payer: Medicare Other | Source: Ambulatory Visit | Attending: Cardiology | Admitting: Cardiology

## 2013-11-13 DIAGNOSIS — Z5189 Encounter for other specified aftercare: Secondary | ICD-10-CM | POA: Diagnosis not present

## 2013-11-15 ENCOUNTER — Encounter (HOSPITAL_COMMUNITY)
Admission: RE | Admit: 2013-11-15 | Discharge: 2013-11-15 | Disposition: A | Discharge status: Home / Self Care | Payer: Medicare Other | Source: Ambulatory Visit | Attending: Cardiology | Admitting: Cardiology

## 2013-11-15 DIAGNOSIS — Z5189 Encounter for other specified aftercare: Secondary | ICD-10-CM | POA: Diagnosis not present

## 2013-11-18 ENCOUNTER — Encounter (HOSPITAL_COMMUNITY)
Admission: RE | Admit: 2013-11-18 | Discharge: 2013-11-18 | Disposition: A | Discharge status: Home / Self Care | Payer: Medicare Other | Source: Ambulatory Visit | Attending: Cardiology | Admitting: Cardiology

## 2013-11-18 DIAGNOSIS — Z5189 Encounter for other specified aftercare: Secondary | ICD-10-CM | POA: Diagnosis not present

## 2013-11-18 NOTE — Progress Notes (Signed)
[place] exercise guidelines reviewed with patient by David Martinique, academic intern, including endpoints, temperature precautions, target heart rate and rate of perceived exertion. Pt is walking as her mode of home exercise, patient also has a treadmill at home. Pt voices understanding of instructions given. Sol Passer, MS, ACSM CCEP

## 2013-11-20 ENCOUNTER — Encounter (HOSPITAL_COMMUNITY)
Admission: RE | Admit: 2013-11-20 | Discharge: 2013-11-20 | Disposition: A | Discharge status: Home / Self Care | Payer: Medicare Other | Source: Ambulatory Visit | Attending: Cardiology | Admitting: Cardiology

## 2013-11-20 DIAGNOSIS — Z5189 Encounter for other specified aftercare: Secondary | ICD-10-CM | POA: Diagnosis not present

## 2013-11-22 ENCOUNTER — Encounter (HOSPITAL_COMMUNITY)
Admission: RE | Admit: 2013-11-22 | Discharge: 2013-11-22 | Disposition: A | Discharge status: Home / Self Care | Payer: Medicare Other | Source: Ambulatory Visit | Attending: Cardiology | Admitting: Cardiology

## 2013-11-22 DIAGNOSIS — Z5189 Encounter for other specified aftercare: Secondary | ICD-10-CM | POA: Diagnosis not present

## 2013-11-25 ENCOUNTER — Encounter (HOSPITAL_COMMUNITY)
Admission: RE | Admit: 2013-11-25 | Discharge: 2013-11-25 | Disposition: A | Discharge status: Home / Self Care | Payer: Medicare Other | Source: Ambulatory Visit | Attending: Cardiology | Admitting: Cardiology

## 2013-11-25 ENCOUNTER — Other Ambulatory Visit: Payer: Self-pay

## 2013-11-25 DIAGNOSIS — Z954 Presence of other heart-valve replacement: Secondary | ICD-10-CM | POA: Insufficient documentation

## 2013-11-25 DIAGNOSIS — Z951 Presence of aortocoronary bypass graft: Secondary | ICD-10-CM | POA: Insufficient documentation

## 2013-11-25 MED ORDER — SIMVASTATIN 20 MG PO TABS: 20.0000 mg | ORAL_TABLET | Freq: Every day | ORAL | Status: DC

## 2013-11-27 ENCOUNTER — Encounter (HOSPITAL_COMMUNITY)
Admission: RE | Admit: 2013-11-27 | Discharge: 2013-11-27 | Disposition: A | Discharge status: Home / Self Care | Payer: Medicare Other | Source: Ambulatory Visit | Attending: Cardiology | Admitting: Cardiology

## 2013-11-27 DIAGNOSIS — Z951 Presence of aortocoronary bypass graft: Secondary | ICD-10-CM | POA: Diagnosis not present

## 2013-11-27 LAB — GLUCOSE, CAPILLARY: Glucose-Capillary: 91 mg/dL (ref 70–99)

## 2013-11-29 ENCOUNTER — Encounter (HOSPITAL_COMMUNITY): Payer: Medicare Other

## 2013-12-02 ENCOUNTER — Encounter (HOSPITAL_COMMUNITY)
Admission: RE | Admit: 2013-12-02 | Discharge: 2013-12-02 | Disposition: A | Discharge status: Home / Self Care | Payer: Medicare Other | Source: Ambulatory Visit | Attending: Cardiology | Admitting: Cardiology

## 2013-12-02 ENCOUNTER — Ambulatory Visit (HOSPITAL_COMMUNITY)
Admission: RE | Admit: 2013-12-02 | Discharge: 2013-12-02 | Disposition: A | Discharge status: Home / Self Care | Payer: Medicare Other | Source: Ambulatory Visit | Attending: Cardiovascular Disease | Admitting: Cardiovascular Disease

## 2013-12-02 ENCOUNTER — Encounter: Payer: Self-pay | Admitting: Physician Assistant

## 2013-12-02 DIAGNOSIS — Z954 Presence of other heart-valve replacement: Secondary | ICD-10-CM | POA: Insufficient documentation

## 2013-12-02 DIAGNOSIS — I2510 Atherosclerotic heart disease of native coronary artery without angina pectoris: Secondary | ICD-10-CM | POA: Diagnosis not present

## 2013-12-02 DIAGNOSIS — Z951 Presence of aortocoronary bypass graft: Secondary | ICD-10-CM | POA: Diagnosis not present

## 2013-12-02 DIAGNOSIS — I359 Nonrheumatic aortic valve disorder, unspecified: Secondary | ICD-10-CM | POA: Diagnosis not present

## 2013-12-02 DIAGNOSIS — Z952 Presence of prosthetic heart valve: Secondary | ICD-10-CM

## 2013-12-02 DIAGNOSIS — I369 Nonrheumatic tricuspid valve disorder, unspecified: Secondary | ICD-10-CM

## 2013-12-02 NOTE — Progress Notes (Signed)
2D Echo Performed 12/02/2013    Marygrace Drought, RCS

## 2013-12-03 ENCOUNTER — Telehealth: Payer: Self-pay | Admitting: *Deleted

## 2013-12-03 NOTE — Telephone Encounter (Signed)
lmptcb for echo results 

## 2013-12-03 NOTE — Telephone Encounter (Signed)
pt's husband cb for results, said pt was not feeling well today. Pt's husband has been notified about echo results and that the aortic valve replacement is functioning normally. Husband aware to keep f/u w/Dr. Stanford Breed on 12/12/13.

## 2013-12-04 ENCOUNTER — Encounter (HOSPITAL_COMMUNITY)
Admission: RE | Admit: 2013-12-04 | Discharge: 2013-12-04 | Disposition: A | Discharge status: Home / Self Care | Payer: Medicare Other | Source: Ambulatory Visit | Attending: Cardiology | Admitting: Cardiology

## 2013-12-04 DIAGNOSIS — Z951 Presence of aortocoronary bypass graft: Secondary | ICD-10-CM | POA: Diagnosis not present

## 2013-12-06 ENCOUNTER — Encounter (HOSPITAL_COMMUNITY)
Admission: RE | Admit: 2013-12-06 | Discharge: 2013-12-06 | Disposition: A | Discharge status: Home / Self Care | Payer: Medicare Other | Source: Ambulatory Visit | Attending: Cardiology | Admitting: Cardiology

## 2013-12-06 DIAGNOSIS — Z951 Presence of aortocoronary bypass graft: Secondary | ICD-10-CM | POA: Diagnosis not present

## 2013-12-09 ENCOUNTER — Encounter (HOSPITAL_COMMUNITY)
Admission: RE | Admit: 2013-12-09 | Discharge: 2013-12-09 | Disposition: A | Discharge status: Home / Self Care | Payer: Medicare Other | Source: Ambulatory Visit | Attending: Cardiology | Admitting: Cardiology

## 2013-12-09 DIAGNOSIS — Z951 Presence of aortocoronary bypass graft: Secondary | ICD-10-CM | POA: Diagnosis not present

## 2013-12-11 ENCOUNTER — Encounter (HOSPITAL_COMMUNITY)
Admission: RE | Admit: 2013-12-11 | Discharge: 2013-12-11 | Disposition: A | Discharge status: Home / Self Care | Payer: Medicare Other | Source: Ambulatory Visit | Attending: Cardiology | Admitting: Cardiology

## 2013-12-11 DIAGNOSIS — Z951 Presence of aortocoronary bypass graft: Secondary | ICD-10-CM | POA: Diagnosis not present

## 2013-12-12 ENCOUNTER — Ambulatory Visit (INDEPENDENT_AMBULATORY_CARE_PROVIDER_SITE_OTHER): Payer: Medicare Other | Admitting: Cardiology

## 2013-12-12 ENCOUNTER — Encounter: Payer: Self-pay | Admitting: Cardiology

## 2013-12-12 VITALS — BP 112/58 | HR 62 | Ht 63.0 in | Wt 121.0 lb

## 2013-12-12 DIAGNOSIS — I359 Nonrheumatic aortic valve disorder, unspecified: Secondary | ICD-10-CM

## 2013-12-12 DIAGNOSIS — I2510 Atherosclerotic heart disease of native coronary artery without angina pectoris: Secondary | ICD-10-CM

## 2013-12-12 MED ORDER — METOPROLOL SUCCINATE ER 25 MG PO TB24: 25.0000 mg | ORAL_TABLET | Freq: Every day | ORAL | Status: DC

## 2013-12-12 MED ORDER — ATORVASTATIN CALCIUM 40 MG PO TABS: 40.0000 mg | ORAL_TABLET | Freq: Every day | ORAL | Status: DC

## 2013-12-12 NOTE — Assessment & Plan Note (Signed)
Status post aortic valve replacement. Doing well postoperatively. SBE prophylaxis.

## 2013-12-12 NOTE — Assessment & Plan Note (Signed)
Continue aspirin. Discontinue Zocor. Begin Lipitor 40 mg daily. Check lipids, liver and CK in 6 weeks.

## 2013-12-12 NOTE — Patient Instructions (Signed)
Your physician wants you to follow-up in: [place] will receive a reminder letter in the mail two months in advance. If you don't receive a letter, please call our office to schedule the follow-up appointment.   STOP LOPRESSOR  START METOPROLOL SUCC ER 25 MG ONCE DAILY AT BEDTIME  STOP SIMVASTATIN  START ATORVASTATIN 40 MG ONCE DAILY  Your physician recommends that you return for lab work in: 6 WEEKS-DO NOT EAT PRIOR TO LAB WORK

## 2013-12-12 NOTE — Progress Notes (Signed)
HPI: FU AVR and CAD. Echocardiogram in May 2015 demonstrated severe aortic stenosis. She was symptomatic and referred for cardiac catheterization. This demonstrated 3 vessel CAD. She was referred for CABG and AVR. She was admitted 6/15 and underwent CABG (LIMA-LAD, SVG-D1, SVG-D2, SVG-OM1) and bioprosthetic AVR (23 mm Edwards magna-ease pericardial valve). Note preoperative carotid Dopplers may 2015 showed bilateral 1-39% stenosis. Postoperative echocardiogram showed normal LV function, grade 1 diastolic dysfunction, bioprosthetic aortic valve with mean gradient 10 mm of mercury. Trace aortic insufficiency. Moderate tricuspid regurgitation. Since last seen, She denies dyspnea, chest pain, syncope. She occasionally feels her heart rate elevated in the morning prior to taking metoprolol.      Current Outpatient Prescriptions  Medication Sig Dispense Refill  . acetaminophen (TYLENOL) 500 MG tablet Take 500 mg by mouth daily as needed (tendonitis).      Marland Kitchen aspirin 81 MG tablet Take 81 mg by mouth daily.      . Calcium-Magnesium-Vitamin D (CALCIUM MAGNESIUM PO) Take 1 tablet by mouth daily.       . cholecalciferol (VITAMIN D) 1000 UNITS tablet Take 2,000 Units by mouth daily.       Marland Kitchen estradiol (ESTRACE) 0.5 MG tablet Take 0.5 mg by mouth daily before breakfast.       . folic acid (FOLVITE) 400 MCG tablet Take 400 mcg by mouth daily.      . Glucosamine-Chondroitin (GLUCOSAMINE CHONDR COMPLEX PO) Take 1 tablet by mouth 2 (two) times daily.      Marland Kitchen levothyroxine (SYNTHROID, LEVOTHROID) 50 MCG tablet Take 50 mcg by mouth daily before breakfast.      . metoprolol tartrate (LOPRESSOR) 25 MG tablet Take 1 tablet (25 mg total) by mouth every 12 (twelve) hours.      . Misc Natural Products (TART CHERRY ADVANCED PO) Take 1,000 mg by mouth.      . omega-3 acid ethyl esters (LOVAZA) 1 G capsule Take 1 g by mouth 2 (two) times daily.      . simvastatin (ZOCOR) 20 MG tablet Take 1 tablet (20 mg total) by  mouth daily at 6 PM.  30 tablet  1  . vitamin E 400 UNIT capsule Take 400 Units by mouth daily.       No current facility-administered medications for this visit.     Past Medical History  Diagnosis Date  . Unspecified hypothyroidism   . Aortic stenosis   . Palpitations   . Dysrhythmia     when taking antihistamines  . Heart murmur   . Arthritis     hands & knee- L   . History of echocardiogram     Echo (8/15): EF 55-60%, normal wall motion, grade 1 diastolic dysfunction, AVR okay (mean 10 mm Hg), normal RV function, moderate TR    Past Surgical History  Procedure Laterality Date  . Finger surgery      left hand  . Bilateral knee surgery  35 yrs. ago    cartilage removed from knees  . Abdominal hysterectomy    . Tonsillectomy    . Appendectomy      at time of hysterectomy  . Total knee arthroplasty Right 03/04/2013    Procedure: RIGHT TOTAL KNEE ARTHROPLASTY;  Surgeon: Gearlean Alf, MD;  Location: [facility];  Service: Orthopedics;  Laterality: Right;  . Injection knee Left 03/04/2013    Procedure: left KNEE cortisone INJECTION;  Surgeon: Gearlean Alf, MD;  Location: [facility];  Service: Orthopedics;  Laterality: Left;  .  Cardiac catheterization    . Aortic valve replacement N/A 09/24/2013    Procedure: AORTIC VALVE REPLACEMENT (AVR) using Pericardial Tissue Valve (size 67mm).;  Surgeon: Gaye Pollack, MD;  Location: [facility];  Service: Open Heart Surgery;  Laterality: N/A;  . Coronary artery bypass graft N/A 09/24/2013    Procedure: CORONARY ARTERY BYPASS GRAFTING (CABG) x4: LIMA to LAD, SVG to Diagonal 1, SVG to Diagonal 2, Svg to OM 1.;  Surgeon: Gaye Pollack, MD;  Location: [facility];  Service: Open Heart Surgery;  Laterality: N/A;  . Intraoperative transesophageal echocardiogram N/A 09/24/2013    Procedure: INTRAOPERATIVE TRANSESOPHAGEAL ECHOCARDIOGRAM;  Surgeon: Gaye Pollack, MD;  Location: [facility];  Service: Open Heart Surgery;  Laterality: N/A;    History   Social History    . Marital Status: Married    Spouse Name: N/A    Number of Children: N/A  . Years of Education: N/A   Occupational History  . Not on file.   Social History Main Topics  . Smoking status: Never Smoker   . Smokeless tobacco: Never Used  . Alcohol Use: 0.6 oz/week    1 Glasses of wine per week     Comment: 4 oz nightly red wine  . Drug Use: No  . Sexual Activity: Not on file   Other Topics Concern  . Not on file   Social History Narrative  . No narrative on file    ROS: no fevers or chills, productive cough, hemoptysis, dysphasia, odynophagia, melena, hematochezia, dysuria, hematuria, rash, seizure activity, orthopnea, PND, pedal edema, claudication. Remaining systems are negative.  Physical Exam: Well-developed well-nourished in no acute distress.  Skin is warm and dry.  HEENT is normal.  Neck is supple.  Chest is clear to auscultation with normal expansion.  Cardiovascular exam is regular rate and rhythm. 2/6 systolic murmur left sternal border. No diastolic murmur. Abdominal exam nontender or distended. No masses palpated. Extremities show no edema. neuro grossly intact  ECG Sinus rhythm, lateral T-wave inversion.

## 2013-12-12 NOTE — Assessment & Plan Note (Signed)
Her blood pressure occasionally runs low. She does have mild elevation in heart rate in the morning before taking medications. Discontinue metoprolol. Treated with Toprol 25 mg daily and follow.

## 2013-12-13 ENCOUNTER — Encounter (HOSPITAL_COMMUNITY)
Admission: RE | Admit: 2013-12-13 | Discharge: 2013-12-13 | Disposition: A | Discharge status: Home / Self Care | Payer: Medicare Other | Source: Ambulatory Visit | Attending: Cardiology | Admitting: Cardiology

## 2013-12-13 DIAGNOSIS — Z951 Presence of aortocoronary bypass graft: Secondary | ICD-10-CM | POA: Diagnosis not present

## 2013-12-16 ENCOUNTER — Encounter (HOSPITAL_COMMUNITY)
Admission: RE | Admit: 2013-12-16 | Discharge: 2013-12-16 | Disposition: A | Discharge status: Home / Self Care | Payer: Medicare Other | Source: Ambulatory Visit | Attending: Cardiology | Admitting: Cardiology

## 2013-12-16 DIAGNOSIS — Z951 Presence of aortocoronary bypass graft: Secondary | ICD-10-CM | POA: Diagnosis not present

## 2013-12-18 ENCOUNTER — Encounter (HOSPITAL_COMMUNITY)
Admission: RE | Admit: 2013-12-18 | Discharge: 2013-12-18 | Disposition: A | Discharge status: Home / Self Care | Payer: Medicare Other | Source: Ambulatory Visit | Attending: Cardiology | Admitting: Cardiology

## 2013-12-18 DIAGNOSIS — Z951 Presence of aortocoronary bypass graft: Secondary | ICD-10-CM | POA: Diagnosis not present

## 2013-12-20 ENCOUNTER — Encounter (HOSPITAL_COMMUNITY)
Admission: RE | Admit: 2013-12-20 | Discharge: 2013-12-20 | Disposition: A | Discharge status: Home / Self Care | Payer: Medicare Other | Source: Ambulatory Visit | Attending: Cardiology | Admitting: Cardiology

## 2013-12-20 DIAGNOSIS — Z951 Presence of aortocoronary bypass graft: Secondary | ICD-10-CM | POA: Diagnosis not present

## 2013-12-20 NOTE — Progress Notes (Signed)
Linda Parker 78 y.o. female Nutrition Note Spoke with pt.  Nutrition Survey reviewed with pt. Pt is following Step 2 of the Therapeutic Lifestyle Changes diet. Pt expressed understanding of the information reviewed. Pt aware of nutrition education classes offered.  Nutrition Diagnosis   Food-and nutrition-related knowledge deficit related to lack of exposure to information as related to diagnosis of: ? CVD   Nutrition Intervention   Benefits of adopting Therapeutic Lifestyle Changes discussed when Medficts reviewed.   Pt to attend the Portion Distortion class   Pt to attend the  ? Nutrition I class - met 11/19/13                    ? Nutrition II class   Continue client-centered nutrition education by RD, as part of interdisciplinary care.  Goal(s)   Pt to describe the benefit of including fruits, vegetables, whole grains, and low-fat dairy products in a heart healthy meal plan.  Monitor and Evaluate progress toward nutrition goal with team. Nutrition Risk:  Low   Derek Mound, M.Ed, RD, LDN, CDE 12/20/2013 2:04 PM

## 2013-12-23 ENCOUNTER — Encounter (HOSPITAL_COMMUNITY)
Admission: RE | Admit: 2013-12-23 | Discharge: 2013-12-23 | Disposition: A | Discharge status: Home / Self Care | Payer: Medicare Other | Source: Ambulatory Visit | Attending: Cardiology | Admitting: Cardiology

## 2013-12-23 DIAGNOSIS — Z951 Presence of aortocoronary bypass graft: Secondary | ICD-10-CM | POA: Diagnosis not present

## 2013-12-24 ENCOUNTER — Telehealth: Payer: Self-pay | Admitting: Cardiology

## 2013-12-24 NOTE — Telephone Encounter (Signed)
Pt called in stating that can not tolerate Lipitor . She said she is having servere pain in all of her joints. She is having difficulty sleeping because of the pain in her joints. She would like to know if she can take 25 mg of Zocor(which she had been previously prescribed, she didn't have any problems when she was taking this mediciatons. Please call  Thanks

## 2013-12-25 ENCOUNTER — Encounter (HOSPITAL_COMMUNITY)
Admission: RE | Admit: 2013-12-25 | Discharge: 2013-12-25 | Disposition: A | Discharge status: Home / Self Care | Payer: Medicare Other | Source: Ambulatory Visit | Attending: Cardiology | Admitting: Cardiology

## 2013-12-25 DIAGNOSIS — Z951 Presence of aortocoronary bypass graft: Secondary | ICD-10-CM | POA: Diagnosis present

## 2013-12-25 DIAGNOSIS — Z954 Presence of other heart-valve replacement: Secondary | ICD-10-CM | POA: Insufficient documentation

## 2013-12-27 ENCOUNTER — Encounter (HOSPITAL_COMMUNITY): Payer: Medicare Other

## 2013-12-30 ENCOUNTER — Encounter (HOSPITAL_COMMUNITY): Payer: Medicare Other

## 2014-01-01 ENCOUNTER — Encounter (HOSPITAL_COMMUNITY)
Admission: RE | Admit: 2014-01-01 | Discharge: 2014-01-01 | Disposition: A | Discharge status: Home / Self Care | Payer: Medicare Other | Source: Ambulatory Visit | Attending: Cardiology | Admitting: Cardiology

## 2014-01-01 DIAGNOSIS — Z951 Presence of aortocoronary bypass graft: Secondary | ICD-10-CM | POA: Diagnosis not present

## 2014-01-01 NOTE — Telephone Encounter (Signed)
Dc lipitor and resume zocor 20 mg daily with lipids and liver in six weeks. [name]

## 2014-01-01 NOTE — Telephone Encounter (Signed)
Returned call to patient she stated she cannot take lipitor 40 mg.Stated it causes joint pain.Stated she don't remember why zocor was stopped,but she did not have joint pain with zocor.Message sent to [facility] for advice.

## 2014-01-01 NOTE — Telephone Encounter (Signed)
Pt is calling back to find out if there is another medication she can take in the place of Lipitor since it has been giving her some problems. Please call  Thanks

## 2014-01-02 ENCOUNTER — Telehealth: Payer: Self-pay | Admitting: Cardiology

## 2014-01-02 NOTE — Telephone Encounter (Signed)
Linda Parker is calling because she is wanting to know should she get a new prescription for the Zocor because she takes 10 mg instead of 20 and if so can one be sent to her Pharmacy : [place]. Also she needs a prescription for Amoxicillin before she goes to the dentist next week. Please call if you have any questions .Marland Kitchen   Thanks

## 2014-01-03 ENCOUNTER — Encounter (HOSPITAL_COMMUNITY): Payer: Medicare Other

## 2014-01-06 ENCOUNTER — Encounter (HOSPITAL_COMMUNITY): Payer: Medicare Other

## 2014-01-06 MED ORDER — SIMVASTATIN 20 MG PO TABS: 20.0000 mg | ORAL_TABLET | Freq: Every day | ORAL | Status: DC

## 2014-01-06 NOTE — Telephone Encounter (Signed)
Spoke with pt, Aware of dr crenshaw's recommendations.  °

## 2014-01-06 NOTE — Addendum Note (Signed)
Addended by: Cristopher Estimable on: 01/06/2014 10:24 AM   Modules accepted: Orders

## 2014-01-06 NOTE — Telephone Encounter (Signed)
Returned call to patient no answer.LMTC. 

## 2014-01-07 ENCOUNTER — Telehealth: Payer: Self-pay

## 2014-01-07 MED ORDER — AMOXICILLIN 500 MG PO CAPS: ORAL_CAPSULE | ORAL | Status: DC

## 2014-01-07 NOTE — Telephone Encounter (Signed)
Received call from patient she stated she needed Amoxicillin for dental work called to [facility].Prescription sent to pharmacy.

## 2014-01-08 ENCOUNTER — Encounter (HOSPITAL_COMMUNITY)
Admission: RE | Admit: 2014-01-08 | Discharge: 2014-01-08 | Disposition: A | Discharge status: Home / Self Care | Payer: Medicare Other | Source: Ambulatory Visit | Attending: Cardiology | Admitting: Cardiology

## 2014-01-08 DIAGNOSIS — Z951 Presence of aortocoronary bypass graft: Secondary | ICD-10-CM | POA: Diagnosis not present

## 2014-01-10 ENCOUNTER — Encounter (HOSPITAL_COMMUNITY)
Admission: RE | Admit: 2014-01-10 | Discharge: 2014-01-10 | Disposition: A | Discharge status: Home / Self Care | Payer: Medicare Other | Source: Ambulatory Visit | Attending: Cardiology | Admitting: Cardiology

## 2014-01-10 DIAGNOSIS — Z951 Presence of aortocoronary bypass graft: Secondary | ICD-10-CM | POA: Diagnosis not present

## 2014-01-13 ENCOUNTER — Encounter (HOSPITAL_COMMUNITY)
Admission: RE | Admit: 2014-01-13 | Discharge: 2014-01-13 | Disposition: A | Discharge status: Home / Self Care | Payer: Medicare Other | Source: Ambulatory Visit | Attending: Cardiology | Admitting: Cardiology

## 2014-01-13 DIAGNOSIS — Z951 Presence of aortocoronary bypass graft: Secondary | ICD-10-CM | POA: Diagnosis not present

## 2014-01-15 ENCOUNTER — Encounter (HOSPITAL_COMMUNITY)
Admission: RE | Admit: 2014-01-15 | Discharge: 2014-01-15 | Disposition: A | Discharge status: Home / Self Care | Payer: Medicare Other | Source: Ambulatory Visit | Attending: Cardiology | Admitting: Cardiology

## 2014-01-15 DIAGNOSIS — Z951 Presence of aortocoronary bypass graft: Secondary | ICD-10-CM | POA: Diagnosis not present

## 2014-01-17 ENCOUNTER — Encounter (HOSPITAL_COMMUNITY): Payer: Medicare Other

## 2014-01-20 ENCOUNTER — Telehealth: Payer: Self-pay | Admitting: Cardiology

## 2014-01-20 ENCOUNTER — Encounter (HOSPITAL_COMMUNITY)
Admission: RE | Admit: 2014-01-20 | Discharge: 2014-01-20 | Disposition: A | Discharge status: Home / Self Care | Payer: Medicare Other | Source: Ambulatory Visit | Attending: Cardiology | Admitting: Cardiology

## 2014-01-20 DIAGNOSIS — Z951 Presence of aortocoronary bypass graft: Secondary | ICD-10-CM | POA: Diagnosis not present

## 2014-01-20 NOTE — Telephone Encounter (Signed)
Spoke with pt, questions regarding lab work answered. °

## 2014-01-20 NOTE — Telephone Encounter (Signed)
Pt called in stating that she would like for Debra to call her to help her with scheduling some test to check her liver and muscle enzymes due to her taking a statin drug(Zocor). Please call  Thanks

## 2014-01-22 ENCOUNTER — Encounter (HOSPITAL_COMMUNITY)
Admission: RE | Admit: 2014-01-22 | Discharge: 2014-01-22 | Disposition: A | Discharge status: Home / Self Care | Payer: Medicare Other | Source: Ambulatory Visit | Attending: Cardiology | Admitting: Cardiology

## 2014-01-22 DIAGNOSIS — Z951 Presence of aortocoronary bypass graft: Secondary | ICD-10-CM | POA: Diagnosis not present

## 2014-01-23 LAB — HEPATIC FUNCTION PANEL
ALT: 15 U/L (ref 0–35)
AST: 19 U/L (ref 0–37)
Albumin: 4.2 g/dL (ref 3.5–5.2)
Alkaline Phosphatase: 74 U/L (ref 39–117)
BILIRUBIN DIRECT: 0.1 mg/dL (ref 0.0–0.3)
BILIRUBIN INDIRECT: 0.3 mg/dL (ref 0.2–1.2)
Total Bilirubin: 0.4 mg/dL (ref 0.2–1.2)
Total Protein: 6.7 g/dL (ref 6.0–8.3)

## 2014-01-23 LAB — LIPID PANEL
CHOL/HDL RATIO: 2.2 ratio
Cholesterol: 142 mg/dL (ref 0–200)
HDL: 66 mg/dL (ref >39)
LDL Cholesterol: 63 mg/dL (ref 0–99)
Triglycerides: 63 mg/dL (ref <150)
VLDL: 13 mg/dL (ref 0–40)

## 2014-01-23 LAB — CK: CK TOTAL: 50 U/L (ref 7–177)

## 2014-01-24 ENCOUNTER — Encounter: Payer: Self-pay | Admitting: *Deleted

## 2014-01-24 ENCOUNTER — Encounter (HOSPITAL_COMMUNITY)
Admission: RE | Admit: 2014-01-24 | Discharge: 2014-01-24 | Disposition: A | Discharge status: Home / Self Care | Payer: Medicare Other | Source: Ambulatory Visit | Attending: Cardiology | Admitting: Cardiology

## 2014-01-24 DIAGNOSIS — Z954 Presence of other heart-valve replacement: Secondary | ICD-10-CM | POA: Diagnosis present

## 2014-01-24 DIAGNOSIS — Z951 Presence of aortocoronary bypass graft: Secondary | ICD-10-CM | POA: Insufficient documentation

## 2014-01-27 ENCOUNTER — Encounter (HOSPITAL_COMMUNITY)
Admission: RE | Admit: 2014-01-27 | Discharge: 2014-01-27 | Disposition: A | Discharge status: Home / Self Care | Payer: Medicare Other | Source: Ambulatory Visit | Attending: Cardiology | Admitting: Cardiology

## 2014-01-27 DIAGNOSIS — Z951 Presence of aortocoronary bypass graft: Secondary | ICD-10-CM | POA: Diagnosis not present

## 2014-01-29 ENCOUNTER — Encounter (HOSPITAL_COMMUNITY): Payer: Medicare Other

## 2014-01-31 ENCOUNTER — Encounter (HOSPITAL_COMMUNITY): Payer: Medicare Other

## 2014-02-03 ENCOUNTER — Encounter (HOSPITAL_COMMUNITY)
Admission: RE | Admit: 2014-02-03 | Discharge: 2014-02-03 | Disposition: A | Discharge status: Home / Self Care | Payer: Medicare Other | Source: Ambulatory Visit | Attending: Cardiology | Admitting: Cardiology

## 2014-02-03 DIAGNOSIS — Z951 Presence of aortocoronary bypass graft: Secondary | ICD-10-CM | POA: Diagnosis not present

## 2014-02-05 ENCOUNTER — Encounter (HOSPITAL_COMMUNITY): Payer: Medicare Other

## 2014-02-07 ENCOUNTER — Encounter (HOSPITAL_COMMUNITY)
Admission: RE | Admit: 2014-02-07 | Discharge: 2014-02-07 | Disposition: A | Discharge status: Home / Self Care | Payer: Medicare Other | Source: Ambulatory Visit | Attending: Cardiology | Admitting: Cardiology

## 2014-02-07 DIAGNOSIS — Z951 Presence of aortocoronary bypass graft: Secondary | ICD-10-CM | POA: Diagnosis not present

## 2014-02-10 ENCOUNTER — Encounter (HOSPITAL_COMMUNITY): Payer: Medicare Other

## 2014-02-12 ENCOUNTER — Encounter (HOSPITAL_COMMUNITY)
Admission: RE | Admit: 2014-02-12 | Discharge: 2014-02-12 | Disposition: A | Discharge status: Home / Self Care | Payer: Medicare Other | Source: Ambulatory Visit | Attending: Cardiology | Admitting: Cardiology

## 2014-02-12 DIAGNOSIS — Z951 Presence of aortocoronary bypass graft: Secondary | ICD-10-CM | POA: Diagnosis not present

## 2014-02-14 ENCOUNTER — Encounter (HOSPITAL_COMMUNITY): Payer: Medicare Other

## 2014-02-17 ENCOUNTER — Encounter (HOSPITAL_COMMUNITY)
Admission: RE | Admit: 2014-02-17 | Discharge: 2014-02-17 | Disposition: A | Discharge status: Home / Self Care | Payer: Medicare Other | Source: Ambulatory Visit | Attending: Cardiology | Admitting: Cardiology

## 2014-02-17 DIAGNOSIS — Z951 Presence of aortocoronary bypass graft: Secondary | ICD-10-CM | POA: Diagnosis not present

## 2014-02-19 ENCOUNTER — Encounter (HOSPITAL_COMMUNITY)
Admission: RE | Admit: 2014-02-19 | Discharge: 2014-02-19 | Disposition: A | Discharge status: Home / Self Care | Payer: Medicare Other | Source: Ambulatory Visit | Attending: Cardiology | Admitting: Cardiology

## 2014-02-19 DIAGNOSIS — Z951 Presence of aortocoronary bypass graft: Secondary | ICD-10-CM | POA: Diagnosis not present

## 2014-02-21 ENCOUNTER — Encounter (HOSPITAL_COMMUNITY)
Admission: RE | Admit: 2014-02-21 | Discharge: 2014-02-21 | Disposition: A | Discharge status: Home / Self Care | Payer: Medicare Other | Source: Ambulatory Visit | Attending: Cardiology | Admitting: Cardiology

## 2014-02-21 DIAGNOSIS — Z951 Presence of aortocoronary bypass graft: Secondary | ICD-10-CM | POA: Diagnosis not present

## 2014-02-24 ENCOUNTER — Encounter (HOSPITAL_COMMUNITY)
Admission: RE | Admit: 2014-02-24 | Discharge: 2014-02-24 | Disposition: A | Discharge status: Home / Self Care | Payer: Medicare Other | Source: Ambulatory Visit | Attending: Cardiology | Admitting: Cardiology

## 2014-02-24 DIAGNOSIS — Z954 Presence of other heart-valve replacement: Secondary | ICD-10-CM | POA: Insufficient documentation

## 2014-02-24 DIAGNOSIS — Z951 Presence of aortocoronary bypass graft: Secondary | ICD-10-CM | POA: Diagnosis present

## 2014-02-26 ENCOUNTER — Encounter (HOSPITAL_COMMUNITY): Payer: Self-pay

## 2014-02-26 ENCOUNTER — Encounter (HOSPITAL_COMMUNITY)
Admission: RE | Admit: 2014-02-26 | Discharge: 2014-02-26 | Disposition: A | Discharge status: Home / Self Care | Payer: Medicare Other | Source: Ambulatory Visit | Attending: Cardiology | Admitting: Cardiology

## 2014-02-26 DIAGNOSIS — Z951 Presence of aortocoronary bypass graft: Secondary | ICD-10-CM | POA: Diagnosis not present

## 2014-02-26 NOTE — Progress Notes (Signed)
Pt graduated from cardiac rehab program today with completion of 36 exercise sessions in Phase II within 16 weeks.  Pt absences were related to previously scheduled travel.  pt attended 2/13 scheduled education classes.   Pt exercise ability somewhat limited by incisional leg pain.  Pt feels she has not adequately met her goals. Pt exercise limitations that have decreased her ability to adequately meet her goals have been reviewed with Dr. Stanford Breed and Dr. Lawson Fiscal.  Both  agreed pt may benefit from additional phase II cardiac rehabilitation  for continued  progressive workload increase by exercise physiologist, continued education and  telemetry monitoring.   Medication list reconciled. Pt husband prepares pt medications daily, however pt able to verbalize understanding of her medication regimen. Repeat  PHQ2 score- 0.  Pt outlook is more hopeful with good coping skills and very supportive family.  Pt will need continued medical reinforcement, including physician encouragement to make lifestyle changes.

## 2014-02-28 ENCOUNTER — Encounter (HOSPITAL_COMMUNITY): Payer: Medicare Other

## 2014-02-28 ENCOUNTER — Telehealth: Payer: Self-pay | Admitting: Cardiology

## 2014-02-28 NOTE — Telephone Encounter (Signed)
Request for additional cardiac rehab visits beyond initial 36 visits faxed to [facility] 02-28-14

## 2014-03-03 ENCOUNTER — Encounter: Payer: Self-pay | Admitting: Cardiology

## 2014-03-03 ENCOUNTER — Encounter (HOSPITAL_COMMUNITY): Payer: Medicare Other

## 2014-03-05 ENCOUNTER — Encounter (HOSPITAL_COMMUNITY): Payer: Medicare Other

## 2014-03-07 ENCOUNTER — Encounter (HOSPITAL_COMMUNITY): Payer: Medicare Other

## 2014-03-10 ENCOUNTER — Encounter (HOSPITAL_COMMUNITY): Payer: Medicare Other

## 2014-03-12 ENCOUNTER — Encounter (HOSPITAL_COMMUNITY): Payer: Medicare Other

## 2014-03-14 ENCOUNTER — Encounter (HOSPITAL_COMMUNITY): Payer: Medicare Other

## 2014-03-17 ENCOUNTER — Telehealth: Payer: Self-pay | Admitting: Cardiology

## 2014-03-17 NOTE — Telephone Encounter (Signed)
Spoke with pt, she is having trouble with swelling in her legs at the end of the day prior to going bed. Her skin gets tight and she will have a sock ring. It will be completely gone in the morning. She denies SOB. Encouraged the patient to get some support hose to waer during the day to help with venous insuff. Patient has also stopped taking the zocor due to joint pain and theose pains are better. Okay given for patient to stay off the zocor. She will try the support hose and call if no improvement.

## 2014-03-17 NOTE — Telephone Encounter (Signed)
Ms. Tregre is calling because she is having some slight swelling in her legs towards the end of the day . Please call    Thanks

## 2014-03-19 ENCOUNTER — Telehealth (HOSPITAL_COMMUNITY): Payer: Self-pay | Admitting: Cardiac Rehabilitation

## 2014-03-19 NOTE — Telephone Encounter (Signed)
-----   Message from Lelon Perla, MD sent at 03/18/2014  5:05 PM EST ----- Regarding: RE: [place] bc ----- Message -----    From: Lowell Guitar, RN    Sent: 03/18/2014   3:33 PM      To: Lelon Perla, MD Subject: Cardiac Rehab                                  Dear Dr. Stanford Breed,  Pt is scheduled to continue cardiac rehab.  We would like to incorporate strength training with focus on hamstring/leg extension activities.  Is it appropriate for pt to use weights at cardiac rehab?   Thank you, Andi Hence, RN, BSN Cardiac Pulmonary Rehab

## 2014-03-27 ENCOUNTER — Encounter (HOSPITAL_COMMUNITY)
Admission: RE | Admit: 2014-03-27 | Discharge: 2014-03-27 | Disposition: A | Discharge status: Home / Self Care | Payer: Medicare Other | Source: Ambulatory Visit | Attending: Cardiology | Admitting: Cardiology

## 2014-03-27 DIAGNOSIS — Z954 Presence of other heart-valve replacement: Secondary | ICD-10-CM | POA: Insufficient documentation

## 2014-03-27 DIAGNOSIS — Z951 Presence of aortocoronary bypass graft: Secondary | ICD-10-CM | POA: Insufficient documentation

## 2014-03-27 NOTE — Progress Notes (Signed)
Cardiac Rehab Medication Review by a Pharmacist  Does the patient  feel that his/her medications are working for him/her?  yes  Has the patient been experiencing any side effects to the medications prescribed?  Yes - joint pain/muscle cramping with Zocor. Not taking anymore.  Does the patient measure his/her own blood pressure or blood glucose at home?  yes   Does the patient have any problems obtaining medications due to transportation or finances?   no  Understanding of regimen: excellent Understanding of indications: good Potential of compliance: excellent   Pharmacist comments: Patient is experiencing muscle cramps and leg pain with Zocor. Patient measures her own blood pressure at home and has no barriers to obtaining her medications.  Patient understands her medication regimen and is very compliant with her medications.   Cassie L. Nicole Kindred, PharmD Clinical Pharmacy Resident Pager: [phone] 03/27/2014 8:43 AM

## 2014-03-31 ENCOUNTER — Encounter (HOSPITAL_COMMUNITY)
Admission: RE | Admit: 2014-03-31 | Discharge: 2014-03-31 | Disposition: A | Discharge status: Home / Self Care | Payer: Medicare Other | Source: Ambulatory Visit | Attending: Cardiology | Admitting: Cardiology

## 2014-03-31 DIAGNOSIS — Z954 Presence of other heart-valve replacement: Secondary | ICD-10-CM | POA: Diagnosis present

## 2014-03-31 DIAGNOSIS — Z951 Presence of aortocoronary bypass graft: Secondary | ICD-10-CM | POA: Diagnosis present

## 2014-03-31 NOTE — Progress Notes (Signed)
Pt started cardiac rehab today.  Pt tolerated light exercise without difficulty.  VSS, telemetry-sinus rhythm.  Asymptomatic.  PHQ-0. Quality of life slightly altered by her physical limitations and family health concerns.  However pt has good coping skills, positive outlooks and very loving and supportive spouse.   Pt goals for rehab are to increase stamina and  ability to walk longer distances with  improved lower extremity tolerance and function.  Pt oriented to exercise equipment and routine.  Understanding verbalized.

## 2014-04-02 ENCOUNTER — Encounter (HOSPITAL_COMMUNITY)
Admission: RE | Admit: 2014-04-02 | Discharge: 2014-04-02 | Disposition: A | Discharge status: Home / Self Care | Payer: Medicare Other | Source: Ambulatory Visit | Attending: Cardiology | Admitting: Cardiology

## 2014-04-02 DIAGNOSIS — Z951 Presence of aortocoronary bypass graft: Secondary | ICD-10-CM | POA: Diagnosis not present

## 2014-04-03 ENCOUNTER — Encounter (HOSPITAL_COMMUNITY): Payer: Self-pay | Admitting: Cardiovascular Disease

## 2014-04-04 ENCOUNTER — Encounter (HOSPITAL_COMMUNITY)
Admission: RE | Admit: 2014-04-04 | Discharge: 2014-04-04 | Disposition: A | Discharge status: Home / Self Care | Payer: Medicare Other | Source: Ambulatory Visit | Attending: Cardiology | Admitting: Cardiology

## 2014-04-04 DIAGNOSIS — Z951 Presence of aortocoronary bypass graft: Secondary | ICD-10-CM | POA: Diagnosis not present

## 2014-04-07 ENCOUNTER — Encounter (HOSPITAL_COMMUNITY)
Admission: RE | Admit: 2014-04-07 | Discharge: 2014-04-07 | Disposition: A | Discharge status: Home / Self Care | Payer: Medicare Other | Source: Ambulatory Visit | Attending: Cardiology | Admitting: Cardiology

## 2014-04-07 DIAGNOSIS — Z951 Presence of aortocoronary bypass graft: Secondary | ICD-10-CM | POA: Diagnosis not present

## 2014-04-07 NOTE — Progress Notes (Signed)
Pt reports she has been walking 1 mile at home daily 15 min twice daily.  Pt c/o continued difficulty with leg strength when sitting in low seat.  Pt instructed to do leg squats and leg extension exercises as instructed.  Understanding verbalized

## 2014-04-09 ENCOUNTER — Encounter (HOSPITAL_COMMUNITY): Payer: Medicare Other

## 2014-04-11 ENCOUNTER — Encounter (HOSPITAL_COMMUNITY)
Admission: RE | Admit: 2014-04-11 | Discharge: 2014-04-11 | Disposition: A | Discharge status: Home / Self Care | Payer: Medicare Other | Source: Ambulatory Visit | Attending: Cardiology | Admitting: Cardiology

## 2014-04-11 DIAGNOSIS — Z951 Presence of aortocoronary bypass graft: Secondary | ICD-10-CM | POA: Diagnosis not present

## 2014-04-11 NOTE — Progress Notes (Signed)
I have reviewed home exercise with [facility]. The patient was advised to walk 2-4 days per week outside of CRP II for  30-45 minutes continuously.  Pt will also complete one additional day of hand weights, chair stands and squats.  Progression of exercise prescription was discussed.  Reviewed THR, pulse, RPE, sign and symptoms and when to call 911 or MD.  Pt voiced understanding.  Archie Endo, MS, ACSM RCEP 04/11/2014 3:26 PM

## 2014-04-14 ENCOUNTER — Encounter (HOSPITAL_COMMUNITY)
Admission: RE | Admit: 2014-04-14 | Discharge: 2014-04-14 | Disposition: A | Discharge status: Home / Self Care | Payer: Medicare Other | Source: Ambulatory Visit | Attending: Cardiology | Admitting: Cardiology

## 2014-04-14 DIAGNOSIS — Z951 Presence of aortocoronary bypass graft: Secondary | ICD-10-CM | POA: Diagnosis not present

## 2014-04-16 ENCOUNTER — Encounter (HOSPITAL_COMMUNITY)
Admission: RE | Admit: 2014-04-16 | Discharge: 2014-04-16 | Disposition: A | Discharge status: Home / Self Care | Payer: Medicare Other | Source: Ambulatory Visit | Attending: Cardiology | Admitting: Cardiology

## 2014-04-16 DIAGNOSIS — Z951 Presence of aortocoronary bypass graft: Secondary | ICD-10-CM | POA: Diagnosis not present

## 2014-04-18 ENCOUNTER — Encounter (HOSPITAL_COMMUNITY): Payer: Medicare Other

## 2014-04-21 ENCOUNTER — Encounter (HOSPITAL_COMMUNITY)
Admission: RE | Admit: 2014-04-21 | Discharge: 2014-04-21 | Disposition: A | Discharge status: Home / Self Care | Payer: Medicare Other | Source: Ambulatory Visit | Attending: Cardiology | Admitting: Cardiology

## 2014-04-21 DIAGNOSIS — Z951 Presence of aortocoronary bypass graft: Secondary | ICD-10-CM | POA: Diagnosis not present

## 2014-04-23 ENCOUNTER — Encounter (HOSPITAL_COMMUNITY)
Admission: RE | Admit: 2014-04-23 | Discharge: 2014-04-23 | Disposition: A | Discharge status: Home / Self Care | Payer: Medicare Other | Source: Ambulatory Visit | Attending: Cardiology | Admitting: Cardiology

## 2014-04-23 DIAGNOSIS — Z951 Presence of aortocoronary bypass graft: Secondary | ICD-10-CM | POA: Diagnosis not present

## 2014-04-25 ENCOUNTER — Encounter (HOSPITAL_COMMUNITY): Payer: Medicare Other

## 2014-04-28 ENCOUNTER — Encounter (HOSPITAL_COMMUNITY)
Admission: RE | Admit: 2014-04-28 | Discharge: 2014-04-28 | Disposition: A | Discharge status: Home / Self Care | Payer: Medicare Other | Source: Ambulatory Visit | Attending: Cardiology | Admitting: Cardiology

## 2014-04-28 DIAGNOSIS — Z951 Presence of aortocoronary bypass graft: Secondary | ICD-10-CM | POA: Diagnosis not present

## 2014-04-28 DIAGNOSIS — Z954 Presence of other heart-valve replacement: Secondary | ICD-10-CM | POA: Insufficient documentation

## 2014-04-28 NOTE — Progress Notes (Signed)
Pt reports she has noticed increased lower leg strength in home activities. Pt is able to rise up and down from seated position with less difficulty and has more confidence in her leg strength. Pt continues to notice lower extremity weakness however feels it is improving with her increased efforts.  Pt reports she is doing squats and leg lift exercises at home in addition to walking.  Will continue to monitor and encourage patient.

## 2014-04-30 ENCOUNTER — Encounter (HOSPITAL_COMMUNITY)
Admission: RE | Admit: 2014-04-30 | Discharge: 2014-04-30 | Disposition: A | Discharge status: Home / Self Care | Payer: Medicare Other | Source: Ambulatory Visit | Attending: Cardiology | Admitting: Cardiology

## 2014-04-30 DIAGNOSIS — Z951 Presence of aortocoronary bypass graft: Secondary | ICD-10-CM | POA: Diagnosis not present

## 2014-05-02 ENCOUNTER — Encounter (HOSPITAL_COMMUNITY): Payer: Medicare Other

## 2014-05-02 ENCOUNTER — Telehealth (HOSPITAL_COMMUNITY): Payer: Self-pay | Admitting: Internal Medicine

## 2014-05-05 ENCOUNTER — Encounter (HOSPITAL_COMMUNITY)
Admission: RE | Admit: 2014-05-05 | Discharge: 2014-05-05 | Disposition: A | Discharge status: Home / Self Care | Payer: Medicare Other | Source: Ambulatory Visit | Attending: Cardiology | Admitting: Cardiology

## 2014-05-05 DIAGNOSIS — Z951 Presence of aortocoronary bypass graft: Secondary | ICD-10-CM | POA: Diagnosis not present

## 2014-05-07 ENCOUNTER — Encounter (HOSPITAL_COMMUNITY)
Admission: RE | Admit: 2014-05-07 | Discharge: 2014-05-07 | Disposition: A | Discharge status: Home / Self Care | Payer: Medicare Other | Source: Ambulatory Visit | Attending: Cardiology | Admitting: Cardiology

## 2014-05-07 DIAGNOSIS — Z951 Presence of aortocoronary bypass graft: Secondary | ICD-10-CM | POA: Diagnosis not present

## 2014-05-09 ENCOUNTER — Encounter (HOSPITAL_COMMUNITY)
Admission: RE | Admit: 2014-05-09 | Discharge: 2014-05-09 | Disposition: A | Discharge status: Home / Self Care | Payer: Medicare Other | Source: Ambulatory Visit | Attending: Cardiology | Admitting: Cardiology

## 2014-05-09 DIAGNOSIS — Z951 Presence of aortocoronary bypass graft: Secondary | ICD-10-CM | POA: Diagnosis not present

## 2014-05-12 ENCOUNTER — Encounter (HOSPITAL_COMMUNITY): Payer: Medicare Other

## 2014-05-14 ENCOUNTER — Encounter (HOSPITAL_COMMUNITY)
Admission: RE | Admit: 2014-05-14 | Discharge: 2014-05-14 | Disposition: A | Discharge status: Home / Self Care | Payer: Medicare Other | Source: Ambulatory Visit | Attending: Cardiology | Admitting: Cardiology

## 2014-05-14 DIAGNOSIS — Z951 Presence of aortocoronary bypass graft: Secondary | ICD-10-CM | POA: Diagnosis not present

## 2014-05-16 ENCOUNTER — Encounter (HOSPITAL_COMMUNITY): Payer: Medicare Other

## 2014-05-19 ENCOUNTER — Encounter (HOSPITAL_COMMUNITY)
Admission: RE | Admit: 2014-05-19 | Discharge: 2014-05-19 | Disposition: A | Discharge status: Home / Self Care | Payer: Medicare Other | Source: Ambulatory Visit | Attending: Cardiology | Admitting: Cardiology

## 2014-05-19 DIAGNOSIS — Z951 Presence of aortocoronary bypass graft: Secondary | ICD-10-CM | POA: Diagnosis not present

## 2014-05-21 ENCOUNTER — Encounter (HOSPITAL_COMMUNITY)
Admission: RE | Admit: 2014-05-21 | Discharge: 2014-05-21 | Disposition: A | Discharge status: Home / Self Care | Payer: Medicare Other | Source: Ambulatory Visit | Attending: Cardiology | Admitting: Cardiology

## 2014-05-21 DIAGNOSIS — Z951 Presence of aortocoronary bypass graft: Secondary | ICD-10-CM | POA: Diagnosis not present

## 2014-05-23 ENCOUNTER — Encounter (HOSPITAL_COMMUNITY)
Admission: RE | Admit: 2014-05-23 | Discharge: 2014-05-23 | Disposition: A | Discharge status: Home / Self Care | Payer: Medicare Other | Source: Ambulatory Visit | Attending: Cardiology | Admitting: Cardiology

## 2014-05-23 DIAGNOSIS — Z951 Presence of aortocoronary bypass graft: Secondary | ICD-10-CM | POA: Diagnosis not present

## 2014-05-26 ENCOUNTER — Encounter (HOSPITAL_COMMUNITY)
Admission: RE | Admit: 2014-05-26 | Discharge: 2014-05-26 | Disposition: A | Discharge status: Home / Self Care | Payer: Medicare Other | Source: Ambulatory Visit | Attending: Cardiology | Admitting: Cardiology

## 2014-05-26 DIAGNOSIS — Z954 Presence of other heart-valve replacement: Secondary | ICD-10-CM | POA: Diagnosis present

## 2014-05-26 DIAGNOSIS — Z951 Presence of aortocoronary bypass graft: Secondary | ICD-10-CM | POA: Diagnosis not present

## 2014-05-28 ENCOUNTER — Encounter (HOSPITAL_COMMUNITY)
Admission: RE | Admit: 2014-05-28 | Discharge: 2014-05-28 | Disposition: A | Discharge status: Home / Self Care | Payer: Medicare Other | Source: Ambulatory Visit | Attending: Cardiology | Admitting: Cardiology

## 2014-05-28 DIAGNOSIS — Z951 Presence of aortocoronary bypass graft: Secondary | ICD-10-CM | POA: Diagnosis not present

## 2014-05-28 NOTE — Progress Notes (Addendum)
Pt encouraged by her increased physical abilities and energy level.  Pt reports ankle injury at home yesterday which somewhat limits her weight lifting activities, otherwise pt is tolerating her cardiac rehab activities without difficulty.  Pt verbalizes more confidence with her abilities at rehab and at home and is enjoying activity.   Pt is joyful and playful at rehab with more positive demeanor, dress and attitude.  Pt now states her ultimate goal would be to participate in a 5k or 1/2 marathon.

## 2014-05-30 ENCOUNTER — Encounter (HOSPITAL_COMMUNITY)
Admission: RE | Admit: 2014-05-30 | Discharge: 2014-05-30 | Disposition: A | Discharge status: Home / Self Care | Payer: Medicare Other | Source: Ambulatory Visit | Attending: Cardiology | Admitting: Cardiology

## 2014-05-30 DIAGNOSIS — Z951 Presence of aortocoronary bypass graft: Secondary | ICD-10-CM | POA: Diagnosis not present

## 2014-06-02 ENCOUNTER — Encounter (HOSPITAL_COMMUNITY)
Admission: RE | Admit: 2014-06-02 | Discharge: 2014-06-02 | Disposition: A | Discharge status: Home / Self Care | Payer: Medicare Other | Source: Ambulatory Visit | Attending: Cardiology | Admitting: Cardiology

## 2014-06-02 DIAGNOSIS — Z951 Presence of aortocoronary bypass graft: Secondary | ICD-10-CM | POA: Diagnosis not present

## 2014-06-04 ENCOUNTER — Encounter (HOSPITAL_COMMUNITY): Payer: Medicare Other

## 2014-06-06 ENCOUNTER — Encounter (HOSPITAL_COMMUNITY)
Admission: RE | Admit: 2014-06-06 | Discharge: 2014-06-06 | Disposition: A | Discharge status: Home / Self Care | Payer: Medicare Other | Source: Ambulatory Visit | Attending: Cardiology | Admitting: Cardiology

## 2014-06-06 DIAGNOSIS — Z951 Presence of aortocoronary bypass graft: Secondary | ICD-10-CM | POA: Diagnosis not present

## 2014-06-09 ENCOUNTER — Encounter (HOSPITAL_COMMUNITY)
Admission: RE | Admit: 2014-06-09 | Discharge: 2014-06-09 | Disposition: A | Discharge status: Home / Self Care | Payer: Medicare Other | Source: Ambulatory Visit | Attending: Cardiology | Admitting: Cardiology

## 2014-06-09 DIAGNOSIS — Z951 Presence of aortocoronary bypass graft: Secondary | ICD-10-CM | POA: Diagnosis not present

## 2014-06-09 NOTE — Progress Notes (Signed)
Pt arrived at cardiac rehab reporting she has started amoxicillin for her URI.  Pt reports improvement of her symptoms.  Med list reconciled.

## 2014-06-11 ENCOUNTER — Encounter (HOSPITAL_COMMUNITY)
Admission: RE | Admit: 2014-06-11 | Discharge: 2014-06-11 | Disposition: A | Discharge status: Home / Self Care | Payer: Medicare Other | Source: Ambulatory Visit | Attending: Cardiology | Admitting: Cardiology

## 2014-06-11 DIAGNOSIS — Z951 Presence of aortocoronary bypass graft: Secondary | ICD-10-CM | POA: Diagnosis not present

## 2014-06-12 NOTE — Progress Notes (Signed)
HPI: FU AVR and CAD. Echocardiogram in May 2015 demonstrated severe aortic stenosis. She was symptomatic and referred for cardiac catheterization. This demonstrated 3 vessel CAD. She was referred for CABG and AVR. She was admitted 6/15 and underwent CABG (LIMA-LAD, SVG-D1, SVG-D2, SVG-OM1) and bioprosthetic AVR (23 mm Edwards magna-ease pericardial valve). Note preoperative carotid Dopplers may 2015 showed bilateral 1-39% stenosis. Postoperative echocardiogram showed normal LV function, grade 1 diastolic dysfunction, bioprosthetic aortic valve with mean gradient 10 mm of mercury. Trace aortic insufficiency. Moderate tricuspid regurgitation. Since last seen, the patient denies any dyspnea on exertion, orthopnea, PND, pedal edema, palpitations, syncope or chest pain.   Current Outpatient Prescriptions  Medication Sig Dispense Refill  . amoxicillin (AMOXIL) 500 MG capsule Take 2 grams  (  4 capsules )  1 hour before dental work. 4 capsule 1  . Ascorbic Acid (VITAMIN C) POWD Take by mouth. Taking 1/4 teaspoon daily    . aspirin 81 MG tablet Take 81 mg by mouth daily.    . Calcium-Magnesium 500-250 MG TABS Take 1 tablet by mouth daily.    . cholecalciferol (VITAMIN D) 1000 UNITS tablet Take 2,000 Units by mouth daily.     Marland Kitchen CINNAMON PO Take by mouth. Takes 1/4 teaspoon daily    . co-enzyme Q-10 50 MG capsule Take 100 mg by mouth daily.     Marland Kitchen estradiol (ESTRACE) 0.5 MG tablet Take 0.5 mg by mouth daily before breakfast.     . folic acid (FOLVITE) 962 MCG tablet Take 400 mcg by mouth daily.    . Glucosamine-Chondroitin (GLUCOSAMINE CHONDR COMPLEX PO) Take 1 tablet by mouth 2 (two) times daily.    Marland Kitchen ibuprofen (ADVIL,MOTRIN) 200 MG tablet Take 200 mg by mouth every 6 (six) hours as needed for headache or mild pain.    Marland Kitchen levothyroxine (SYNTHROID, LEVOTHROID) 50 MCG tablet Take 50 mcg by mouth daily before breakfast.    . metoprolol succinate (TOPROL XL) 25 MG 24 hr tablet Take 1 tablet (25 mg total)  by mouth daily. 90 tablet 3  . Misc Natural Products (TART CHERRY ADVANCED PO) Take 1,000 mg by mouth.    . Multiple Vitamins-Minerals (VITA-MIN PO) Take 75 mg by mouth daily.     Marland Kitchen omega-3 acid ethyl esters (LOVAZA) 1 G capsule Take 1 g by mouth 2 (two) times daily.    . vitamin E 400 UNIT capsule Take 400 Units by mouth daily.     No current facility-administered medications for this visit.     Past Medical History  Diagnosis Date  . Unspecified hypothyroidism   . Aortic stenosis   . Palpitations   . Dysrhythmia     when taking antihistamines  . Heart murmur   . Arthritis     hands & knee- L   . History of echocardiogram     Echo (8/15): EF 55-60%, normal wall motion, grade 1 diastolic dysfunction, AVR okay (mean 10 mm Hg), normal RV function, moderate TR    Past Surgical History  Procedure Laterality Date  . Finger surgery      left hand  . Bilateral knee surgery  35 yrs. ago    cartilage removed from knees  . Abdominal hysterectomy    . Tonsillectomy    . Appendectomy      at time of hysterectomy  . Total knee arthroplasty Right 03/04/2013    Procedure: RIGHT TOTAL KNEE ARTHROPLASTY;  Surgeon: Gearlean Alf, MD;  Location: [facility];  Service: Orthopedics;  Laterality: Right;  . Injection knee Left 03/04/2013    Procedure: left KNEE cortisone INJECTION;  Surgeon: Gearlean Alf, MD;  Location: [facility];  Service: Orthopedics;  Laterality: Left;  . Cardiac catheterization    . Aortic valve replacement N/A 09/24/2013    Procedure: AORTIC VALVE REPLACEMENT (AVR) using Pericardial Tissue Valve (size 62mm).;  Surgeon: Gaye Pollack, MD;  Location: [place] [facility];  Service: Open Heart Surgery;  Laterality: N/A;  . Coronary artery bypass graft N/A 09/24/2013    Procedure: CORONARY ARTERY BYPASS GRAFTING (CABG) x4: LIMA to LAD, SVG to Diagonal 1, SVG to Diagonal 2, Svg to OM 1.;  Surgeon: Gaye Pollack, MD;  Location: [facility];  Service: Open Heart Surgery;  Laterality: N/A;  . Intraoperative  transesophageal echocardiogram N/A 09/24/2013    Procedure: INTRAOPERATIVE TRANSESOPHAGEAL ECHOCARDIOGRAM;  Surgeon: Gaye Pollack, MD;  Location: [facility];  Service: Open Heart Surgery;  Laterality: N/A;  . Left and right heart catheterization with coronary angiogram N/A 09/17/2013    Procedure: LEFT AND RIGHT HEART CATHETERIZATION WITH CORONARY ANGIOGRAM;  Surgeon: Blane Ohara, MD;  Location: [facility] CATH LAB;  Service: Cardiovascular;  Laterality: N/A;    History   Social History  . Marital Status: Married    Spouse Name: N/A  . Number of Children: N/A  . Years of Education: N/A   Occupational History  . Not on file.   Social History Main Topics  . Smoking status: Never Smoker   . Smokeless tobacco: Never Used  . Alcohol Use: 0.6 oz/week    1 Glasses of wine per week     Comment: 4 oz nightly red wine  . Drug Use: No  . Sexual Activity: Not on file   Other Topics Concern  . Not on file   Social History Narrative    ROS: arthralgias but no fevers or chills, productive cough, hemoptysis, dysphasia, odynophagia, melena, hematochezia, dysuria, hematuria, rash, seizure activity, orthopnea, PND, pedal edema, claudication. Remaining systems are negative.  Physical Exam: Well-developed well-nourished in no acute distress.  Skin is warm and dry.  HEENT is normal.  Neck is supple.  Chest is clear to auscultation with normal expansion.  Cardiovascular exam is regular rate and rhythm. 2/6 systolic murmur left sternal border. No diastolic murmur. Abdominal exam nontender or distended. No masses palpated. Extremities show no edema. neuro grossly intact  ECG sinus rhythm at a rate of 77. RV conduction delay. No significant ST changes.

## 2014-06-13 ENCOUNTER — Ambulatory Visit (INDEPENDENT_AMBULATORY_CARE_PROVIDER_SITE_OTHER): Payer: Medicare Other | Admitting: Cardiology

## 2014-06-13 ENCOUNTER — Encounter (HOSPITAL_COMMUNITY)
Admission: RE | Admit: 2014-06-13 | Discharge: 2014-06-13 | Disposition: A | Discharge status: Home / Self Care | Payer: Medicare Other | Source: Ambulatory Visit | Attending: Cardiology | Admitting: Cardiology

## 2014-06-13 ENCOUNTER — Encounter: Payer: Self-pay | Admitting: Cardiology

## 2014-06-13 VITALS — BP 100/50 | HR 77 | Ht 63.75 in | Wt 124.3 lb

## 2014-06-13 DIAGNOSIS — Z951 Presence of aortocoronary bypass graft: Secondary | ICD-10-CM | POA: Diagnosis not present

## 2014-06-13 DIAGNOSIS — I2583 Coronary atherosclerosis due to lipid rich plaque: Secondary | ICD-10-CM

## 2014-06-13 DIAGNOSIS — I2510 Atherosclerotic heart disease of native coronary artery without angina pectoris: Secondary | ICD-10-CM

## 2014-06-13 DIAGNOSIS — R002 Palpitations: Secondary | ICD-10-CM

## 2014-06-13 DIAGNOSIS — I359 Nonrheumatic aortic valve disorder, unspecified: Secondary | ICD-10-CM

## 2014-06-13 NOTE — Assessment & Plan Note (Signed)
-   Continue Toprol 

## 2014-06-13 NOTE — Patient Instructions (Signed)
Your physician wants you to follow-up in: ONE YEAR WITH DR CRENSHAW You will receive a reminder letter in the mail two months in advance. If you don't receive a letter, please call our office to schedule the follow-up appointment.  

## 2014-06-13 NOTE — Addendum Note (Signed)
Addended by: Cristopher Estimable on: 06/13/2014 09:29 AM   Modules accepted: Medications

## 2014-06-13 NOTE — Assessment & Plan Note (Addendum)
Continue aspirin and statin. She is on low-dose Zocor. She did not tolerate Lipitor.

## 2014-06-13 NOTE — Assessment & Plan Note (Signed)
Status post aortic valve replacement. Continue SBE prophylaxis. 

## 2014-06-16 ENCOUNTER — Encounter (HOSPITAL_COMMUNITY)
Admission: RE | Admit: 2014-06-16 | Discharge: 2014-06-16 | Disposition: A | Discharge status: Home / Self Care | Payer: Medicare Other | Source: Ambulatory Visit | Attending: Cardiology | Admitting: Cardiology

## 2014-06-16 DIAGNOSIS — Z951 Presence of aortocoronary bypass graft: Secondary | ICD-10-CM | POA: Diagnosis not present

## 2014-06-18 ENCOUNTER — Encounter (HOSPITAL_COMMUNITY)
Admission: RE | Admit: 2014-06-18 | Discharge: 2014-06-18 | Disposition: A | Discharge status: Home / Self Care | Payer: Medicare Other | Source: Ambulatory Visit | Attending: Cardiology | Admitting: Cardiology

## 2014-06-18 DIAGNOSIS — Z951 Presence of aortocoronary bypass graft: Secondary | ICD-10-CM | POA: Diagnosis not present

## 2014-06-20 ENCOUNTER — Encounter (HOSPITAL_COMMUNITY)
Admission: RE | Admit: 2014-06-20 | Discharge: 2014-06-20 | Disposition: A | Discharge status: Home / Self Care | Payer: Medicare Other | Source: Ambulatory Visit | Attending: Cardiology | Admitting: Cardiology

## 2014-06-20 DIAGNOSIS — Z951 Presence of aortocoronary bypass graft: Secondary | ICD-10-CM | POA: Diagnosis not present

## 2014-06-23 ENCOUNTER — Encounter (HOSPITAL_COMMUNITY)
Admission: RE | Admit: 2014-06-23 | Discharge: 2014-06-23 | Disposition: A | Discharge status: Home / Self Care | Payer: Medicare Other | Source: Ambulatory Visit | Attending: Cardiology | Admitting: Cardiology

## 2014-06-23 DIAGNOSIS — Z951 Presence of aortocoronary bypass graft: Secondary | ICD-10-CM | POA: Diagnosis not present

## 2014-06-25 ENCOUNTER — Encounter (HOSPITAL_COMMUNITY)
Admission: RE | Admit: 2014-06-25 | Discharge: 2014-06-25 | Disposition: A | Discharge status: Home / Self Care | Payer: Medicare Other | Source: Ambulatory Visit | Attending: Cardiology | Admitting: Cardiology

## 2014-06-25 DIAGNOSIS — Z954 Presence of other heart-valve replacement: Secondary | ICD-10-CM | POA: Insufficient documentation

## 2014-06-25 DIAGNOSIS — Z951 Presence of aortocoronary bypass graft: Secondary | ICD-10-CM | POA: Diagnosis present

## 2014-06-27 ENCOUNTER — Encounter (HOSPITAL_COMMUNITY)
Admission: RE | Admit: 2014-06-27 | Discharge: 2014-06-27 | Disposition: A | Discharge status: Home / Self Care | Payer: Medicare Other | Source: Ambulatory Visit | Attending: Cardiology | Admitting: Cardiology

## 2014-06-27 ENCOUNTER — Encounter (HOSPITAL_COMMUNITY): Payer: Self-pay

## 2014-06-27 DIAGNOSIS — Z951 Presence of aortocoronary bypass graft: Secondary | ICD-10-CM | POA: Diagnosis not present

## 2014-06-27 NOTE — Progress Notes (Addendum)
Pt graduated from cardiac rehab program today with completion of 32 exercise sessions in Phase II. Pt maintained excellent  attendance and progressed nicely during her  participation in rehab as evidenced by increased MET level.   Medication list reconciled. Repeat  PHQ score- 0 .  Pt has made significant lifestyle changes and should be commended for her success. Pt feels she has achieved her goals during cardiac rehab, especially increased strength, stamina and increased knee flexability with decreased knee pain.  Pt reports she is easily able to sit down and rise up from seated position without difficulty which was impossible for her prior to this period of cardiac rehab. Pt easily able to climb stairs and walk longer distances. However pt reports she is unable to walk much more than 15 minutes without dyspnea.   Pt attitude and demeanor has greatly improved with lighter mood, more smiles, sassier dress and positive statements about her abilities and capabilities.   Pt plans to continue exercising on her own walking at home.  Again, pt should be commended for her success.

## 2014-06-30 ENCOUNTER — Encounter (HOSPITAL_COMMUNITY): Payer: Medicare Other

## 2014-07-02 ENCOUNTER — Encounter (HOSPITAL_COMMUNITY): Payer: Medicare Other

## 2014-07-04 ENCOUNTER — Encounter (HOSPITAL_COMMUNITY): Payer: Medicare Other

## 2014-07-17 ENCOUNTER — Encounter: Payer: Self-pay | Admitting: Cardiology

## 2014-07-28 ENCOUNTER — Telehealth: Payer: Self-pay | Admitting: Cardiology

## 2014-07-28 NOTE — Telephone Encounter (Signed)
Spoke with pt, she is having trouble with joint pain and is going to stop the simvastatin for a couple weeks to see if that will help. Okay given to patient to take naproxyn at bedtime if needed for leg pain.

## 2014-07-28 NOTE — Telephone Encounter (Signed)
Pt called in wanting to speak with Hilda Blades about a new statin drug she was just prescribed.   Thanks

## 2014-09-23 ENCOUNTER — Telehealth: Payer: Self-pay | Admitting: Cardiology

## 2014-09-23 NOTE — Telephone Encounter (Signed)
Linda Parker is calling because she had a temporary heaviness in her left arm . No Dizziness , no sweating , no shortness of breath , it went away after 43min .Marland Kitchen Please call    Thanks

## 2014-09-23 NOTE — Telephone Encounter (Signed)
No further evaluation at this point Linda Parker

## 2014-09-23 NOTE — Telephone Encounter (Signed)
Pt called back - gave recommendation - she voiced understanding.

## 2014-09-23 NOTE — Telephone Encounter (Signed)
Pt states she had arm weakness/"heaviness" while driving last Thursday or Friday. No other symptoms.  She notes she may have been gripping wheel too hard. She hung her arm by her side for 1-2 minutes, got better. Notes no problems since.  Advised likely MSK in absence of other problems. She voiced understanding. Notes her husband has OV this afternoon at 4, she will be with him, wanted to see if Dr. Stanford Breed had any concerns.

## 2014-09-23 NOTE — Telephone Encounter (Signed)
Attempted to return call to pt, picked up w/ no answer.

## 2014-11-12 ENCOUNTER — Telehealth: Payer: Self-pay | Admitting: Cardiology

## 2014-11-12 NOTE — Telephone Encounter (Signed)
Linda Parker is calling because  Dr. Osborne Casco wants her to start Lovastatin and she has some questions . Please call   Thanks

## 2014-11-12 NOTE — Telephone Encounter (Signed)
Spoke with patient who reports Dr. Osborne Casco wants to start her on lovastatin 40mg  daily. She reports she has tried lipitor 40mg  for 5 days and had knee, ankle, hip, shoulder pains so bad they woke up her at nite and affected her quality of life. She reports she has been on simvastatin 40mg  b/c of same symptoms. She reports her LDL is 102 and her goal is under 70; her HDL is 62. She states she will try lovastatin 40mg  for 30 days. She reports she read the SE of this statin and it did not list joint pain but listed other things such as memory loss, confusion, stomach upset, etc.   Patient requested that MD be notified of this med change.  Med list updated.  Will route to Dr. Stanford Breed & Hilda Blades, RN as Juluis Rainier

## 2014-11-25 ENCOUNTER — Other Ambulatory Visit: Payer: Self-pay | Admitting: Cardiology

## 2015-02-26 ENCOUNTER — Ambulatory Visit (HOSPITAL_COMMUNITY): Payer: Medicare Other

## 2015-03-26 ENCOUNTER — Telehealth: Payer: Self-pay | Admitting: Cardiology

## 2015-03-26 NOTE — Telephone Encounter (Signed)
Pt notes she takes metoprolol in evening.  2 times w/in past 1-2 weeks, she has noted lightheadedness ~30 mins after taking. She checked fitbit last night after this and noted HR of 52-53. She was concerned about this so walked to bring rate up. Lightheadedness resolved, rate came up to 66-68.  She wanted to know if she could cut metoprolol dose. She has not had any palpitations ~3 months.  Did state HR itself not concerning but since she was having lightheadedness to check BPs and see if low. She is on vacation at [facility] and does not have BP cuff. Returns home Saturday.  Advised probably OK to reduce dose, would confirm w/ Dr. Stanford Breed.

## 2015-03-26 NOTE — Telephone Encounter (Signed)
Decrease toprol to 12.5 mg daily Linda Parker

## 2015-03-26 NOTE — Telephone Encounter (Signed)
Recommendations relayed to patient, she voiced understanding.

## 2015-03-26 NOTE — Telephone Encounter (Signed)
Mrs.Deal is calling because she is on Toprolol and she has been feeling light headed and her heart rate dropped to 53.Marland Kitchen Please call   Thanks

## 2015-03-27 ENCOUNTER — Telehealth: Payer: Self-pay | Admitting: Cardiology

## 2015-03-27 NOTE — Telephone Encounter (Signed)
Discussed with dr Stanford Breed, pt given the okay to stop the metoprolol. Made aware her palpitations may increase off the toprol. She will monitor her bp.

## 2015-03-27 NOTE — Telephone Encounter (Signed)
Patient wants to know if she can stop taking the Metoprolol.  She just does not like how it makes her feel.

## 2015-03-27 NOTE — Telephone Encounter (Signed)
Spoke with pt, she is wearing a fit bit and she is has noticed for the last 2 weeks her heart rate is getting into the 50's  She cut the dose in 1/2 and her pulse is now running 60-70. She got lightheaded the other night and her pulse was 52. She is also feeling fatigue when she usually has a lot of energy. She wants to stop the toprol. Will discuss with dr Stanford Breed

## 2015-04-15 ENCOUNTER — Telehealth: Payer: Self-pay | Admitting: Cardiology

## 2015-04-15 NOTE — Telephone Encounter (Signed)
Returned call to patient.She stated she would like appointment with Dr.Crenshaw.Stated she is concerned her heart rate is up and down,ranging 52 to 91 bpm.Stated normally her heart rate 60 to 70 bpm. Stated when heart beat is in the 50's she gets weak,dizzy,light headed.Stated she has spoke to [place] and was told she could stop metoprolol.She had to restart metoprolol 25 mg 1/2 tablet daily one week ago.Advised to avoid caffeine.Stated she will see a PA and then wants to see Dr.Crenshaw.Appointment scheduled with Rosaria Ferries PA 04/21/15 at 8:00 am.Appointment scheduled with Dr.Crenshaw 05/05/15 at 8:15 am.

## 2015-04-15 NOTE — Telephone Encounter (Signed)
Pt called in stating that for the past 3 wks her heart rate has been fluctuating and she is concerned. She would like to a nurse about this. Please f/u with her.  Thanks

## 2015-04-21 ENCOUNTER — Ambulatory Visit (INDEPENDENT_AMBULATORY_CARE_PROVIDER_SITE_OTHER): Payer: Medicare Other | Admitting: Physician Assistant

## 2015-04-21 ENCOUNTER — Encounter: Payer: Self-pay | Admitting: Physician Assistant

## 2015-04-21 VITALS — BP 106/66 | HR 76 | Ht 63.0 in | Wt 122.3 lb

## 2015-04-21 DIAGNOSIS — Z954 Presence of other heart-valve replacement: Secondary | ICD-10-CM

## 2015-04-21 DIAGNOSIS — R002 Palpitations: Secondary | ICD-10-CM | POA: Diagnosis not present

## 2015-04-21 DIAGNOSIS — I951 Orthostatic hypotension: Secondary | ICD-10-CM

## 2015-04-21 DIAGNOSIS — Z953 Presence of xenogenic heart valve: Secondary | ICD-10-CM | POA: Insufficient documentation

## 2015-04-21 NOTE — Progress Notes (Signed)
Cardiology Office Note   Date:  04/21/2015   ID:  Linda Parker, DOB 01/10/1936, MRN [id number]  PCP:  Linda Pao, MD  Cardiologist:  Dr Alcide Evener, PA-C   Chief Complaint  Patient presents with  . Palpitations  . Shortness of Breath    comes and goes    History of Present Illness: Linda Parker is a 79 y.o. female with a history of hypothyroid, palpitations, AS, CABG x 4 w/ AVR 2015  Linda Parker presents for evaluation of variability in heart rate and blood pressure as well as occasional lightheaded feelings.  [name] had some episodes of feeling lightheaded that was associated with pericardia, a heart rate of 52. After the second time this happened, she called [place] and we had her decrease her metoprolol XL to a half tablet daily and then she stopped taking it for a couple of days because she was still having some symptoms.  However, she felt worse off of it and felt that her blood pressure and heart rate were running too high. She started back on one half tablet daily and is taken that ever since. She has a sheet with blood pressure and heart rates recorded. After she cut back to one half tablet daily she had no heart rates recorded less than 60. She has no heart rates recorded greater than 90. Her systolic blood pressure. Spent between 98 and 132. She admits that the days where her blood pressure was low, she felt a little lightheaded. She also admits that she had not been drinking enough water that day.  Since being back on one half tablet of her beta blocker, she has felt better. She is generally active and walks when the weather is warm and a half. She admits that she has been very busy over the holidays and has been tired from this.  She recently quit taking calcium because she was concerned that the calcium supplements were building up in her arteries. She is compliant with her medications including her Synthroid which is followed by Dr.  Osborne Parker.   Past Medical History  Diagnosis Date  . Unspecified hypothyroidism   . Aortic stenosis     s/p AVR w/ 23 mm Edward Magna-ease Pericardial Valve  . Palpitations   . Dysrhythmia     when taking antihistamines  . Heart murmur   . Arthritis     hands & knee- L   . History of echocardiogram     Echo (8/15): EF 55-60%, normal wall motion, grade 1 diastolic dysfunction, AVR okay (mean 10 mm Hg), normal RV function, moderate TR    Past Surgical History  Procedure Laterality Date  . Finger surgery      left hand  . Bilateral knee surgery  35 yrs. ago    cartilage removed from knees  . Abdominal hysterectomy    . Tonsillectomy    . Appendectomy      at time of hysterectomy  . Total knee arthroplasty Right 03/04/2013    Procedure: RIGHT TOTAL KNEE ARTHROPLASTY;  Surgeon: Gearlean Alf, MD;  Location: [facility];  Service: Orthopedics;  Laterality: Right;  . Injection knee Left 03/04/2013    Procedure: left KNEE cortisone INJECTION;  Surgeon: Gearlean Alf, MD;  Location: [facility];  Service: Orthopedics;  Laterality: Left;  . Cardiac catheterization    . Aortic valve replacement N/A 09/24/2013    Procedure: AORTIC VALVE REPLACEMENT (AVR) using Pericardial Tissue Valve (size  77mm).;  Surgeon: Gaye Pollack, MD;  Location: [facility];  Service: Open Heart Surgery;  Laterality: N/A;  . Coronary artery bypass graft N/A 09/24/2013    Procedure: CORONARY ARTERY BYPASS GRAFTING (CABG) x4: LIMA to LAD, SVG to Diagonal 1, SVG to Diagonal 2, Svg to OM 1.;  Surgeon: Gaye Pollack, MD;  Location: [facility];  Service: Open Heart Surgery;  Laterality: N/A;  . Intraoperative transesophageal echocardiogram N/A 09/24/2013    Procedure: INTRAOPERATIVE TRANSESOPHAGEAL ECHOCARDIOGRAM;  Surgeon: Gaye Pollack, MD;  Location: [facility] OR;  Service: Open Heart Surgery;  Laterality: N/A;  . Left and right heart catheterization with coronary angiogram N/A 09/17/2013    Procedure: LEFT AND RIGHT HEART CATHETERIZATION WITH  CORONARY ANGIOGRAM;  Surgeon: Blane Ohara, MD;  Location: [facility] CATH LAB;  Service: Cardiovascular;  Laterality: N/A;    Current Outpatient Prescriptions  Medication Sig Dispense Refill  . acetaminophen (TYLENOL) 500 MG tablet Take 500 mg by mouth every 6 (six) hours as needed (takes qhs PRN).    Marland Kitchen amoxicillin (AMOXIL) 500 MG capsule Take 2 grams  (  4 capsules )  1 hour before dental work. 4 capsule 1  . Ascorbic Acid (VITAMIN C) POWD Take by mouth. Taking 1/4 teaspoon daily    . aspirin 81 MG tablet Take 81 mg by mouth daily.    . cholecalciferol (VITAMIN D) 1000 UNITS tablet Take 2,000 Units by mouth daily.     Marland Kitchen CINNAMON PO Take by mouth. Takes 1/4 teaspoon daily    . estradiol (ESTRACE) 0.5 MG tablet Take 0.5 mg by mouth daily before breakfast.     . folic acid (FOLVITE) [id number] MCG tablet Take 400 mcg by mouth daily.    . Glucosamine-Chondroitin (GLUCOSAMINE CHONDR COMPLEX PO) Take 1 tablet by mouth 2 (two) times daily.    Marland Kitchen ibuprofen (ADVIL,MOTRIN) 200 MG tablet Take 200 mg by mouth every 6 (six) hours as needed for headache or mild pain.    Marland Kitchen levothyroxine (SYNTHROID, LEVOTHROID) 50 MCG tablet Take 50 mcg by mouth daily before breakfast.    . lovastatin (MEVACOR) 40 MG tablet Take 40 mg by mouth at bedtime. Per Dr. Osborne Parker    . metoprolol succinate (TOPROL-XL) 25 MG 24 hr tablet Take 12.5 mg by mouth daily.    . Misc Natural Products (TART CHERRY ADVANCED PO) Take 1,000 mg by mouth.    . Multiple Vitamins-Minerals (VITA-MIN PO) Take 75 mg by mouth daily.     Marland Kitchen omega-3 acid ethyl esters (LOVAZA) 1 G capsule Take 1 g by mouth 2 (two) times daily.    . vitamin E 400 UNIT capsule Take 400 Units by mouth daily.     No current facility-administered medications for this visit.    Allergies:   Erythromycin; Monosodium glutamate; Nitrates, organic; and Sulfonamide derivatives    Social History:  The patient  reports that she has never smoked. She has never used smokeless tobacco. She  reports that she drinks about 0.6 oz of alcohol per week. She reports that she does not use illicit drugs.   Family History:  The patient's family history is not on file.    ROS:  Please see the history of present illness. All other systems are reviewed and negative.    PHYSICAL EXAM: VS:  BP 106/66 mmHg  Pulse 76  Ht 5\' 3"  (1.6 m)  Wt 122 lb 4.8 oz (55.475 kg)  BMI 21.67 kg/m2 , BMI Body mass index is 21.67 kg/(m^2). GEN:  Well nourished, well developed, female in no acute distress HEENT: normal for age  Neck: no JVD, no carotid bruit, no masses Cardiac: RRR; 2/6 systolic murmur, no rubs, or gallops Respiratory:  clear to auscultation bilaterally, normal work of breathing GI: soft, nontender, nondistended, + BS MS: no deformity or atrophy; no edema; distal pulses are 2+ in all 4 extremities  Skin: warm and dry, no rash Neuro:  Strength and sensation are intact Psych: euthymic mood, full affect   EKG:  EKG is ordered today. The ekg ordered today demonstrates sinus rhythm, no acute ischemic changes and intervals are within normal limits.   Recent Labs: No results found for requested labs within last 365 days.    Lipid Panel    Component Value Date/Time   CHOL 142 01/23/2014 0822   TRIG 63 01/23/2014 0822   HDL 66 01/23/2014 0822   CHOLHDL 2.2 01/23/2014 0822   VLDL 13 01/23/2014 0822   LDLCALC 63 01/23/2014 0822     Wt Readings from Last 3 Encounters:  04/21/15 122 lb 4.8 oz (55.475 kg)  06/13/14 124 lb 4.8 oz (56.382 kg)  03/27/14 123 lb 14.4 oz (56.2 kg)     Other studies Reviewed: Additional studies/ records that were reviewed today include: Hospital notes and office records.  ASSESSMENT AND PLAN:  1.  Palpitations: She is not having any significant palpitations. Heart rates recorded are all within normal limits. She is now wearing a Ecologist which will help track her heart rate. If she has additional symptoms, she will let us know. She is to continue the beta  blocker one half tablet daily.  2. Lightheaded feeling, hypotension: Her symptoms were orthostatic in nature. She admits that her fluid intake may have been down a bit during that period of time. She is encouraged to stay well-hydrated and let us know if she has any additional symptoms.  3. CAD: She is not having any ischemic symptoms  4. Aortic valve replacement: She has not had an echocardiogram since August 2015. We will get another one and she will follow-up with Dr. Stanford Breed after.   Current medicines are reviewed at length with the patient today.  The patient does not have concerns regarding medicines.  The following changes have been made:  no change  Labs/ tests ordered today include:    echocardiogram   Disposition:   FU with Dr. Stanford Breed  Signed, Lenoard Aden  04/21/2015 8:09 AM    [address], [place], [place]  [address] Phone: [phone]; Fax: [phone]

## 2015-04-21 NOTE — Patient Instructions (Addendum)
Medication Instructions:  Your physician recommends that you continue on your current medications as directed. Please refer to the Current Medication list given to you today.  Labwork: NONE  Testing/Procedures: Your physician has requested that you have an echocardiogram before the end of the year if possible. Echocardiography is a painless test that uses sound waves to create images of your heart. It provides your doctor with information about the size and shape of your heart and how well your heart's chambers and valves are working. This procedure takes approximately one hour. There are no restrictions for this procedure.   Follow-Up: Your physician recommends that you keep your already scheduled follow-up appointment.  If you need a refill on your cardiac medications before your next appointment, please call your pharmacy.

## 2015-05-01 ENCOUNTER — Other Ambulatory Visit: Payer: Self-pay

## 2015-05-01 ENCOUNTER — Ambulatory Visit (HOSPITAL_COMMUNITY): Payer: Medicare Other | Attending: Physician Assistant

## 2015-05-01 DIAGNOSIS — M1990 Unspecified osteoarthritis, unspecified site: Secondary | ICD-10-CM | POA: Insufficient documentation

## 2015-05-01 DIAGNOSIS — E039 Hypothyroidism, unspecified: Secondary | ICD-10-CM | POA: Diagnosis not present

## 2015-05-01 DIAGNOSIS — I059 Rheumatic mitral valve disease, unspecified: Secondary | ICD-10-CM | POA: Diagnosis not present

## 2015-05-01 DIAGNOSIS — R002 Palpitations: Secondary | ICD-10-CM | POA: Diagnosis not present

## 2015-05-01 DIAGNOSIS — I517 Cardiomegaly: Secondary | ICD-10-CM | POA: Diagnosis not present

## 2015-05-01 DIAGNOSIS — Z954 Presence of other heart-valve replacement: Secondary | ICD-10-CM | POA: Diagnosis not present

## 2015-05-04 NOTE — Progress Notes (Signed)
HPI: FU AVR and CAD. Echocardiogram in May 2015 demonstrated severe aortic stenosis. She was symptomatic and referred for cardiac catheterization. This demonstrated 3 vessel CAD. She was referred for CABG and AVR. She was admitted 6/15 and underwent CABG (LIMA-LAD, SVG-D1, SVG-D2, SVG-OM1) and bioprosthetic AVR (23 mm Edwards magna-ease pericardial valve). Note preoperative carotid Dopplers May 2015 showed bilateral 1-39% stenosis. Last echocardiogram January 2017 showed normal LV function, grade 1 diastolic dysfunction, bioprosthetic aortic valve with mean gradient 9 mmHg and moderate left atrial enlargement. Since last seen, the patient has dyspnea with more extreme activities but not with routine activities. It is relieved with rest. It is not associated with chest pain. There is no orthopnea, PND or pedal edema. There is no syncope or palpitations. There is no exertional chest pain.   Current Outpatient Prescriptions  Medication Sig Dispense Refill  . acetaminophen (TYLENOL) 500 MG tablet Take 500 mg by mouth every 6 (six) hours as needed (takes qhs PRN).    Marland Kitchen amoxicillin (AMOXIL) 500 MG capsule Take 2 grams  (  4 capsules )  1 hour before dental work. 4 capsule 1  . Ascorbic Acid (VITAMIN C) POWD Take by mouth. Taking 1/4 teaspoon daily    . aspirin 81 MG tablet Take 81 mg by mouth daily.    . cholecalciferol (VITAMIN D) 1000 UNITS tablet Take 2,000 Units by mouth daily.     Marland Kitchen CINNAMON PO Take by mouth. Takes 1/4 teaspoon daily    . estradiol (ESTRACE) 0.5 MG tablet Take 0.5 mg by mouth daily before breakfast.     . folic acid (FOLVITE) Q000111Q MCG tablet Take 400 mcg by mouth daily.    . Glucosamine-Chondroitin (GLUCOSAMINE CHONDR COMPLEX PO) Take 1 tablet by mouth 2 (two) times daily.    Marland Kitchen ibuprofen (ADVIL,MOTRIN) 200 MG tablet Take 200 mg by mouth every 6 (six) hours as needed for headache or mild pain.    Marland Kitchen levothyroxine (SYNTHROID, LEVOTHROID) 50 MCG tablet Take 50 mcg by mouth daily  before breakfast.    . lovastatin (MEVACOR) 40 MG tablet Take 40 mg by mouth at bedtime. Per Dr. Osborne Casco    . metoprolol succinate (TOPROL-XL) 25 MG 24 hr tablet Take 12.5 mg by mouth daily.    . Misc Natural Products (TART CHERRY ADVANCED PO) Take 1,000 mg by mouth.    . Multiple Vitamins-Minerals (VITA-MIN PO) Take 75 mg by mouth daily.     Marland Kitchen omega-3 acid ethyl esters (LOVAZA) 1 G capsule Take 1 g by mouth 2 (two) times daily.    . vitamin E 400 UNIT capsule Take 400 Units by mouth daily.     No current facility-administered medications for this visit.     Past Medical History  Diagnosis Date  . Unspecified hypothyroidism   . Aortic stenosis     s/p AVR w/ 23 mm Edward Magna-ease Pericardial Valve  . Palpitations   . Dysrhythmia     when taking antihistamines  . Heart murmur   . Arthritis     hands & knee- L   . History of echocardiogram     Echo (8/15): EF 55-60%, normal wall motion, grade 1 diastolic dysfunction, AVR okay (mean 10 mm Hg), normal RV function, moderate TR    Past Surgical History  Procedure Laterality Date  . Finger surgery      left hand  . Bilateral knee surgery  35 yrs. ago    cartilage removed from knees  .  Abdominal hysterectomy    . Tonsillectomy    . Appendectomy      at time of hysterectomy  . Total knee arthroplasty Right 03/04/2013    Procedure: RIGHT TOTAL KNEE ARTHROPLASTY;  Surgeon: Gearlean Alf, MD;  Location: [facility];  Service: Orthopedics;  Laterality: Right;  . Injection knee Left 03/04/2013    Procedure: left KNEE cortisone INJECTION;  Surgeon: Gearlean Alf, MD;  Location: [facility];  Service: Orthopedics;  Laterality: Left;  . Cardiac catheterization    . Aortic valve replacement N/A 09/24/2013    Procedure: AORTIC VALVE REPLACEMENT (AVR) using Pericardial Tissue Valve (size 50mm).;  Surgeon: Gaye Pollack, MD;  Location: [place] OR;  Service: Open Heart Surgery;  Laterality: N/A;  . Coronary artery bypass graft N/A 09/24/2013    Procedure:  CORONARY ARTERY BYPASS GRAFTING (CABG) x4: LIMA to LAD, SVG to Diagonal 1, SVG to Diagonal 2, Svg to OM 1.;  Surgeon: Gaye Pollack, MD;  Location: [facility] OR;  Service: Open Heart Surgery;  Laterality: N/A;  . Intraoperative transesophageal echocardiogram N/A 09/24/2013    Procedure: INTRAOPERATIVE TRANSESOPHAGEAL ECHOCARDIOGRAM;  Surgeon: Gaye Pollack, MD;  Location: [facility] OR;  Service: Open Heart Surgery;  Laterality: N/A;  . Left and right heart catheterization with coronary angiogram N/A 09/17/2013    Procedure: LEFT AND RIGHT HEART CATHETERIZATION WITH CORONARY ANGIOGRAM;  Surgeon: Blane Ohara, MD;  Location: [facility] CATH [facility];  Service: Cardiovascular;  Laterality: N/A;    Social History   Social History  . Marital Status: Married    Spouse Name: N/A  . Number of Children: N/A  . Years of Education: N/A   Occupational History  . Not on file.   Social History Main Topics  . Smoking status: Never Smoker   . Smokeless tobacco: Never Used  . Alcohol Use: 0.6 oz/week    1 Glasses of wine per week     Comment: 4 oz nightly red wine  . Drug Use: No  . Sexual Activity: Not on file   Other Topics Concern  . Not on file   Social History Narrative    ROS: no fevers or chills, productive cough, hemoptysis, dysphasia, odynophagia, melena, hematochezia, dysuria, hematuria, rash, seizure activity, orthopnea, PND, pedal edema, claudication. Remaining systems are negative.  Physical Exam: Well-developed well-nourished in no acute distress.  Skin is warm and dry.  HEENT is normal.  Neck is supple.  Chest is clear to auscultation with normal expansion.  Cardiovascular exam is regular rate and rhythm. 2/6 systolic murmur LSB Abdominal exam nontender or distended. No masses palpated. Extremities show no edema. neuro grossly intact  ECG 04/21/2015-sinus rhythm with no ST changes.

## 2015-05-05 ENCOUNTER — Ambulatory Visit (INDEPENDENT_AMBULATORY_CARE_PROVIDER_SITE_OTHER): Payer: Medicare Other | Admitting: Cardiology

## 2015-05-05 ENCOUNTER — Encounter: Payer: Self-pay | Admitting: Cardiology

## 2015-05-05 VITALS — BP 114/62 | HR 69 | Ht 63.0 in | Wt 123.1 lb

## 2015-05-05 DIAGNOSIS — I2510 Atherosclerotic heart disease of native coronary artery without angina pectoris: Secondary | ICD-10-CM

## 2015-05-05 DIAGNOSIS — R002 Palpitations: Secondary | ICD-10-CM | POA: Diagnosis not present

## 2015-05-05 DIAGNOSIS — Z954 Presence of other heart-valve replacement: Secondary | ICD-10-CM | POA: Diagnosis not present

## 2015-05-05 DIAGNOSIS — Z953 Presence of xenogenic heart valve: Secondary | ICD-10-CM

## 2015-05-05 DIAGNOSIS — I2583 Coronary atherosclerosis due to lipid rich plaque: Secondary | ICD-10-CM

## 2015-05-05 NOTE — Patient Instructions (Signed)
Your physician wants you to follow-up in: ONE YEAR WITH DR CRENSHAW You will receive a reminder letter in the mail two months in advance. If you don't receive a letter, please call our office to schedule the follow-up appointment.   If you need a refill on your cardiac medications before your next appointment, please call your pharmacy.  

## 2015-05-05 NOTE — Assessment & Plan Note (Signed)
Continue low-dose beta blocker. Symptoms controlled.

## 2015-05-05 NOTE — Assessment & Plan Note (Signed)
Continue SBE prophylaxis. 

## 2015-05-05 NOTE — Assessment & Plan Note (Signed)
Continue aspirin and statin. She did not tolerate high-dose statin previously.

## 2015-07-14 ENCOUNTER — Telehealth: Payer: Self-pay | Admitting: *Deleted

## 2015-07-14 NOTE — Telephone Encounter (Signed)
Maintenance cardiac rehab orders faxed

## 2015-07-20 ENCOUNTER — Encounter (HOSPITAL_COMMUNITY): Payer: Self-pay

## 2015-07-22 ENCOUNTER — Encounter (HOSPITAL_COMMUNITY): Admission: RE | Admit: 2015-07-22 | Payer: Self-pay | Source: Ambulatory Visit

## 2015-07-24 ENCOUNTER — Encounter (HOSPITAL_COMMUNITY): Payer: Self-pay

## 2015-07-27 ENCOUNTER — Encounter (HOSPITAL_COMMUNITY)
Admission: RE | Admit: 2015-07-27 | Discharge: 2015-07-27 | Disposition: A | Discharge status: Home / Self Care | Payer: Self-pay | Source: Ambulatory Visit | Attending: Cardiology | Admitting: Cardiology

## 2015-07-27 DIAGNOSIS — I38 Endocarditis, valve unspecified: Secondary | ICD-10-CM | POA: Insufficient documentation

## 2015-07-27 DIAGNOSIS — Z951 Presence of aortocoronary bypass graft: Secondary | ICD-10-CM | POA: Insufficient documentation

## 2015-07-29 ENCOUNTER — Encounter (HOSPITAL_COMMUNITY)
Admission: RE | Admit: 2015-07-29 | Discharge: 2015-07-29 | Disposition: A | Discharge status: Home / Self Care | Payer: Self-pay | Source: Ambulatory Visit | Attending: Cardiology | Admitting: Cardiology

## 2015-07-31 ENCOUNTER — Encounter (HOSPITAL_COMMUNITY)
Admission: RE | Admit: 2015-07-31 | Discharge: 2015-07-31 | Disposition: A | Discharge status: Home / Self Care | Payer: Self-pay | Source: Ambulatory Visit | Attending: Cardiology | Admitting: Cardiology

## 2015-08-03 ENCOUNTER — Encounter (HOSPITAL_COMMUNITY)
Admission: RE | Admit: 2015-08-03 | Discharge: 2015-08-03 | Disposition: A | Discharge status: Home / Self Care | Payer: Self-pay | Source: Ambulatory Visit | Attending: Cardiology | Admitting: Cardiology

## 2015-08-05 ENCOUNTER — Encounter (HOSPITAL_COMMUNITY)
Admission: RE | Admit: 2015-08-05 | Discharge: 2015-08-05 | Disposition: A | Discharge status: Home / Self Care | Payer: Self-pay | Source: Ambulatory Visit | Attending: Cardiology | Admitting: Cardiology

## 2015-08-07 ENCOUNTER — Encounter (HOSPITAL_COMMUNITY)
Admission: RE | Admit: 2015-08-07 | Discharge: 2015-08-07 | Disposition: A | Discharge status: Home / Self Care | Payer: Self-pay | Source: Ambulatory Visit | Attending: Cardiology | Admitting: Cardiology

## 2015-08-10 ENCOUNTER — Encounter (HOSPITAL_COMMUNITY)
Admission: RE | Admit: 2015-08-10 | Discharge: 2015-08-10 | Disposition: A | Discharge status: Home / Self Care | Payer: Self-pay | Source: Ambulatory Visit | Attending: Cardiology | Admitting: Cardiology

## 2015-08-12 ENCOUNTER — Encounter (HOSPITAL_COMMUNITY)
Admission: RE | Admit: 2015-08-12 | Discharge: 2015-08-12 | Disposition: A | Discharge status: Home / Self Care | Payer: Self-pay | Source: Ambulatory Visit | Attending: Cardiology | Admitting: Cardiology

## 2015-08-14 ENCOUNTER — Encounter (HOSPITAL_COMMUNITY)
Admission: RE | Admit: 2015-08-14 | Discharge: 2015-08-14 | Disposition: A | Discharge status: Home / Self Care | Payer: Self-pay | Source: Ambulatory Visit | Attending: Cardiology | Admitting: Cardiology

## 2015-08-17 ENCOUNTER — Encounter (HOSPITAL_COMMUNITY)
Admission: RE | Admit: 2015-08-17 | Discharge: 2015-08-17 | Disposition: A | Discharge status: Home / Self Care | Payer: Self-pay | Source: Ambulatory Visit | Attending: Cardiology | Admitting: Cardiology

## 2015-08-17 ENCOUNTER — Other Ambulatory Visit: Payer: Self-pay | Admitting: Cardiology

## 2015-08-17 NOTE — Telephone Encounter (Signed)
Rx(s) sent to pharmacy electronically.  

## 2015-08-19 ENCOUNTER — Encounter (HOSPITAL_COMMUNITY)
Admission: RE | Admit: 2015-08-19 | Discharge: 2015-08-19 | Disposition: A | Discharge status: Home / Self Care | Payer: Self-pay | Source: Ambulatory Visit | Attending: Cardiology | Admitting: Cardiology

## 2015-08-21 ENCOUNTER — Encounter (HOSPITAL_COMMUNITY): Payer: Self-pay

## 2015-08-24 ENCOUNTER — Encounter (HOSPITAL_COMMUNITY)
Admission: RE | Admit: 2015-08-24 | Discharge: 2015-08-24 | Disposition: A | Discharge status: Home / Self Care | Payer: Self-pay | Source: Ambulatory Visit | Attending: Cardiology | Admitting: Cardiology

## 2015-08-24 DIAGNOSIS — Z951 Presence of aortocoronary bypass graft: Secondary | ICD-10-CM | POA: Insufficient documentation

## 2015-08-24 DIAGNOSIS — I38 Endocarditis, valve unspecified: Secondary | ICD-10-CM | POA: Insufficient documentation

## 2015-08-26 ENCOUNTER — Encounter (HOSPITAL_COMMUNITY)
Admission: RE | Admit: 2015-08-26 | Discharge: 2015-08-26 | Disposition: A | Discharge status: Home / Self Care | Payer: Self-pay | Source: Ambulatory Visit | Attending: Cardiology | Admitting: Cardiology

## 2015-08-28 ENCOUNTER — Encounter (HOSPITAL_COMMUNITY)
Admission: RE | Admit: 2015-08-28 | Discharge: 2015-08-28 | Disposition: A | Discharge status: Home / Self Care | Payer: Self-pay | Source: Ambulatory Visit | Attending: Cardiology | Admitting: Cardiology

## 2015-08-31 ENCOUNTER — Encounter (HOSPITAL_COMMUNITY)
Admission: RE | Admit: 2015-08-31 | Discharge: 2015-08-31 | Disposition: A | Discharge status: Home / Self Care | Payer: Self-pay | Source: Ambulatory Visit | Attending: Cardiology | Admitting: Cardiology

## 2015-09-02 ENCOUNTER — Encounter (HOSPITAL_COMMUNITY)
Admission: RE | Admit: 2015-09-02 | Discharge: 2015-09-02 | Disposition: A | Discharge status: Home / Self Care | Payer: Self-pay | Source: Ambulatory Visit | Attending: Cardiology | Admitting: Cardiology

## 2015-09-04 ENCOUNTER — Encounter (HOSPITAL_COMMUNITY)
Admission: RE | Admit: 2015-09-04 | Discharge: 2015-09-04 | Disposition: A | Discharge status: Home / Self Care | Payer: Self-pay | Source: Ambulatory Visit | Attending: Cardiology | Admitting: Cardiology

## 2015-09-07 ENCOUNTER — Encounter (HOSPITAL_COMMUNITY)
Admission: RE | Admit: 2015-09-07 | Discharge: 2015-09-07 | Disposition: A | Discharge status: Home / Self Care | Payer: Self-pay | Source: Ambulatory Visit | Attending: Cardiology | Admitting: Cardiology

## 2015-09-09 ENCOUNTER — Encounter (HOSPITAL_COMMUNITY)
Admission: RE | Admit: 2015-09-09 | Discharge: 2015-09-09 | Disposition: A | Discharge status: Home / Self Care | Payer: Self-pay | Source: Ambulatory Visit | Attending: Cardiology | Admitting: Cardiology

## 2015-09-11 ENCOUNTER — Encounter (HOSPITAL_COMMUNITY)
Admission: RE | Admit: 2015-09-11 | Discharge: 2015-09-11 | Disposition: A | Discharge status: Home / Self Care | Payer: Self-pay | Source: Ambulatory Visit | Attending: Cardiology | Admitting: Cardiology

## 2015-09-14 ENCOUNTER — Encounter (HOSPITAL_COMMUNITY)
Admission: RE | Admit: 2015-09-14 | Discharge: 2015-09-14 | Disposition: A | Discharge status: Home / Self Care | Payer: Self-pay | Source: Ambulatory Visit | Attending: Cardiology | Admitting: Cardiology

## 2015-09-16 ENCOUNTER — Encounter (HOSPITAL_COMMUNITY)
Admission: RE | Admit: 2015-09-16 | Discharge: 2015-09-16 | Disposition: A | Discharge status: Home / Self Care | Payer: Self-pay | Source: Ambulatory Visit | Attending: Cardiology | Admitting: Cardiology

## 2015-09-18 ENCOUNTER — Encounter (HOSPITAL_COMMUNITY)
Admission: RE | Admit: 2015-09-18 | Discharge: 2015-09-18 | Disposition: A | Discharge status: Home / Self Care | Payer: Self-pay | Source: Ambulatory Visit | Attending: Cardiology | Admitting: Cardiology

## 2015-09-23 ENCOUNTER — Encounter (HOSPITAL_COMMUNITY): Payer: Self-pay

## 2015-09-25 ENCOUNTER — Encounter (HOSPITAL_COMMUNITY): Payer: Self-pay

## 2015-09-28 ENCOUNTER — Encounter (HOSPITAL_COMMUNITY): Payer: Self-pay

## 2015-09-30 ENCOUNTER — Encounter (HOSPITAL_COMMUNITY): Payer: Self-pay

## 2015-10-02 ENCOUNTER — Encounter (HOSPITAL_COMMUNITY): Payer: Self-pay

## 2015-10-05 ENCOUNTER — Encounter (HOSPITAL_COMMUNITY): Payer: Self-pay

## 2015-10-07 ENCOUNTER — Encounter (HOSPITAL_COMMUNITY): Payer: Self-pay

## 2015-10-09 ENCOUNTER — Encounter (HOSPITAL_COMMUNITY): Payer: Self-pay

## 2015-10-12 ENCOUNTER — Encounter (HOSPITAL_COMMUNITY): Payer: Self-pay

## 2015-10-14 ENCOUNTER — Encounter (HOSPITAL_COMMUNITY): Payer: Self-pay

## 2015-10-16 ENCOUNTER — Encounter (HOSPITAL_COMMUNITY): Payer: Self-pay

## 2015-10-19 ENCOUNTER — Encounter (HOSPITAL_COMMUNITY): Payer: Self-pay

## 2015-10-21 ENCOUNTER — Encounter (HOSPITAL_COMMUNITY): Payer: Self-pay

## 2015-10-23 ENCOUNTER — Encounter (HOSPITAL_COMMUNITY): Payer: Self-pay

## 2015-10-28 ENCOUNTER — Telehealth: Payer: Self-pay | Admitting: Cardiology

## 2015-10-28 NOTE — Telephone Encounter (Signed)
New message   pt is calling for the rn to see what she can take over the counter

## 2015-10-28 NOTE — Telephone Encounter (Signed)
Pt to see PCP for bursitis tomorrow.  She is seeking recommendation for pain management in interim - has been taking tylenol 500mg  1 tablet 2-3 times daily which is working for her - she wanted to make sure she wasn't exceeding recommended daily dose. Reviewed instructions on tylenol bottle w patient - pt dosing appropriate. Pt voiced understanding.

## 2015-11-21 ENCOUNTER — Ambulatory Visit (INDEPENDENT_AMBULATORY_CARE_PROVIDER_SITE_OTHER): Payer: Medicare Other

## 2015-11-21 ENCOUNTER — Ambulatory Visit (INDEPENDENT_AMBULATORY_CARE_PROVIDER_SITE_OTHER): Payer: Medicare Other | Admitting: Family Medicine

## 2015-11-21 VITALS — BP 110/68 | HR 69 | Temp 97.5°F | Resp 16 | Ht 63.75 in | Wt 112.0 lb

## 2015-11-21 DIAGNOSIS — M79671 Pain in right foot: Secondary | ICD-10-CM

## 2015-11-21 NOTE — Patient Instructions (Addendum)
Continue Tylenol 650 mg as needed for pain. Do not exceed 3500 mg within 24 hour period. Alternate ice and heat as needed for comfort. Continue to move foot often to prevent stiffness.   IF you received an x-ray today, you will receive an invoice from [facility]. Please contact [facility] at [phone] with questions or concerns regarding your invoice.   IF you received labwork today, you will receive an invoice from [employer]. Please contact [facility] at [phone] with questions or concerns regarding your invoice.   Our billing staff will not be able to assist you with questions regarding bills from these companies.  You will be contacted with the lab results as soon as they are available. The fastest way to get your results is to activate your My Chart account. Instructions are located on the last page of this paperwork. If you have not heard from [place] regarding the results in 2 weeks, please contact this office.      Foot Contusion  A foot contusion is a deep bruise to the foot. Contusions happen when an injury causes bleeding under the skin. Signs of bruising include pain, puffiness (swelling), and discolored skin. The contusion may turn blue, purple, or yellow. HOME CARE  Put ice on the injured area.  Put ice in a plastic bag.  Place a towel between your skin and the bag.  Leave the ice on for 15-20 minutes, 03-04 times a day.  Only take medicines as told by your doctor.  Use an elastic wrap only as told. You may remove the wrap for sleeping, showering, and bathing. Take the wrap off if you lose feeling (numb) in your toes, or they turn blue or cold. Put the wrap on more loosely.  Keep the foot raised (elevated) with pillows.  If your foot hurts, avoid standing or walking.  When your doctor says it is okay to use your foot, start using it slowly. If you have pain, lessen how much you use your foot.  See your doctor as  told. GET HELP RIGHT AWAY IF:   You have more redness, puffiness, or pain in your foot.  Your puffiness or pain does not get better with medicine.  You lose feeling in your foot, or you cannot move your toes.  Your foot turns cold or blue.  You have pain when you move your toes.  Your foot feels warm.  Your contusion does not get better in 2 days. MAKE SURE YOU:   Understand these instructions.  Will watch this condition.  Will get help right away if you or your child is not doing well or gets worse.   This information is not intended to replace advice given to you by your health care provider. Make sure you discuss any questions you have with your health care provider.   Document Released: 01/19/2008 Document Revised: 10/11/2011 Document Reviewed: 12/16/2014 Elsevier Interactive Patient Education [employer].

## 2015-11-21 NOTE — Progress Notes (Signed)
Patient ID: Linda Parker, female    DOB: Jul 15, 1935, 80 y.o.   MRN: [id number]  PCP: Haywood Pao, MD  Chief Complaint  Patient presents with  . Foot Injury    right     Subjective:   HPI Presents for evaluation of foot injury which occurred at home times 1 day.  Patient reports kicking a bag out the door and accidentally misdirecting her foot resulting in the right lateral side of her foot hitting  the door. Able to move toes and ankle multi-directionally without pain. Reports pain with walking 5/10. Took Tylenol for pain which help with pain. Applied ice. Reports increased discoloring today compared to yesterday.  . Social History   Social History  . Marital status: Married    Spouse name: N/A  . Number of children: N/A  . Years of education: N/A   Occupational History  . Not on file.   Social History Main Topics  . Smoking status: Never Smoker  . Smokeless tobacco: Never Used  . Alcohol use 0.6 oz/week    1 Glasses of wine per week     Comment: 4 oz nightly red wine  . Drug use: No  . Sexual activity: Not on file   Other Topics Concern  . Not on file   Social History Narrative  . No narrative on file   . Family History  Problem Relation Age of Onset  . Heart disease Mother   . Cancer Father      Review of Systems  Constitutional: Negative.   Respiratory: Negative.   Cardiovascular: Negative.   Musculoskeletal:       See HPI       Patient Active Problem List   Diagnosis Date Noted  . S/P aortic valve replacement with bioprosthetic valve 04/21/2015  . CAD (coronary artery disease) 09/24/2013  . Postoperative anemia due to acute blood loss 03/05/2013  . OA (osteoarthritis) of knee 03/04/2013  . Chest pain 08/25/2011  . Aortic valve disorder 04/16/2009  . UNSPECIFIED HYPOTHYROIDISM 02/23/2009  . PALPITATIONS 02/23/2009  . DYSPNEA 02/23/2009     Prior to Admission medications   Medication Sig Start Date End Date Taking? Authorizing  Provider  acetaminophen (TYLENOL) 500 MG tablet Take 500 mg by mouth every 6 (six) hours as needed (takes qhs PRN).    Historical Provider, MD  amoxicillin (AMOXIL) 500 MG capsule Take 2 grams  (  4 capsules )  1 hour before dental work. 01/07/14   Lelon Perla, MD  Ascorbic Acid (VITAMIN C) POWD Take by mouth. Taking 1/4 teaspoon daily    Historical Provider, MD  aspirin 81 MG tablet Take 81 mg by mouth daily.    Historical Provider, MD  cholecalciferol (VITAMIN D) 1000 UNITS tablet Take 2,000 Units by mouth daily.     Historical Provider, MD  CINNAMON PO Take by mouth. Takes 1/4 teaspoon daily    Historical Provider, MD  estradiol (ESTRACE) 0.5 MG tablet Take 0.5 mg by mouth daily before breakfast.     Historical Provider, MD  folic acid (FOLVITE) Q000111Q MCG tablet Take 400 mcg by mouth daily.    Historical Provider, MD  Glucosamine-Chondroitin (GLUCOSAMINE CHONDR COMPLEX PO) Take 1 tablet by mouth 2 (two) times daily.    Historical Provider, MD  ibuprofen (ADVIL,MOTRIN) 200 MG tablet Take 200 mg by mouth every 6 (six) hours as needed for headache or mild pain.    Historical Provider, MD  levothyroxine (SYNTHROID, LEVOTHROID) 50 MCG tablet  Take 50 mcg by mouth daily before breakfast.    Historical Provider, MD  lovastatin (MEVACOR) 40 MG tablet Take 40 mg by mouth at bedtime. Per Dr. Osborne Casco    Historical Provider, MD  metoprolol succinate (TOPROL-XL) 25 MG 24 hr tablet Take 12.5 mg by mouth daily.    Historical Provider, MD  metoprolol succinate (TOPROL-XL) 25 MG 24 hr tablet Take 0.5 tablets (12.5 mg total) by mouth daily. 08/17/15   Lelon Perla, MD  Misc Natural Products (TART CHERRY ADVANCED PO) Take 1,000 mg by mouth.    Historical Provider, MD  Multiple Vitamins-Minerals (VITA-MIN PO) Take 75 mg by mouth daily.     Historical Provider, MD  omega-3 acid ethyl esters (LOVAZA) 1 G capsule Take 1 g by mouth 2 (two) times daily.    Historical Provider, MD  vitamin E 400 UNIT capsule Take 400  Units by mouth daily.    Historical Provider, MD     Allergies  Allergen Reactions  . Erythromycin Nausea Only    Feels like she is dying/out of her body  . Monosodium Glutamate Other (See Comments)    Migraine headache   . Nitrates, Organic     headache  . Sulfonamide Derivatives Nausea Only    Feels like she is dying/out of her body       Objective:  Physical Exam  Constitutional: She is oriented to person, place, and time. She appears well-developed and well-nourished.  HENT:  Head: Normocephalic.  Right Ear: External ear normal.  Left Ear: External ear normal.  Eyes: Conjunctivae are normal. Pupils are equal, round, and reactive to light.  Neck: Normal range of motion. Neck supple.  Cardiovascular: Normal rate, regular rhythm, normal heart sounds and intact distal pulses.   Pulmonary/Chest: Effort normal and breath sounds normal.  Musculoskeletal: She exhibits edema.  Right Foot: Localized edema right lateral foot. Circular ecchymosis at point of injury.  Able to move all toes pain free. Tenderness with dorsi-flexion and mildly tender with planter flexion   Neurological: She is alert and oriented to person, place, and time.  Skin: Skin is warm and dry.  Psychiatric: She has a normal mood and affect. Her behavior is normal. Judgment and thought content normal.    .Dg Foot Complete Right  Result Date: 11/21/2015 CLINICAL DATA:  80 year old female with acute right foot pain following fall yesterday. Initial encounter. EXAM: RIGHT FOOT COMPLETE - 3+ VIEW COMPARISON:  None. FINDINGS: There is no evidence of fracture or dislocation. There is no evidence of arthropathy or other focal bone abnormality. Soft tissues are unremarkable. IMPRESSION: Negative. Electronically Signed   By: Margarette Canada M.D.   On: 11/21/2015 10:03       Assessment & Plan:  .1. Right foot pain - DG Foot Complete Right; No evidence of fracture. Wrapped foot in an ACE bandage  Keep foot elevated  when sitting to prevent further swelling. Take acetaminophen 650 mg as needed for pain.  Carroll Sage. Kenton Kingfisher, MSN, FNP-C [facility]

## 2016-02-27 ENCOUNTER — Ambulatory Visit (INDEPENDENT_AMBULATORY_CARE_PROVIDER_SITE_OTHER): Payer: Medicare Other

## 2016-02-27 ENCOUNTER — Ambulatory Visit (INDEPENDENT_AMBULATORY_CARE_PROVIDER_SITE_OTHER): Payer: Medicare Other | Admitting: Osteopathic Medicine

## 2016-02-27 VITALS — BP 110/70 | HR 103 | Temp 98.3°F | Ht 62.25 in | Wt 112.0 lb

## 2016-02-27 DIAGNOSIS — R9389 Abnormal findings on diagnostic imaging of other specified body structures: Secondary | ICD-10-CM

## 2016-02-27 DIAGNOSIS — R05 Cough: Secondary | ICD-10-CM | POA: Diagnosis not present

## 2016-02-27 DIAGNOSIS — R059 Cough, unspecified: Secondary | ICD-10-CM

## 2016-02-27 DIAGNOSIS — J019 Acute sinusitis, unspecified: Secondary | ICD-10-CM

## 2016-02-27 DIAGNOSIS — R938 Abnormal findings on diagnostic imaging of other specified body structures: Secondary | ICD-10-CM | POA: Diagnosis not present

## 2016-02-27 MED ORDER — BENZONATATE 200 MG PO CAPS
200.0000 mg | ORAL_CAPSULE | Freq: Three times a day (TID) | ORAL | 0 refills | Status: DC | PRN
Start: 2016-02-27 — End: 2016-05-05

## 2016-02-27 MED ORDER — AMOXICILLIN-POT CLAVULANATE 875-125 MG PO TABS
1.0000 | ORAL_TABLET | Freq: Two times a day (BID) | ORAL | 0 refills | Status: DC
Start: 2016-02-27 — End: 2016-05-05

## 2016-02-27 MED ORDER — IPRATROPIUM BROMIDE 0.03 % NA SOLN
2.0000 | Freq: Three times a day (TID) | NASAL | 0 refills | Status: DC | PRN
Start: 2016-02-27 — End: 2016-05-05

## 2016-02-27 MED ORDER — GUAIFENESIN-CODEINE 100-10 MG/5ML PO SYRP: 5.0000 mL | ORAL_SOLUTION | Freq: Three times a day (TID) | ORAL | 0 refills | Status: DC | PRN

## 2016-02-27 MED ORDER — PREDNISONE 20 MG PO TABS: 20.0000 mg | ORAL_TABLET | Freq: Two times a day (BID) | ORAL | 0 refills | Status: DC

## 2016-02-27 NOTE — Progress Notes (Signed)
HPI: Linda Parker is a 80 y.o. female  who presents to [facility] today, 02/27/16,  for chief complaint of:  Chief Complaint  Patient presents with  . Cough    x 3 dqys - productive - green in color  . Fatigue  . Sinusitis  . Shortness of Breath     . Context: No hx respiratory problems, first husband was exposure to secondhand smoke, pt is nonsmoker. Reports coughing, sinus congestion and fatigue . Duration: 3 days . Timing: constant . Modifying factors: OTC meds not helpful . Assoc signs/symptoms: no fever/chills  Patient is accompanied by husband who assists with history-taking.   Past medical, surgical, social and family history reviewed: Past Medical History:  Diagnosis Date  . Allergy   . Aortic stenosis    s/p AVR w/ 23 mm Edward Magna-ease Pericardial Valve  . Arthritis    hands & knee- L   . Dysrhythmia    when taking antihistamines  . Heart murmur   . History of echocardiogram    Echo (8/15): EF 55-60%, normal wall motion, grade 1 diastolic dysfunction, AVR okay (mean 10 mm Hg), normal RV function, moderate TR  . Osteoporosis   . Palpitations   . Unspecified hypothyroidism    Past Surgical History:  Procedure Laterality Date  . ABDOMINAL HYSTERECTOMY    . AORTIC VALVE REPLACEMENT N/A 09/24/2013   Procedure: AORTIC VALVE REPLACEMENT (AVR) using Pericardial Tissue Valve (size 72mm).;  Surgeon: Gaye Pollack, MD;  Location: [place] OR;  Service: Open Heart Surgery;  Laterality: N/A;  . APPENDECTOMY     at time of hysterectomy  . bilateral knee surgery  35 yrs. ago   cartilage removed from knees  . CARDIAC CATHETERIZATION    . CARDIAC VALVE REPLACEMENT    . CORONARY ARTERY BYPASS GRAFT N/A 09/24/2013   Procedure: CORONARY ARTERY BYPASS GRAFTING (CABG) x4: LIMA to LAD, SVG to Diagonal 1, SVG to Diagonal 2, Svg to OM 1.;  Surgeon: Gaye Pollack, MD;  Location: [facility] OR;  Service: Open Heart Surgery;  Laterality: N/A;  . FINGER SURGERY     left  hand  . INJECTION KNEE Left 03/04/2013   Procedure: left KNEE cortisone INJECTION;  Surgeon: Gearlean Alf, MD;  Location: [facility];  Service: Orthopedics;  Laterality: Left;  . INTRAOPERATIVE TRANSESOPHAGEAL ECHOCARDIOGRAM N/A 09/24/2013   Procedure: INTRAOPERATIVE TRANSESOPHAGEAL ECHOCARDIOGRAM;  Surgeon: Gaye Pollack, MD;  Location: [facility] OR;  Service: Open Heart Surgery;  Laterality: N/A;  . LEFT AND RIGHT HEART CATHETERIZATION WITH CORONARY ANGIOGRAM N/A 09/17/2013   Procedure: LEFT AND RIGHT HEART CATHETERIZATION WITH CORONARY ANGIOGRAM;  Surgeon: Blane Ohara, MD;  Location: [facility] CATH LAB;  Service: Cardiovascular;  Laterality: N/A;  . TONSILLECTOMY    . TOTAL KNEE ARTHROPLASTY Right 03/04/2013   Procedure: RIGHT TOTAL KNEE ARTHROPLASTY;  Surgeon: Gearlean Alf, MD;  Location: [facility];  Service: Orthopedics;  Laterality: Right;   Social History  Substance Use Topics  . Smoking status: Never Smoker  . Smokeless tobacco: Never Used  . Alcohol use 0.6 oz/week    1 Glasses of wine per week     Comment: 4 oz nightly red wine   Family History  Problem Relation Age of Onset  . Heart disease Mother   . Cancer Father      Current medication list and allergy/intolerance information reviewed:   Current Outpatient Prescriptions  Medication Sig Dispense Refill  . acetaminophen (TYLENOL) 500 MG tablet  Take 500 mg by mouth every 6 (six) hours as needed (takes qhs PRN).    . Ascorbic Acid (VITAMIN C) POWD Take by mouth. Taking 1/4 teaspoon daily    . aspirin 81 MG tablet Take 81 mg by mouth daily.    . cholecalciferol (VITAMIN D) 1000 UNITS tablet Take 2,000 Units by mouth daily.     Marland Kitchen CINNAMON PO Take by mouth. Takes 1/4 teaspoon daily    . estradiol (ESTRACE) 0.5 MG tablet Take 0.5 mg by mouth daily before breakfast.     . folic acid (FOLVITE) Q000111Q MCG tablet Take 400 mcg by mouth daily.    . Glucosamine-Chondroitin (GLUCOSAMINE CHONDR COMPLEX PO) Take 1 tablet by mouth 2 (two) times daily.     Marland Kitchen levothyroxine (SYNTHROID, LEVOTHROID) 50 MCG tablet Take 50 mcg by mouth daily before breakfast.    . lovastatin (MEVACOR) 40 MG tablet Take 40 mg by mouth at bedtime. Per Dr. Osborne Casco    . metoprolol succinate (TOPROL-XL) 25 MG 24 hr tablet Take 0.5 tablets (12.5 mg total) by mouth daily. 45 tablet 2  . Multiple Vitamins-Minerals (VITA-MIN PO) Take 75 mg by mouth daily.     Marland Kitchen omega-3 acid ethyl esters (LOVAZA) 1 G capsule Take 1 g by mouth 2 (two) times daily.    . vitamin E 400 UNIT capsule Take 400 Units by mouth daily.    Marland Kitchen amoxicillin (AMOXIL) 500 MG capsule Take 2 grams  (  4 capsules )  1 hour before dental work. (Patient not taking: Reported on 02/27/2016) 4 capsule 1  . ibuprofen (ADVIL,MOTRIN) 200 MG tablet Take 200 mg by mouth every 6 (six) hours as needed for headache or mild pain.     No current facility-administered medications for this visit.    Allergies  Allergen Reactions  . Erythromycin Nausea Only    Feels like she is dying/out of her body  . Monosodium Glutamate Other (See Comments)    Migraine headache   . Nitrates, Organic     headache  . Sulfonamide Derivatives Nausea Only    Feels like she is dying/out of her body  . Ciprofloxacin Palpitations    Feels out of her body experience      Review of Systems:  Constitutional:  No  fever, no chills, +recent illness, No unintentional weight changes. +significant fatigue.   HEENT: No  headache, no vision change, no hearing change, +sore throat, +sinus pressure  Cardiac: No  chest pain, No  pressure  Respiratory:  +Cough  Gastrointestinal: No  abdominal pain, No  nausea, No  vomiting  Musculoskeletal: +generalized myalgia/arthralgia  Skin: No  Rash,   Exam:  BP 110/70 (BP Location: Right Arm, Patient Position: Sitting, Cuff Size: Normal)   Pulse (!) 103   Temp 98.3 F (36.8 C) (Oral)   Ht 5' 2.25" (1.581 m)   Wt 112 lb (50.8 kg)   SpO2 98%   BMI 20.32 kg/m   Constitutional: VS see above. General  Appearance: alert, well-developed, well-nourished, NAD  Eyes: Normal lids and conjunctive, non-icteric sclera  Ears, Nose, Mouth, Throat: MMM, Normal external inspection ears/nares/mouth/lips/gums. TM normal bilaterally. Pharynx/tonsils no erythema, no exudate. Nasal mucosa normal. PERRLA  Neck: No masses, trachea midline. No thyroid enlargement. No tenderness/mass appreciated. No lymphadenopathy  Respiratory: Normal respiratory effort. no wheeze, no rhonchi, no rales  Cardiovascular: S1/S2 normal, no murmur, no rub/gallop auscultated. RRR. No lower extremity edema.   Gastrointestinal: Nontender, no masses.  Musculoskeletal: Gait normal.   Neurological: \  Normal balance/coordination. No tremor.   Skin: warm, dry, intact.   Dg Chest 2 View  Result Date: 02/27/2016 CLINICAL DATA:  Cough and fatigue EXAM: CHEST  2 VIEW COMPARISON:  10/30/2013 FINDINGS: Previous median sternotomy for coronary bypass and aortic valve replacement. Stable heart size and vascularity. Hyperinflation noted compatible with background COPD/ emphysema. No superimposed pneumonia, edema, CHF or effusion. Negative for pneumothorax. Trachea is midline. Atherosclerosis of the aorta. Degenerative changes noted of the spine. IMPRESSION: Stable postoperative findings as above. Stable hyperinflation compatible with COPD/ emphysema Thoracic aortic atherosclerosis No superimposed acute process Electronically Signed   By: Jerilynn Mages.  Shick M.D.   On: 02/27/2016 11:33    ASSESSMENT/PLAN: Suspect viral syndrome, viral sinusitis/URI with cough. Advised supportive care and Rx as below. No Hx COPD but CXR on personal review and radiologist report c/w COPD, will treat as exacerbation but I think mucus production can also be explained by postnasal drip, consider PFT  Cough - Plan: DG Chest 2 View, benzonatate (TESSALON) 200 MG capsule, guaiFENesin-codeine (ROBITUSSIN AC) 100-10 MG/5ML syrup, ipratropium (ATROVENT) 0.03 % nasal spray,  amoxicillin-clavulanate (AUGMENTIN) 875-125 MG tablet, predniSONE (DELTASONE) 20 MG tablet  Acute rhinosinusitis - Plan: benzonatate (TESSALON) 200 MG capsule, guaiFENesin-codeine (ROBITUSSIN AC) 100-10 MG/5ML syrup, ipratropium (ATROVENT) 0.03 % nasal spray, amoxicillin-clavulanate (AUGMENTIN) 875-125 MG tablet, predniSONE (DELTASONE) 20 MG tablet  Abnormal chest x-ray - cw COPD in pt w/o hx - consider PFT f/u  Patient Instructions   Plan:  Fill all prescriptions except antibiotic  Hold on to antibiotic Rx in case you're not feeling better 5 - 7 days after your symptoms first started  Call us with any questions or concerns  Seek care if yo ustart to feel worse   Visit summary with medication list and pertinent instructions was printed for patient to review. All questions at time of visit were answered - patient instructed to contact office with any additional concerns. ER/RTC precautions were reviewed with the patient. Follow-up plan: Return if symptoms worsen or fail to improve.

## 2016-02-27 NOTE — Patient Instructions (Addendum)
Plan:  Fill all prescriptions except antibiotic  Hold on to antibiotic Rx in case you're not feeling better 5 - 7 days after your symptoms first started  Call us with any questions or concerns  Seek care if yo ustart to feel worse    IF you received an x-ray today, you will receive an invoice from [facility]. Please contact [facility] at [phone] with questions or concerns regarding your invoice.   IF you received labwork today, you will receive an invoice from [employer]. Please contact [facility] at [phone] with questions or concerns regarding your invoice.   Our billing staff will not be able to assist you with questions regarding bills from these companies.  You will be contacted with the lab results as soon as they are available. The fastest way to get your results is to activate your My Chart account. Instructions are located on the last page of this paperwork. If you have not heard from [place] regarding the results in 2 weeks, please contact this office.

## 2016-04-29 NOTE — Progress Notes (Signed)
HPI: FU AVR and CAD. Echocardiogram in May 2015 demonstrated severe aortic stenosis. She was symptomatic and referred for cardiac catheterization. This demonstrated 3 vessel CAD. She was referred for CABG and AVR. She was admitted 6/15 and underwent CABG (LIMA-LAD, SVG-D1, SVG-D2, SVG-OM1) and bioprosthetic AVR (23 mm Edwards magna-ease pericardial valve). Note preoperative carotid Dopplers May 2015 showed bilateral 1-39% stenosis. Last echocardiogram January 2017 showed normal LV function, grade 1 diastolic dysfunction, bioprosthetic aortic valve with mean gradient 9 mmHg and moderate left atrial enlargement. Since last seen, the patient has dyspnea with more extreme activities but not with routine activities. It is relieved with rest. It is not associated with chest pain. There is no orthopnea, PND or pedal edema. There is no syncope or palpitations. There is no exertional chest pain.   Current Outpatient Prescriptions  Medication Sig Dispense Refill  . acetaminophen (TYLENOL) 500 MG tablet Take 500 mg by mouth every 6 (six) hours as needed (takes qhs PRN).    Marland Kitchen amoxicillin (AMOXIL) 500 MG capsule Take 2 grams  (  4 capsules )  1 hour before dental work. (Patient not taking: Reported on 02/27/2016) 4 capsule 1  . Ascorbic Acid (VITAMIN C) POWD Take by mouth. Taking 1/4 teaspoon daily    . aspirin 81 MG tablet Take 81 mg by mouth daily.    . cholecalciferol (VITAMIN D) 1000 UNITS tablet Take 2,000 Units by mouth daily.     Marland Kitchen CINNAMON PO Take by mouth. Takes 1/4 teaspoon daily    . estradiol (ESTRACE) 0.5 MG tablet Take 0.5 mg by mouth daily before breakfast.     . folic acid (FOLVITE) Q000111Q MCG tablet Take 400 mcg by mouth daily.    . Glucosamine-Chondroitin (GLUCOSAMINE CHONDR COMPLEX PO) Take 1 tablet by mouth 2 (two) times daily.    Marland Kitchen ibuprofen (ADVIL,MOTRIN) 200 MG tablet Take 200 mg by mouth every 6 (six) hours as needed for headache or mild pain.    Marland Kitchen levothyroxine (SYNTHROID,  LEVOTHROID) 50 MCG tablet Take 50 mcg by mouth daily before breakfast.    . lovastatin (MEVACOR) 40 MG tablet Take 40 mg by mouth at bedtime. Per Dr. Osborne Casco    . metoprolol succinate (TOPROL-XL) 25 MG 24 hr tablet Take 0.5 tablets (12.5 mg total) by mouth daily. 45 tablet 2  . Multiple Vitamins-Minerals (VITA-MIN PO) Take 75 mg by mouth daily.     Marland Kitchen omega-3 acid ethyl esters (LOVAZA) 1 G capsule Take 1 g by mouth 2 (two) times daily.    . vitamin E 400 UNIT capsule Take 400 Units by mouth daily.     No current facility-administered medications for this visit.      Past Medical History:  Diagnosis Date  . Allergy   . Aortic stenosis    s/p AVR w/ 23 mm Edward Magna-ease Pericardial Valve  . Arthritis    hands & knee- L   . Dysrhythmia    when taking antihistamines  . Heart murmur   . History of echocardiogram    Echo (8/15): EF 55-60%, normal wall motion, grade 1 diastolic dysfunction, AVR okay (mean 10 mm Hg), normal RV function, moderate TR  . Osteoporosis   . Palpitations   . Unspecified hypothyroidism     Past Surgical History:  Procedure Laterality Date  . ABDOMINAL HYSTERECTOMY    . AORTIC VALVE REPLACEMENT N/A 09/24/2013   Procedure: AORTIC VALVE REPLACEMENT (AVR) using Pericardial Tissue Valve (size 84mm).;  Surgeon: Fernande Boyden  Cyndia Bent, MD;  Location: [place] OR;  Service: Open Heart Surgery;  Laterality: N/A;  . APPENDECTOMY     at time of hysterectomy  . bilateral knee surgery  35 yrs. ago   cartilage removed from knees  . CARDIAC CATHETERIZATION    . CARDIAC VALVE REPLACEMENT    . CORONARY ARTERY BYPASS GRAFT N/A 09/24/2013   Procedure: CORONARY ARTERY BYPASS GRAFTING (CABG) x4: LIMA to LAD, SVG to Diagonal 1, SVG to Diagonal 2, Svg to OM 1.;  Surgeon: Gaye Pollack, MD;  Location: [facility] OR;  Service: Open Heart Surgery;  Laterality: N/A;  . FINGER SURGERY     left hand  . INJECTION KNEE Left 03/04/2013   Procedure: left KNEE cortisone INJECTION;  Surgeon: Gearlean Alf, MD;   Location: [facility];  Service: Orthopedics;  Laterality: Left;  . INTRAOPERATIVE TRANSESOPHAGEAL ECHOCARDIOGRAM N/A 09/24/2013   Procedure: INTRAOPERATIVE TRANSESOPHAGEAL ECHOCARDIOGRAM;  Surgeon: Gaye Pollack, MD;  Location: [facility] OR;  Service: Open Heart Surgery;  Laterality: N/A;  . LEFT AND RIGHT HEART CATHETERIZATION WITH CORONARY ANGIOGRAM N/A 09/17/2013   Procedure: LEFT AND RIGHT HEART CATHETERIZATION WITH CORONARY ANGIOGRAM;  Surgeon: Blane Ohara, MD;  Location: [facility] CATH LAB;  Service: Cardiovascular;  Laterality: N/A;  . TONSILLECTOMY    . TOTAL KNEE ARTHROPLASTY Right 03/04/2013   Procedure: RIGHT TOTAL KNEE ARTHROPLASTY;  Surgeon: Gearlean Alf, MD;  Location: [facility];  Service: Orthopedics;  Laterality: Right;    Social History   Social History  . Marital status: Married    Spouse name: N/A  . Number of children: N/A  . Years of education: N/A   Occupational History  . Not on file.   Social History Main Topics  . Smoking status: Never Smoker  . Smokeless tobacco: Never Used  . Alcohol use 0.6 oz/week    1 Glasses of wine per week     Comment: 4 oz nightly red wine  . Drug use: No  . Sexual activity: Not on file   Other Topics Concern  . Not on file   Social History Narrative  . No narrative on file    Family History  Problem Relation Age of Onset  . Heart disease Mother   . Cancer Father     ROS: no fevers or chills, productive cough, hemoptysis, dysphasia, odynophagia, melena, hematochezia, dysuria, hematuria, rash, seizure activity, orthopnea, PND, pedal edema, claudication. Remaining systems are negative.  Physical Exam: Well-developed well-nourished in no acute distress.  Skin is warm and dry.  HEENT is normal.  Neck is supple.  Chest is clear to auscultation with normal expansion.  Cardiovascular exam is regular rate and rhythm. 2/6 systolic murmur LSB Abdominal exam nontender or distended. No masses palpated. Extremities show no edema. neuro  grossly intact  ECG-Sinus rhythm, LAD, no ST changes  A/P  1 status post aortic valve replacement-no new murmurs or symptoms. Continue SBE prophylaxis.  2 coronary artery disease-continue aspirin and statin. She did not tolerate high-dose statins previously. Note lipids and liver are monitored by primary care.  3 palpitations-symptoms seem reasonably well-controlled. Continue beta blocker.  [name], MD

## 2016-05-05 ENCOUNTER — Ambulatory Visit (INDEPENDENT_AMBULATORY_CARE_PROVIDER_SITE_OTHER): Payer: Medicare PPO | Admitting: Cardiology

## 2016-05-05 ENCOUNTER — Encounter: Payer: Self-pay | Admitting: Cardiology

## 2016-05-05 VITALS — BP 100/60 | HR 70 | Ht 63.0 in | Wt 116.2 lb

## 2016-05-05 DIAGNOSIS — Z953 Presence of xenogenic heart valve: Secondary | ICD-10-CM

## 2016-05-05 DIAGNOSIS — I2510 Atherosclerotic heart disease of native coronary artery without angina pectoris: Secondary | ICD-10-CM

## 2016-05-05 NOTE — Patient Instructions (Signed)
Your physician wants you to follow-up in: ONE YEAR WITH DR CRENSHAW You will receive a reminder letter in the mail two months in advance. If you don't receive a letter, please call our office to schedule the follow-up appointment.   If you need a refill on your cardiac medications before your next appointment, please call your pharmacy.  

## 2016-05-19 ENCOUNTER — Other Ambulatory Visit: Payer: Self-pay

## 2016-05-19 ENCOUNTER — Other Ambulatory Visit: Payer: Self-pay | Admitting: *Deleted

## 2016-05-19 MED ORDER — METOPROLOL SUCCINATE ER 25 MG PO TB24: 12.5000 mg | ORAL_TABLET | Freq: Every day | ORAL | 2 refills | Status: DC

## 2016-05-19 NOTE — Telephone Encounter (Signed)
Patients husband, Caffie Damme left a msg on the refill vm stating that his wife needs refills through her new mail order pharmacy. Call back number provided was [phone]. Thanks, MI

## 2016-05-19 NOTE — Telephone Encounter (Signed)
   Disp Refills Start End   metoprolol succinate (TOPROL-XL) 25 MG 24 hr tablet 45 tablet 2 05/19/2016    Sig - Route: Take 0.5 tablets (12.5 mg total) by mouth daily. - Oral   E-Prescribing Status: Receipt confirmed by pharmacy (05/19/2016 2:25 PM EST)   [place], [place] patient's husband and notified him Rx was already sent in to new mail order pharmacy

## 2016-05-24 ENCOUNTER — Other Ambulatory Visit: Payer: Self-pay | Admitting: Cardiology

## 2016-05-24 NOTE — Telephone Encounter (Signed)
Rx(s) sent to pharmacy electronically.  

## 2016-12-19 ENCOUNTER — Other Ambulatory Visit: Payer: Self-pay | Admitting: Cardiology

## 2017-01-02 ENCOUNTER — Telehealth: Payer: Self-pay | Admitting: Cardiology

## 2017-01-02 ENCOUNTER — Other Ambulatory Visit: Payer: Self-pay

## 2017-01-02 MED ORDER — METOPROLOL SUCCINATE ER 25 MG PO TB24: 12.5000 mg | ORAL_TABLET | Freq: Every day | ORAL | 2 refills | Status: DC

## 2017-01-02 NOTE — Telephone Encounter (Signed)
New message      *STAT* If patient is at the pharmacy, call can be transferred to refill team.   1. Which medications need to be refilled? (please list name of each medication and dose if known)   metoprolol succinate (TOPROL-XL) 25 MG 24 hr tablet Take 0.5 tablets (12.5 mg total) by mouth daily.     2. Which pharmacy/location (including street and city if local pharmacy) is medication to be sent to? [facility]  3. Do they need a 30 day or 90 day supply?  [place]

## 2017-01-13 ENCOUNTER — Telehealth: Payer: Self-pay | Admitting: Cardiology

## 2017-01-13 NOTE — Telephone Encounter (Signed)
Patient of Dr. Stanford Breed  Reporting very brief (1-2 seconds) dizziness x2-3 days when standing from a seated position. [place] Reports she's not been drinking a lot of water, but drinking a lot of tomato juice and coffee. She does not have a BP cuff at her camper.   I recommended her to increase her water intake, drink other fluids in moderation. Advised to call at any point if she has any new symptoms or symptoms become worse. Also recommended to follow BP when she returns home and call if she has concerns r/t readings. Pt verbalized understanding and thanks.

## 2017-01-13 NOTE — Telephone Encounter (Signed)
New message       Pt states that she is dizzy going from a sitting to a standing position.  No other symptoms.  Pt has not taken her bp.  She is currently out of town.  Ok to leave message on tracfone today.  After today, contact pt at [phone]

## 2017-05-15 DIAGNOSIS — M859 Disorder of bone density and structure, unspecified: Secondary | ICD-10-CM | POA: Diagnosis not present

## 2017-05-25 NOTE — Progress Notes (Signed)
HPI: FU AVR and CAD. Echocardiogram in May 2015 demonstrated severe aortic stenosis. She was symptomatic and referred for cardiac catheterization. This demonstrated 3 vessel CAD. She was referred for CABG and AVR. She was admitted 6/15 and underwent CABG (LIMA-LAD, SVG-D1, SVG-D2, SVG-OM1) and bioprosthetic AVR (23 mm Edwards magna-ease pericardial valve). Note preoperative carotid Dopplers May 2015 showed bilateral 1-39% stenosis. Last echocardiogram January 2017 showed normal LV function, grade 1 diastolic dysfunction, bioprosthetic aortic valve with mean gradient 9 mmHg and moderate left atrial enlargement. Since last seen, the patient denies any dyspnea on exertion, orthopnea, PND, pedal edema, palpitations, syncope or chest pain.   Current Outpatient Medications  Medication Sig Dispense Refill  . Ascorbic Acid (VITAMIN C) POWD Take by mouth. Taking 1/4 teaspoon daily    . aspirin 81 MG tablet Take 81 mg by mouth daily.    . cholecalciferol (VITAMIN D) 1000 UNITS tablet Take 2,000 Units by mouth daily.     Marland Kitchen CINNAMON PO Take by mouth. Takes 1/4 teaspoon daily    . estradiol (ESTRACE) 0.5 MG tablet Take 0.5 mg by mouth daily before breakfast.     . folic acid (FOLVITE) 174 MCG tablet Take 400 mcg by mouth daily.    . Glucosamine-Chondroitin (GLUCOSAMINE CHONDR COMPLEX PO) Take 1 tablet by mouth 2 (two) times daily.    Marland Kitchen ibuprofen (ADVIL,MOTRIN) 200 MG tablet Take 200 mg by mouth every 6 (six) hours as needed for headache or mild pain.    Marland Kitchen levothyroxine (SYNTHROID, LEVOTHROID) 50 MCG tablet Take 50 mcg by mouth daily before breakfast.    . lovastatin (MEVACOR) 40 MG tablet Take 40 mg by mouth at bedtime. Per Dr. Osborne Casco    . metoprolol succinate (TOPROL-XL) 25 MG 24 hr tablet Take 0.5 tablets (12.5 mg total) by mouth daily. 45 tablet 2  . Multiple Vitamins-Minerals (VITA-MIN PO) Take 75 mg by mouth daily.     Marland Kitchen omega-3 acid ethyl esters (LOVAZA) 1 G capsule Take 1 g by mouth 2 (two)  times daily.    . vitamin E 400 UNIT capsule Take 400 Units by mouth daily.     No current facility-administered medications for this visit.      Past Medical History:  Diagnosis Date  . Allergy   . Aortic stenosis    s/p AVR w/ 23 mm Edward Magna-ease Pericardial Valve  . Arthritis    hands & knee- L   . Dysrhythmia    when taking antihistamines  . Heart murmur   . History of echocardiogram    Echo (8/15): EF 55-60%, normal wall motion, grade 1 diastolic dysfunction, AVR okay (mean 10 mm Hg), normal RV function, moderate TR  . Osteoporosis   . Palpitations   . Unspecified hypothyroidism     Past Surgical History:  Procedure Laterality Date  . ABDOMINAL HYSTERECTOMY    . AORTIC VALVE REPLACEMENT N/A 09/24/2013   Procedure: AORTIC VALVE REPLACEMENT (AVR) using Pericardial Tissue Valve (size 68mm).;  Surgeon: Gaye Pollack, MD;  Location: [facility];  Service: Open Heart Surgery;  Laterality: N/A;  . APPENDECTOMY     at time of hysterectomy  . bilateral knee surgery  35 yrs. ago   cartilage removed from knees  . CARDIAC CATHETERIZATION    . CARDIAC VALVE REPLACEMENT    . CORONARY ARTERY BYPASS GRAFT N/A 09/24/2013   Procedure: CORONARY ARTERY BYPASS GRAFTING (CABG) x4: LIMA to LAD, SVG to Diagonal 1, SVG to Diagonal 2, Svg to  OM 1.;  Surgeon: Gaye Pollack, MD;  Location: [facility];  Service: Open Heart Surgery;  Laterality: N/A;  . FINGER SURGERY     left hand  . INJECTION KNEE Left 03/04/2013   Procedure: left KNEE cortisone INJECTION;  Surgeon: Gearlean Alf, MD;  Location: [facility];  Service: Orthopedics;  Laterality: Left;  . INTRAOPERATIVE TRANSESOPHAGEAL ECHOCARDIOGRAM N/A 09/24/2013   Procedure: INTRAOPERATIVE TRANSESOPHAGEAL ECHOCARDIOGRAM;  Surgeon: Gaye Pollack, MD;  Location: [facility] OR;  Service: Open Heart Surgery;  Laterality: N/A;  . LEFT AND RIGHT HEART CATHETERIZATION WITH CORONARY ANGIOGRAM N/A 09/17/2013   Procedure: LEFT AND RIGHT HEART CATHETERIZATION WITH CORONARY  ANGIOGRAM;  Surgeon: Blane Ohara, MD;  Location: [facility] CATH LAB;  Service: Cardiovascular;  Laterality: N/A;  . TONSILLECTOMY    . TOTAL KNEE ARTHROPLASTY Right 03/04/2013   Procedure: RIGHT TOTAL KNEE ARTHROPLASTY;  Surgeon: Gearlean Alf, MD;  Location: [facility];  Service: Orthopedics;  Laterality: Right;    Social History   Socioeconomic History  . Marital status: Married    Spouse name: Not on file  . Number of children: Not on file  . Years of education: Not on file  . Highest education level: Not on file  Social Needs  . Financial resource strain: Not on file  . Food insecurity - worry: Not on file  . Food insecurity - inability: Not on file  . Transportation needs - medical: Not on file  . Transportation needs - non-medical: Not on file  Occupational History  . Not on file  Tobacco Use  . Smoking status: Never Smoker  . Smokeless tobacco: Never Used  Substance and Sexual Activity  . Alcohol use: Yes    Alcohol/week: 0.6 oz    Types: 1 Glasses of wine per week    Comment: 4 oz nightly red wine  . Drug use: No  . Sexual activity: Not on file  Other Topics Concern  . Not on file  Social History Narrative  . Not on file    Family History  Problem Relation Age of Onset  . Heart disease Mother   . Cancer Father     ROS: arthralgias and leg pain but no fevers or chills, productive cough, hemoptysis, dysphasia, odynophagia, melena, hematochezia, dysuria, hematuria, rash, seizure activity, orthopnea, PND, pedal edema, claudication. Remaining systems are negative.  Physical Exam: Well-developed well-nourished in no acute distress.  Skin is warm and dry.  HEENT is normal.  Neck is supple.  Chest is clear to auscultation with normal expansion.  Cardiovascular exam is regular rate and rhythm.  Abdominal exam nontender or distended. No masses palpated. Extremities show no edema. neuro grossly intact  ECG-normal sinus rhythm at a rate of 65.  Left axis deviation.   Personally reviewed  A/P  1 coronary artery disease status post coronary artery bypass and graft-patient is doing well with no chest pain. Continue medical therapy with ASA and statin.  2 hyperlipidemia-continue statin. She did not tolerate higher doses previously. Lipids and liver monitored by primary care.  3 status post aortic valve replacement-SBE prophylaxis.  No new symptoms or murmurs.  4 palpitations-are reasonably well controlled.  Continue beta-blocker.  [name], MD

## 2017-06-02 ENCOUNTER — Encounter: Payer: Self-pay | Admitting: Cardiology

## 2017-06-02 ENCOUNTER — Ambulatory Visit: Payer: Medicare HMO | Admitting: Cardiology

## 2017-06-02 VITALS — BP 122/64 | HR 65 | Ht 63.0 in | Wt 125.0 lb

## 2017-06-02 DIAGNOSIS — E7800 Pure hypercholesterolemia, unspecified: Secondary | ICD-10-CM

## 2017-06-02 DIAGNOSIS — I2510 Atherosclerotic heart disease of native coronary artery without angina pectoris: Secondary | ICD-10-CM | POA: Diagnosis not present

## 2017-06-02 DIAGNOSIS — Z953 Presence of xenogenic heart valve: Secondary | ICD-10-CM | POA: Diagnosis not present

## 2017-06-02 NOTE — Patient Instructions (Signed)
Your physician wants you to follow-up in: ONE YEAR WITH DR CRENSHAW You will receive a reminder letter in the mail two months in advance. If you don't receive a letter, please call our office to schedule the follow-up appointment.   If you need a refill on your cardiac medications before your next appointment, please call your pharmacy.  

## 2017-06-15 DIAGNOSIS — E785 Hyperlipidemia, unspecified: Secondary | ICD-10-CM | POA: Diagnosis not present

## 2017-06-15 DIAGNOSIS — Z951 Presence of aortocoronary bypass graft: Secondary | ICD-10-CM | POA: Diagnosis not present

## 2017-06-15 DIAGNOSIS — Z96651 Presence of right artificial knee joint: Secondary | ICD-10-CM | POA: Diagnosis not present

## 2017-06-15 DIAGNOSIS — Z952 Presence of prosthetic heart valve: Secondary | ICD-10-CM | POA: Diagnosis not present

## 2017-06-15 DIAGNOSIS — Z6821 Body mass index (BMI) 21.0-21.9, adult: Secondary | ICD-10-CM | POA: Diagnosis not present

## 2017-06-15 DIAGNOSIS — I10 Essential (primary) hypertension: Secondary | ICD-10-CM | POA: Diagnosis not present

## 2017-06-15 DIAGNOSIS — Z90710 Acquired absence of both cervix and uterus: Secondary | ICD-10-CM | POA: Diagnosis not present

## 2017-06-15 DIAGNOSIS — E039 Hypothyroidism, unspecified: Secondary | ICD-10-CM | POA: Diagnosis not present

## 2017-06-15 DIAGNOSIS — M170 Bilateral primary osteoarthritis of knee: Secondary | ICD-10-CM | POA: Diagnosis not present

## 2017-07-03 DIAGNOSIS — Z1231 Encounter for screening mammogram for malignant neoplasm of breast: Secondary | ICD-10-CM | POA: Diagnosis not present

## 2017-10-19 ENCOUNTER — Telehealth: Payer: Self-pay | Admitting: Cardiology

## 2017-10-19 NOTE — Telephone Encounter (Signed)
New Message    Pt c/o BP issue:  1. What are your last 5 BP readings? 108/66 2. Are you having any other symptoms (ex. Dizziness, headache, blurred vision, passed out)? Lightheaded  3. What is your medication issue? None  Patient is calling because her BP is low and she is feeling lightheaded

## 2017-10-19 NOTE — Telephone Encounter (Signed)
Pt called as she is concerned about having occasional blurred vision, weakness, and dizziness. She is worried it could be her valve. Pt stated it does not happen regularly but when it does she states it comes on suddenly and only last a few moments. She does not pass out or have any palpitations.  Pt stated from 4/1-10/31 [place] lot. She state it happened the other day she was sitting on the porch, and after drinking some water she felt better. She stated she saw a PA and they told her she was having dehydration and she is trying to do better with her fluid intake. Breakfast: OJ/H20,Coffee Lunch: H20 or milk Dinner: H20 and 2 oz of Red wine. Pt encouraged to drink more in between meals and drink some Gatorade throughout the day [place] Also, encouraged pt that when this happens to keep a log of when and how often it happens and to check her BP at that time.  If she notices that it is happening more frequently she can feel free to give [place] a call. She verbalized understanding, no additional questions a this time.

## 2017-10-23 DIAGNOSIS — M25841 Other specified joint disorders, right hand: Secondary | ICD-10-CM | POA: Diagnosis not present

## 2017-10-23 DIAGNOSIS — M19041 Primary osteoarthritis, right hand: Secondary | ICD-10-CM | POA: Diagnosis not present

## 2017-10-23 DIAGNOSIS — R229 Localized swelling, mass and lump, unspecified: Secondary | ICD-10-CM | POA: Diagnosis not present

## 2017-10-23 DIAGNOSIS — M6740 Ganglion, unspecified site: Secondary | ICD-10-CM | POA: Diagnosis not present

## 2017-11-13 ENCOUNTER — Telehealth: Payer: Self-pay | Admitting: Cardiology

## 2017-11-13 MED ORDER — METOPROLOL SUCCINATE ER 25 MG PO TB24: 12.5000 mg | ORAL_TABLET | Freq: Every day | ORAL | 3 refills | Status: DC

## 2017-11-13 NOTE — Telephone Encounter (Signed)
New Message     *STAT* If patient is at the pharmacy, call can be transferred to refill team.   1. Which medications need to be refilled? (please list name of each medication and dose if known) metoprolol succinate (TOPROL-XL) 25 MG 24 hr tablet 2. Which pharmacy/location (including street and city if local pharmacy) is medication to be sent to?[place], [place]   3. Do they need a 30 day or 90 day supply? [place]

## 2017-11-13 NOTE — Telephone Encounter (Signed)
Spoke with pt and advised that a new prescription for metoprolol to [facility].

## 2017-11-24 DIAGNOSIS — E7800 Pure hypercholesterolemia, unspecified: Secondary | ICD-10-CM | POA: Diagnosis not present

## 2017-11-24 DIAGNOSIS — M859 Disorder of bone density and structure, unspecified: Secondary | ICD-10-CM | POA: Diagnosis not present

## 2017-11-24 DIAGNOSIS — E038 Other specified hypothyroidism: Secondary | ICD-10-CM | POA: Diagnosis not present

## 2017-11-24 DIAGNOSIS — R82998 Other abnormal findings in urine: Secondary | ICD-10-CM | POA: Diagnosis not present

## 2017-11-27 DIAGNOSIS — M859 Disorder of bone density and structure, unspecified: Secondary | ICD-10-CM | POA: Diagnosis not present

## 2017-11-27 DIAGNOSIS — D692 Other nonthrombocytopenic purpura: Secondary | ICD-10-CM | POA: Diagnosis not present

## 2017-11-27 DIAGNOSIS — I25810 Atherosclerosis of coronary artery bypass graft(s) without angina pectoris: Secondary | ICD-10-CM | POA: Diagnosis not present

## 2017-11-27 DIAGNOSIS — Z952 Presence of prosthetic heart valve: Secondary | ICD-10-CM | POA: Diagnosis not present

## 2017-11-27 DIAGNOSIS — Z0000 Encounter for general adult medical examination without abnormal findings: Secondary | ICD-10-CM | POA: Diagnosis not present

## 2017-11-27 DIAGNOSIS — I119 Hypertensive heart disease without heart failure: Secondary | ICD-10-CM | POA: Diagnosis not present

## 2017-11-27 DIAGNOSIS — E7800 Pure hypercholesterolemia, unspecified: Secondary | ICD-10-CM | POA: Diagnosis not present

## 2017-11-27 DIAGNOSIS — E038 Other specified hypothyroidism: Secondary | ICD-10-CM | POA: Diagnosis not present

## 2017-11-27 DIAGNOSIS — J3089 Other allergic rhinitis: Secondary | ICD-10-CM | POA: Diagnosis not present

## 2018-04-11 ENCOUNTER — Telehealth: Payer: Self-pay

## 2018-04-11 NOTE — Telephone Encounter (Signed)
Primary Cardiologist: [facility] Pre-operative Risk Assessment    Request for surgical clearance:  1. What type of surgery is being performed? Excision Cyst Debridement DIP joint right ring finger  2. When is this surgery scheduled? 05/17/18   3. What type of clearance is required (medical clearance vs. Pharmacy clearance to hold med vs. Both)? Both  4. Are there any medications that need to be held prior to surgery and how long?Not listed   5. Practice name and name of physician performing surgery? [facility] [place])   6. What is your office phone number [phone]    7.   What is your office fax number [phone]  8.   Anesthesia type (None, local, MAC, general) ?  IV Regional Forearm Block   Lamar Laundry 04/11/2018, 1:09 PM  _________________________________________________________________   (provider comments below)

## 2018-04-13 NOTE — Telephone Encounter (Signed)
   Primary Cardiologist: [name], MD  Chart reviewed as part of pre-operative protocol coverage. Given past medical history and time since last visit, based on ACC/AHA guidelines, Linda Parker would be at acceptable risk for the planned procedure without further cardiovascular testing.   I will route this recommendation to the requesting party via Epic fax function and remove from pre-op pool.  Please call with questions.  Lyda Jester, PA-C 04/13/2018, 4:12 PM

## 2018-05-07 ENCOUNTER — Telehealth: Payer: Self-pay | Admitting: Cardiology

## 2018-05-07 NOTE — Telephone Encounter (Signed)
Agree with plan Chamaine Stankus  

## 2018-05-07 NOTE — Telephone Encounter (Signed)
New message     Pt c/o swelling: STAT is pt has developed SOB within 24 hours  1) How much weight have you gained and in what time span? No   2) If swelling, where is the swelling located? Ankles, calfs 3/4 of an inch   3) Are you currently taking a fluid pill? no  4) Are you currently SOB? Come and goes   5) Do you have a log of your daily weights (if so, list)? 121 or 122 . That's it   6) Have you gained 3 pounds in a day or 5 pounds in a week? no  7) Have you traveled recently? No    Pt stated that she is very anxious.  Pt stated that her heart rate  has dropped 67 this morning. Pt also mentioned SOB but she thinks its her anxiety.

## 2018-05-07 NOTE — Telephone Encounter (Signed)
Returned pt call. Pt sts that she has been having increased swelling in her ankles bilaterally. Pt sts that the swelling is better in the morning and worse in the evening. She is up on her feet a lot during the day. She describes the swelling as minimal. She denies weigh gain, palpitations, she has occasional sob. She has compression stocking at home but has never worn them. Adv pt to wear the compression stocking daily and elevate her feet when sitting.  She normally takes a 1/2 tab of Metoprolol daily at bedtime. She has not taken any in 2-3 days because her heartrate has been running in the 65-70 bpm. She sts that this is low for her. Adv her that those readings are normal and of the normal range.  she is not symptomatic. Adv her to resume Metoprolol as prescribed and continue to monitor her BP and HR. She is scheduled to see Dr.Crenshaw in Feb. Adv pt that I will fwd an update to [facility] and we will call back if he has any additional recommendations. Pt agreeable with plan and verbalized understanding.

## 2018-05-10 ENCOUNTER — Encounter (HOSPITAL_BASED_OUTPATIENT_CLINIC_OR_DEPARTMENT_OTHER): Payer: Self-pay | Admitting: *Deleted

## 2018-05-10 ENCOUNTER — Other Ambulatory Visit: Payer: Self-pay

## 2018-05-10 NOTE — Progress Notes (Addendum)
Pt's pmh,  & cardiology notes reviewed with Dr Ambrose Pancoast. No further clearance needed prior to surgery. Pt to stay on her asprin 81mg . Dr Cyndia Diver aware and agreeable.

## 2018-05-15 ENCOUNTER — Other Ambulatory Visit: Payer: Self-pay | Admitting: Orthopedic Surgery

## 2018-05-17 ENCOUNTER — Ambulatory Visit (HOSPITAL_BASED_OUTPATIENT_CLINIC_OR_DEPARTMENT_OTHER): Payer: Medicare HMO | Admitting: Certified Registered"

## 2018-05-17 ENCOUNTER — Other Ambulatory Visit: Payer: Self-pay

## 2018-05-17 ENCOUNTER — Encounter (HOSPITAL_BASED_OUTPATIENT_CLINIC_OR_DEPARTMENT_OTHER)
Admission: RE | Disposition: A | Discharge status: Home / Self Care | Payer: Self-pay | Source: Home / Self Care | Attending: Orthopedic Surgery

## 2018-05-17 ENCOUNTER — Ambulatory Visit (HOSPITAL_BASED_OUTPATIENT_CLINIC_OR_DEPARTMENT_OTHER)
Admission: RE | Admit: 2018-05-17 | Discharge: 2018-05-17 | Disposition: A | Discharge status: Home / Self Care | Payer: Medicare HMO | Attending: Orthopedic Surgery | Admitting: Orthopedic Surgery

## 2018-05-17 ENCOUNTER — Encounter (HOSPITAL_BASED_OUTPATIENT_CLINIC_OR_DEPARTMENT_OTHER): Payer: Self-pay | Admitting: *Deleted

## 2018-05-17 DIAGNOSIS — M25741 Osteophyte, right hand: Secondary | ICD-10-CM | POA: Diagnosis not present

## 2018-05-17 DIAGNOSIS — D4989 Neoplasm of unspecified behavior of other specified sites: Secondary | ICD-10-CM | POA: Diagnosis not present

## 2018-05-17 DIAGNOSIS — M19041 Primary osteoarthritis, right hand: Secondary | ICD-10-CM | POA: Insufficient documentation

## 2018-05-17 DIAGNOSIS — M151 Heberden's nodes (with arthropathy): Secondary | ICD-10-CM | POA: Diagnosis not present

## 2018-05-17 DIAGNOSIS — Z951 Presence of aortocoronary bypass graft: Secondary | ICD-10-CM | POA: Diagnosis not present

## 2018-05-17 DIAGNOSIS — E039 Hypothyroidism, unspecified: Secondary | ICD-10-CM | POA: Diagnosis not present

## 2018-05-17 DIAGNOSIS — M67441 Ganglion, right hand: Secondary | ICD-10-CM | POA: Insufficient documentation

## 2018-05-17 DIAGNOSIS — R2231 Localized swelling, mass and lump, right upper limb: Secondary | ICD-10-CM | POA: Diagnosis present

## 2018-05-17 DIAGNOSIS — Z952 Presence of prosthetic heart valve: Secondary | ICD-10-CM | POA: Insufficient documentation

## 2018-05-17 DIAGNOSIS — Z96651 Presence of right artificial knee joint: Secondary | ICD-10-CM | POA: Insufficient documentation

## 2018-05-17 DIAGNOSIS — I2510 Atherosclerotic heart disease of native coronary artery without angina pectoris: Secondary | ICD-10-CM | POA: Insufficient documentation

## 2018-05-17 DIAGNOSIS — M71341 Other bursal cyst, right hand: Secondary | ICD-10-CM | POA: Diagnosis not present

## 2018-05-17 HISTORY — PX: MASS EXCISION: SHX2000

## 2018-05-17 HISTORY — DX: Dizziness and giddiness: R42

## 2018-05-17 SURGERY — EXCISION MASS
Anesthesia: Regional | Site: Finger | Laterality: Right

## 2018-05-17 MED ORDER — PROMETHAZINE HCL 25 MG/ML IJ SOLN: 6.2500 mg | INTRAMUSCULAR | Status: DC | PRN

## 2018-05-17 MED ORDER — TRAMADOL HCL 50 MG PO TABS: 50.0000 mg | ORAL_TABLET | Freq: Four times a day (QID) | ORAL | 0 refills | Status: DC | PRN

## 2018-05-17 MED ORDER — CEFAZOLIN SODIUM-DEXTROSE 2-4 GM/100ML-% IV SOLN
2.0000 g | INTRAVENOUS | Status: AC
  Administered 2018-05-17: 2 g via INTRAVENOUS

## 2018-05-17 MED ORDER — ONDANSETRON HCL 4 MG/2ML IJ SOLN
INTRAMUSCULAR | Status: DC | PRN
  Administered 2018-05-17: 4 mg via INTRAVENOUS

## 2018-05-17 MED ORDER — FENTANYL CITRATE (PF) 100 MCG/2ML IJ SOLN
50.0000 ug | INTRAMUSCULAR | Status: DC | PRN
  Administered 2018-05-17: 25 ug via INTRAVENOUS

## 2018-05-17 MED ORDER — OXYCODONE HCL 5 MG/5ML PO SOLN: 5.0000 mg | Freq: Once | ORAL | Status: DC | PRN

## 2018-05-17 MED ORDER — PROPOFOL 500 MG/50ML IV EMUL
INTRAVENOUS | Status: DC | PRN
  Administered 2018-05-17: 75 ug/kg/min via INTRAVENOUS

## 2018-05-17 MED ORDER — HYDROMORPHONE HCL 1 MG/ML IJ SOLN: 0.2500 mg | INTRAMUSCULAR | Status: DC | PRN

## 2018-05-17 MED ORDER — CEFAZOLIN SODIUM-DEXTROSE 2-4 GM/100ML-% IV SOLN
INTRAVENOUS | Status: AC
  Filled 2018-05-17: qty 100

## 2018-05-17 MED ORDER — LIDOCAINE HCL (CARDIAC) PF 100 MG/5ML IV SOSY
PREFILLED_SYRINGE | INTRAVENOUS | Status: DC | PRN
  Administered 2018-05-17: 30 mg via INTRAVENOUS

## 2018-05-17 MED ORDER — LIDOCAINE HCL (PF) 0.5 % IJ SOLN
INTRAMUSCULAR | Status: DC | PRN
  Administered 2018-05-17: 30 mL via INTRAVENOUS

## 2018-05-17 MED ORDER — BUPIVACAINE HCL (PF) 0.25 % IJ SOLN
INTRAMUSCULAR | Status: DC | PRN
  Administered 2018-05-17: 8 mL

## 2018-05-17 MED ORDER — LACTATED RINGERS IV SOLN
INTRAVENOUS | Status: DC
  Administered 2018-05-17: 10:00:00 via INTRAVENOUS

## 2018-05-17 MED ORDER — OXYCODONE HCL 5 MG PO TABS: 5.0000 mg | ORAL_TABLET | Freq: Once | ORAL | Status: DC | PRN

## 2018-05-17 MED ORDER — MIDAZOLAM HCL 2 MG/2ML IJ SOLN: 1.0000 mg | INTRAMUSCULAR | Status: DC | PRN

## 2018-05-17 MED ORDER — CHLORHEXIDINE GLUCONATE 4 % EX LIQD: 60.0000 mL | Freq: Once | CUTANEOUS | Status: DC

## 2018-05-17 MED ORDER — SCOPOLAMINE 1 MG/3DAYS TD PT72: 1.0000 | MEDICATED_PATCH | Freq: Once | TRANSDERMAL | Status: DC | PRN

## 2018-05-17 MED ORDER — FENTANYL CITRATE (PF) 100 MCG/2ML IJ SOLN
INTRAMUSCULAR | Status: AC
  Filled 2018-05-17: qty 2

## 2018-05-17 SURGICAL SUPPLY — 47 items
BANDAGE COBAN STERILE 2 (GAUZE/BANDAGES/DRESSINGS) IMPLANT
BLADE SURG 15 STRL LF DISP TIS (BLADE) ×1 IMPLANT
BLADE SURG 15 STRL SS (BLADE) ×3
BNDG CMPR 9X4 STRL LF SNTH (GAUZE/BANDAGES/DRESSINGS)
BNDG COHESIVE 1X5 TAN STRL LF (GAUZE/BANDAGES/DRESSINGS) IMPLANT
BNDG COHESIVE 3X5 TAN STRL LF (GAUZE/BANDAGES/DRESSINGS) IMPLANT
BNDG ESMARK 4X9 LF (GAUZE/BANDAGES/DRESSINGS) IMPLANT
BNDG GAUZE ELAST 4 BULKY (GAUZE/BANDAGES/DRESSINGS) IMPLANT
CHLORAPREP W/TINT 26ML (MISCELLANEOUS) ×3 IMPLANT
CORD BIPOLAR FORCEPS 12FT (ELECTRODE) ×3 IMPLANT
COVER BACK TABLE 60X90IN (DRAPES) ×3 IMPLANT
COVER MAYO STAND STRL (DRAPES) ×3 IMPLANT
COVER WAND RF STERILE (DRAPES) IMPLANT
CUFF TOURNIQUET SINGLE 18IN (TOURNIQUET CUFF) ×2 IMPLANT
DECANTER SPIKE VIAL GLASS SM (MISCELLANEOUS) IMPLANT
DRAIN PENROSE 1/2X12 LTX STRL (WOUND CARE) IMPLANT
DRAPE EXTREMITY T 121X128X90 (DISPOSABLE) ×3 IMPLANT
DRAPE SURG 17X23 STRL (DRAPES) ×3 IMPLANT
GAUZE SPONGE 4X4 12PLY STRL (GAUZE/BANDAGES/DRESSINGS) ×3 IMPLANT
GAUZE XEROFORM 1X8 LF (GAUZE/BANDAGES/DRESSINGS) ×3 IMPLANT
GLOVE BIO SURGEON STRL SZ 6.5 (GLOVE) ×1 IMPLANT
GLOVE BIO SURGEONS STRL SZ 6.5 (GLOVE) ×1
GLOVE BIOGEL PI IND STRL 7.0 (GLOVE) IMPLANT
GLOVE BIOGEL PI IND STRL 8.5 (GLOVE) ×1 IMPLANT
GLOVE BIOGEL PI INDICATOR 7.0 (GLOVE) ×4
GLOVE BIOGEL PI INDICATOR 8.5 (GLOVE) ×2
GLOVE SURG ORTHO 8.0 STRL STRW (GLOVE) ×3 IMPLANT
GOWN STRL REUS W/ TWL LRG LVL3 (GOWN DISPOSABLE) ×1 IMPLANT
GOWN STRL REUS W/TWL LRG LVL3 (GOWN DISPOSABLE) ×3
GOWN STRL REUS W/TWL XL LVL3 (GOWN DISPOSABLE) ×3 IMPLANT
NDL PRECISIONGLIDE 27X1.5 (NEEDLE) ×1 IMPLANT
NDL SAFETY ECLIPSE 18X1.5 (NEEDLE) IMPLANT
NEEDLE HYPO 18GX1.5 SHARP (NEEDLE)
NEEDLE PRECISIONGLIDE 27X1.5 (NEEDLE) ×3 IMPLANT
NS IRRIG 1000ML POUR BTL (IV SOLUTION) ×3 IMPLANT
PACK BASIN DAY SURGERY FS (CUSTOM PROCEDURE TRAY) ×3 IMPLANT
PAD CAST 3X4 CTTN HI CHSV (CAST SUPPLIES) IMPLANT
PADDING CAST COTTON 3X4 STRL (CAST SUPPLIES)
SPLINT PLASTER CAST XFAST 3X15 (CAST SUPPLIES) IMPLANT
SPLINT PLASTER XTRA FASTSET 3X (CAST SUPPLIES)
STOCKINETTE 4X48 STRL (DRAPES) ×3 IMPLANT
SUT ETHILON 4 0 PS 2 18 (SUTURE) ×3 IMPLANT
SUT VIC AB 4-0 P2 18 (SUTURE) IMPLANT
SYR BULB 3OZ (MISCELLANEOUS) ×3 IMPLANT
SYR CONTROL 10ML LL (SYRINGE) ×3 IMPLANT
TOWEL GREEN STERILE FF (TOWEL DISPOSABLE) ×3 IMPLANT
UNDERPAD 30X30 (UNDERPADS AND DIAPERS) ×3 IMPLANT

## 2018-05-17 NOTE — Anesthesia Procedure Notes (Signed)
Anesthesia Regional Block: Bier block (IV Regional)   Pre-Anesthetic Checklist: ,, timeout performed, Correct Patient, Correct Site, Correct Laterality, Correct Procedure,, site marked, surgical consent,, at surgeon's request  Laterality: Right     Needles:  Injection technique: Single-shot  Needle Type: Other      Needle Gauge: 20     Additional Needles:   Procedures:,,,,, intact distal pulses, Esmarch exsanguination, single tourniquet utilized,  Narrative:   Performed by: Personally       

## 2018-05-17 NOTE — Discharge Instructions (Addendum)

## 2018-05-17 NOTE — Transfer of Care (Signed)
Immediate Anesthesia Transfer of Care Note  Patient: Linda Parker  Procedure(s) Performed: EXCISION CYST RIGHT RING FINGER, DEBRIDEMENT DISTAL INTERPHALANGEAL RIGHT FINGER (Right Finger)  Patient Location: PACU  Anesthesia Type:Bier block  Level of Consciousness: awake, alert , oriented and patient cooperative  Airway & Oxygen Therapy: Patient Spontanous Breathing and Patient connected to face mask oxygen  Post-op Assessment: Report given to RN and Post -op Vital signs reviewed and stable  Post vital signs: Reviewed and stable  Last Vitals:  Vitals Value Taken Time  BP    Temp    Pulse 72 05/17/2018 10:25 AM  Resp    SpO2 100 % 05/17/2018 10:25 AM  Vitals shown include unvalidated device data.  Last Pain:  Vitals:   05/17/18 0918  TempSrc: Oral  PainSc: 0-No pain         Complications: No apparent anesthesia complications

## 2018-05-17 NOTE — Anesthesia Preprocedure Evaluation (Signed)
Anesthesia Evaluation  Patient identified by MRN, date of birth, ID band Patient awake    Reviewed: Allergy & Precautions, H&P , NPO status , Patient's Chart, lab work & pertinent test results  History of Anesthesia Complications Negative for: history of anesthetic complications  Airway Mallampati: II  TM Distance: >3 FB Neck ROM: Full    Dental  (+) Teeth Intact, Dental Advisory Given   Pulmonary neg pulmonary ROS,    Pulmonary exam normal breath sounds clear to auscultation       Cardiovascular Exercise Tolerance: Poor + angina with exertion + CAD (severe 3v ASCADz)  + dysrhythmias Atrial Fibrillation + Valvular Problems/Murmurs (severe AS, AVA 0.7cm2) AS  Rhythm:Regular Rate:Normal  5/15 ECHO: EF 6-65%, mild LVH with grade 1 diastolic dysfunction, mod-severe AS with mild AI   Neuro/Psych negative neurological ROS     GI/Hepatic negative GI ROS, Neg liver ROS,   Endo/Other  Hypothyroidism   Renal/GU negative Renal ROS     Musculoskeletal  (+) Arthritis , Osteoarthritis,    Abdominal   Peds  Hematology negative hematology ROS (+)   Anesthesia Other Findings   Reproductive/Obstetrics                             Anesthesia Physical  Anesthesia Plan  ASA: III  Anesthesia Plan: Bier Block and Bier Block-LIDOCAINE ONLY   Post-op Pain Management:    Induction: Intravenous  PONV Risk Score and Plan: 2 and Ondansetron and Midazolam  Airway Management Planned: Simple Face Mask  Additional Equipment:   Intra-op Plan:   Post-operative Plan:   Informed Consent: I have reviewed the patients History and Physical, chart, labs and discussed the procedure including the risks, benefits and alternatives for the proposed anesthesia with the patient or authorized representative who has indicated his/her understanding and acceptance.     Dental advisory given  Plan Discussed with: CRNA  and Surgeon  Anesthesia Plan Comments:         Anesthesia Quick Evaluation

## 2018-05-17 NOTE — H&P (Signed)
Linda Parker is an 83 y.o. female.   Chief Complaint: mass right ring finger HPI: Akua is an 83 year old right-hand-dominant female who comes in complaining of a mass on the right ring finger distal phalangeal joint which is been present approximately 3 months. She recalls no history of injury. She states it hurts only if she hits here and gets a burning pain. Moderate in nature. She has significant arthritis of all of her fingers with PIP/DIP joint involvement. She has had mucoid cyst excised by myself in the past. She was last seen in 2010. She has a history of thyroid problems arthritis no history of diabetes or gout family history is positive arthritis negative for diabetes thyroid problems and gout.   Past Medical History:  Diagnosis Date  . Allergy   . Aortic stenosis    s/p AVR w/ 23 mm Edward Magna-ease Pericardial Valve  . Arthritis    hands & knee- L   . Dysrhythmia    when taking antihistamines  . Heart murmur   . History of echocardiogram    Echo (8/15): EF 55-60%, normal wall motion, grade 1 diastolic dysfunction, AVR okay (mean 10 mm Hg), normal RV function, moderate TR  . Osteoporosis   . Palpitations   . Unspecified hypothyroidism   . Vertigo     Past Surgical History:  Procedure Laterality Date  . ABDOMINAL HYSTERECTOMY    . AORTIC VALVE REPLACEMENT N/A 09/24/2013   Procedure: AORTIC VALVE REPLACEMENT (AVR) using Pericardial Tissue Valve (size 35mm).;  Surgeon: Gaye Pollack, MD;  [facility];  Service: Open Heart Surgery;  Laterality: N/A;  . APPENDECTOMY     at time of hysterectomy  . bilateral knee surgery  35 yrs. ago   cartilage removed from knees  . CARDIAC CATHETERIZATION    . CARDIAC VALVE REPLACEMENT    . CORONARY ARTERY BYPASS GRAFT N/A 09/24/2013   Procedure: CORONARY ARTERY BYPASS GRAFTING (CABG) x4: LIMA to LAD, SVG to Diagonal 1, SVG to Diagonal 2, Svg to OM 1.;  Surgeon: Gaye Pollack, MD;  Location: [facility];  Service: Open Heart Surgery;   Laterality: N/A;  . FINGER SURGERY     left hand  . INJECTION KNEE Left 03/04/2013   Procedure: left KNEE cortisone INJECTION;  Surgeon: Gearlean Alf, MD;  Location: [facility];  Service: Orthopedics;  Laterality: Left;  . INTRAOPERATIVE TRANSESOPHAGEAL ECHOCARDIOGRAM N/A 09/24/2013   Procedure: INTRAOPERATIVE TRANSESOPHAGEAL ECHOCARDIOGRAM;  Surgeon: Gaye Pollack, MD;  Location: [facility] OR;  Service: Open Heart Surgery;  Laterality: N/A;  . LEFT AND RIGHT HEART CATHETERIZATION WITH CORONARY ANGIOGRAM N/A 09/17/2013   Procedure: LEFT AND RIGHT HEART CATHETERIZATION WITH CORONARY ANGIOGRAM;  Surgeon: Blane Ohara, MD;  Location: [facility] CATH LAB;  Service: Cardiovascular;  Laterality: N/A;  . TONSILLECTOMY    . TOTAL KNEE ARTHROPLASTY Right 03/04/2013   Procedure: RIGHT TOTAL KNEE ARTHROPLASTY;  Surgeon: Gearlean Alf, MD;  Location: [facility];  Service: Orthopedics;  Laterality: Right;    Family History  Problem Relation Age of Onset  . Heart disease Mother   . Cancer Father    Social History:  reports that she has never smoked. She has never used smokeless tobacco. She reports current alcohol use of about 1.0 standard drinks of alcohol per week. She reports that she does not use drugs.  Allergies:  Allergies  Allergen Reactions  . Erythromycin Nausea Only    Feels like she is dying/out of her body  . Monosodium  Glutamate Other (See Comments)    Migraine headache   . Nitrates, Organic     headache  . Sulfonamide Derivatives Nausea Only    Feels like she is dying/out of her body  . Ciprofloxacin Palpitations    Feels out of her body experience  . Versed [Midazolam] Other (See Comments)    Pt experienced forgetfulness for days after and requests we do not use.    No medications prior to admission.    No results found for this or any previous visit (from the past 48 hour(s)).  No results found.   Pertinent items are noted in HPI.  Height 5' 3.75" (1.619 m), weight 55.3  kg.  General appearance: alert, cooperative and appears stated age Head: Normocephalic, without obvious abnormality Neck: no adenopathy, no carotid bruit and no JVD Resp: clear to auscultation bilaterally Cardio: regular rate and rhythm, S1, S2 normal, no murmur, click, rub or gallop GI: soft, non-tender; bowel sounds normal; no masses,  no organomegaly Extremities: mass right ring finger Pulses: 2+ and symmetric Skin: Skin color, texture, turgor normal. No rashes or lesions Neurologic: Grossly normal Incision/Wound: na  Assessment/Plan Assessment:  Mucoid cyst, joint  Osteoarthritis of finger of right hand    Plan: Have discussed excision of the cyst debridement of the distal phalangeal joint with her. Pre-peri-and postoperative course are discussed along with risks and complications. She is aware that there is no guarantee to the surgery the possibility of infection recurrence injury to arteries nerves tendons complete relief of symptoms and dystrophy. Scheduled for excision mucoid cyst debridement distal phalangeal joint right ring finger as an outpatient under regional anesthesia.   Daryll Brod 05/17/2018, 5:09 AM

## 2018-05-17 NOTE — Anesthesia Procedure Notes (Signed)
Procedure Name: MAC Date/Time: 05/17/2018 10:07 AM Performed by: Signe Colt, CRNA Pre-anesthesia Checklist: Patient identified, Emergency Drugs available, Suction available, Patient being monitored and Timeout performed Patient Re-evaluated:Patient Re-evaluated prior to induction Oxygen Delivery Method: Simple face mask

## 2018-05-17 NOTE — Anesthesia Postprocedure Evaluation (Signed)
Anesthesia Post Note  Patient: Linda Parker  Procedure(s) Performed: EXCISION CYST RIGHT RING FINGER, DEBRIDEMENT DISTAL INTERPHALANGEAL RIGHT FINGER (Right Finger)     Patient location during evaluation: PACU Anesthesia Type: Bier Block Level of consciousness: awake and alert Pain management: pain level controlled Vital Signs Assessment: post-procedure vital signs reviewed and stable Respiratory status: spontaneous breathing, nonlabored ventilation and respiratory function stable Cardiovascular status: blood pressure returned to baseline and stable Postop Assessment: no apparent nausea or vomiting Anesthetic complications: no    Last Vitals:  Vitals:   05/17/18 1030 05/17/18 1045  BP: (!) 116/58 119/62  Pulse: 66 61  Resp: 11 16  Temp:    SpO2: 100% 97%    Last Pain:  Vitals:   05/17/18 1025  TempSrc:   PainSc: 0-No pain                 [name]

## 2018-05-17 NOTE — Brief Op Note (Signed)
05/17/2018  10:27 AM  PATIENT:  Era Bumpers  83 y.o. female  PRE-OPERATIVE DIAGNOSIS:  MUCOID TUMOR RIGHT RING FINGER, DEGENERATIVE JOINT DISEASE DISTAL INTERPHALANGEAL RIGHT RING FINGER  POST-OPERATIVE DIAGNOSIS:  MUCOID TUMOR RIGHT RING FINGER, DEGENERATIVE   PROCEDURE:  Procedure(s): EXCISION CYST RIGHT RING FINGER, DEBRIDEMENT DISTAL INTERPHALANGEAL RIGHT FINGER (Right)  SURGEON:  Surgeon(s) and Role:    * Daryll Brod, MD - Primary  PHYSICIAN ASSISTANT:   ASSISTANTS: none   ANESTHESIA:   local, regional and IV sedation  EBL: 49ml BLOOD ADMINISTERED:none  DRAINS: none   LOCAL MEDICATIONS USED:  BUPIVICAINE   SPECIMEN:  Excision  DISPOSITION OF SPECIMEN:  PATHOLOGY  COUNTS:  YES  TOURNIQUET:   Total Tourniquet Time Documented: Forearm (Right) - 21 minutes Total: Forearm (Right) - 21 minutes   DICTATION: .Dragon Dictation  PLAN OF CARE: Discharge to home after PACU  PATIENT DISPOSITION:  PACU - hemodynamically stable.

## 2018-05-17 NOTE — Op Note (Signed)
NAME: Linda Parker RECORD NO: [id number] DATE OF BIRTH: [birth year] FACILITY: [facility] LOCATION: [facility] PHYSICIAN: Wynonia Sours, MD   OPERATIVE REPORT   DATE OF PROCEDURE: 05/17/18    PREOPERATIVE DIAGNOSIS:   Mucoid tumor right ring finger   POSTOPERATIVE DIAGNOSIS: same  PROCEDURE:   Excision mucoid cyst with debridement distal phalangeal joint right ring finger   SURGEON: Daryll Brod, M.D.   ASSISTANT: none   ANESTHESIA:  Bier block with sedation plus local   INTRAVENOUS FLUIDS:  Per anesthesia flow sheet.   ESTIMATED BLOOD LOSS:  Minimal.   COMPLICATIONS:  None.   SPECIMENS:   Synovectomy osteophyte   TOURNIQUET TIME:    Total Tourniquet Time Documented: Forearm (Right) - 21 minutes Total: Forearm (Right) - 21 minutes    DISPOSITION:  Stable to PACU.   INDICATIONS: Patient is a 83 year old female with a history of a mass dorsal aspect of the distal interphalangeal joint right ring finger with grooving of the nail plate distally.  Like to have this excised with debridement..  She has undergone mucoid cyst excisions from other fingers.  Pre-peri-and postoperative course been discussed along with risk complications.  She is aware there is no guarantee to the surgery the possibility of infection recurrence injury to arteries nerves tendons complete relief symptoms distally.  Preoperative area the patient is seen the extremity marked by both patient and surgeon antibiotic given  OPERATIVE COURSE: Patient is brought to the operating room where form based IV regional anesthetic was carried out without difficulty under the direction of the anesthesia department.  She was prepped using ChloraPrep in a supine position with the right arm free.  A three-minute dry time was allowed timeout taken confirming patient procedure.  A metacarpal block was given quarter percent bupivacaine without epinephrine approximately 8 cc was used.  A curvilinear incision was  made over the distal phalangeal joint.  This carried down through subcutaneous tissue.  A tract was made to the system distally with blunt dissection.  A hemostatic rondure and house curette were used to remove the cyst.  The joint was then opened on its ulnar border.  The joint was enlarged with synovectomy performed osteophytes were removed from the middle phalanx.  Specimen was sent to pathology.  The wound was copiously irrigated with saline.  Skin was closed interrupted 4-0 nylon sutures.  A sterile compressive dressing splint to the finger applied.  Deflation of the tourniquet remaining fingers pink.  She was taken to the recovery room for observation in satisfactory condition.  She will be discharged home to return [place] in 1 week on Tylenol ibuprofen with Ultram for breakthrough.   Daryll Brod, MD Electronically signed, 05/17/18

## 2018-05-18 ENCOUNTER — Encounter (HOSPITAL_BASED_OUTPATIENT_CLINIC_OR_DEPARTMENT_OTHER): Payer: Self-pay | Admitting: Orthopedic Surgery

## 2018-06-01 NOTE — Progress Notes (Deleted)
HPI: FU AVR and CAD. Echocardiogram in May 2015 demonstrated severe aortic stenosis. She was symptomatic and referred for cardiac catheterization. This demonstrated 3 vessel CAD. She was referred for CABG and AVR. She was admitted 6/15 and underwent CABG (LIMA-LAD, SVG-D1, SVG-D2, SVG-OM1) and bioprosthetic AVR (23 mm Edwards magna-ease pericardial valve). Note preoperative carotid Dopplers May 2015 showed bilateral 1-39% stenosis. Last echocardiogram January 2017 showed normal LV function, grade 1 diastolic dysfunction, bioprosthetic aortic valve with mean gradient 9 mmHg and moderate left atrial enlargement. Since last seen,  Current Outpatient Medications  Medication Sig Dispense Refill  . Ascorbic Acid (VITAMIN C) POWD Take by mouth. Taking 1/4 teaspoon daily    . aspirin 81 MG tablet Take 81 mg by mouth daily.    . cholecalciferol (VITAMIN D) 1000 UNITS tablet Take 2,000 Units by mouth daily.     Marland Kitchen CINNAMON PO Take by mouth. Takes 1/4 teaspoon daily    . estradiol (ESTRACE) 0.5 MG tablet Take 0.5 mg by mouth daily before breakfast.     . folic acid (FOLVITE) 229 MCG tablet Take 400 mcg by mouth daily.    . Glucosamine-Chondroitin (GLUCOSAMINE CHONDR COMPLEX PO) Take 1 tablet by mouth 2 (two) times daily.    Marland Kitchen ibuprofen (ADVIL,MOTRIN) 200 MG tablet Take 200 mg by mouth every 6 (six) hours as needed for headache or mild pain.    Marland Kitchen levothyroxine (SYNTHROID, LEVOTHROID) 50 MCG tablet Take 50 mcg by mouth daily before breakfast.    . lovastatin (MEVACOR) 40 MG tablet Take 40 mg by mouth at bedtime. Per Dr. Osborne Casco    . metoprolol succinate (TOPROL-XL) 25 MG 24 hr tablet Take 0.5 tablets (12.5 mg total) by mouth daily. 90 tablet 3  . omega-3 acid ethyl esters (LOVAZA) 1 G capsule Take 1 g by mouth 2 (two) times daily.    . traMADol (ULTRAM) 50 MG tablet Take 1 tablet (50 mg total) by mouth every 6 (six) hours as needed. 20 tablet 0  . vitamin E 400 UNIT capsule Take 400 Units by mouth  daily.     No current facility-administered medications for this visit.      Past Medical History:  Diagnosis Date  . Allergy   . Aortic stenosis    s/p AVR w/ 23 mm Edward Magna-ease Pericardial Valve  . Arthritis    hands & knee- L   . Dysrhythmia    when taking antihistamines  . Heart murmur   . History of echocardiogram    Echo (8/15): EF 55-60%, normal wall motion, grade 1 diastolic dysfunction, AVR okay (mean 10 mm Hg), normal RV function, moderate TR  . Osteoporosis   . Palpitations   . Unspecified hypothyroidism   . Vertigo     Past Surgical History:  Procedure Laterality Date  . ABDOMINAL HYSTERECTOMY    . AORTIC VALVE REPLACEMENT N/A 09/24/2013   Procedure: AORTIC VALVE REPLACEMENT (AVR) using Pericardial Tissue Valve (size 47mm).;  Surgeon: Gaye Pollack, MD;  Location: [facility];  Service: Open Heart Surgery;  Laterality: N/A;  . APPENDECTOMY     at time of hysterectomy  . bilateral knee surgery  35 yrs. ago   cartilage removed from knees  . CARDIAC CATHETERIZATION    . CARDIAC VALVE REPLACEMENT    . CORONARY ARTERY BYPASS GRAFT N/A 09/24/2013   Procedure: CORONARY ARTERY BYPASS GRAFTING (CABG) x4: LIMA to LAD, SVG to Diagonal 1, SVG to Diagonal 2, Svg to OM 1.;  Surgeon:  Gaye Pollack, MD;  Location: [facility];  Service: Open Heart Surgery;  Laterality: N/A;  . FINGER SURGERY     left hand  . INJECTION KNEE Left 03/04/2013   Procedure: left KNEE cortisone INJECTION;  Surgeon: Gearlean Alf, MD;  Location: [facility];  Service: Orthopedics;  Laterality: Left;  . INTRAOPERATIVE TRANSESOPHAGEAL ECHOCARDIOGRAM N/A 09/24/2013   Procedure: INTRAOPERATIVE TRANSESOPHAGEAL ECHOCARDIOGRAM;  Surgeon: Gaye Pollack, MD;  Location: [facility] OR;  Service: Open Heart Surgery;  Laterality: N/A;  . LEFT AND RIGHT HEART CATHETERIZATION WITH CORONARY ANGIOGRAM N/A 09/17/2013   Procedure: LEFT AND RIGHT HEART CATHETERIZATION WITH CORONARY ANGIOGRAM;  Surgeon: Blane Ohara, MD;  Location: [facility] CATH  LAB;  Service: Cardiovascular;  Laterality: N/A;  . MASS EXCISION Right 05/17/2018   Procedure: EXCISION CYST RIGHT RING FINGER, DEBRIDEMENT DISTAL INTERPHALANGEAL RIGHT FINGER;  Surgeon: Daryll Brod, MD;  Location: [facility];  Service: Orthopedics;  Laterality: Right;  . TONSILLECTOMY    . TOTAL KNEE ARTHROPLASTY Right 03/04/2013   Procedure: RIGHT TOTAL KNEE ARTHROPLASTY;  Surgeon: Gearlean Alf, MD;  Location: [facility];  Service: Orthopedics;  Laterality: Right;    Social History   Socioeconomic History  . Marital status: Married    Spouse name: Not on file  . Number of children: Not on file  . Years of education: Not on file  . Highest education level: Not on file  Occupational History  . Not on file  Social Needs  . Financial resource strain: Not on file  . Food insecurity:    Worry: Not on file    Inability: Not on file  . Transportation needs:    Medical: Not on file    Non-medical: Not on file  Tobacco Use  . Smoking status: Never Smoker  . Smokeless tobacco: Never Used  Substance and Sexual Activity  . Alcohol use: Yes    Alcohol/week: 1.0 standard drinks    Types: 1 Glasses of wine per week    Comment: 4 oz nightly red wine  . Drug use: No  . Sexual activity: Not on file  Lifestyle  . Physical activity:    Days per week: Not on file    Minutes per session: Not on file  . Stress: Not on file  Relationships  . Social connections:    Talks on phone: Not on file    Gets together: Not on file    Attends religious service: Not on file    Active member of club or organization: Not on file    Attends meetings of clubs or organizations: Not on file    Relationship status: Not on file  . Intimate partner violence:    Fear of current or ex partner: Not on file    Emotionally abused: Not on file    Physically abused: Not on file    Forced sexual activity: Not on file  Other Topics Concern  . Not on file  Social History Narrative  . Not on file     Family History  Problem Relation Age of Onset  . Heart disease Mother   . Cancer Father     ROS: no fevers or chills, productive cough, hemoptysis, dysphasia, odynophagia, melena, hematochezia, dysuria, hematuria, rash, seizure activity, orthopnea, PND, pedal edema, claudication. Remaining systems are negative.  Physical Exam: Well-developed well-nourished in no acute distress.  Skin is warm and dry.  HEENT is normal.  Neck is supple.  Chest is clear to auscultation with normal expansion.  Cardiovascular exam is regular rate and rhythm.  Abdominal exam nontender or distended. No masses palpated. Extremities show no edema. neuro grossly intact  ECG- personally reviewed  A/P  1 coronary artery disease status post coronary artery bypass graft-patient continues to do well with no chest pain.  Continue medical therapy.  Continue aspirin and statin.  2 prior aortic valve replacement-continue SBE prophylaxis.  3 hyperlipidemia-continue present dose of statin.  She did not tolerate higher doses in the past.  Lipids and liver are monitored by primary care.  4 palpitations-continue beta-blocker.  [name], MD

## 2018-06-07 ENCOUNTER — Ambulatory Visit: Payer: Medicare HMO | Admitting: Cardiology

## 2018-08-21 ENCOUNTER — Telehealth: Payer: Self-pay | Admitting: Cardiology

## 2018-08-21 NOTE — Telephone Encounter (Signed)
New Message    Pt is calling about the appointment her and her husband have scheduled and they are wondering if they can take the stairs and not the elevator    Please call

## 2018-08-22 NOTE — Telephone Encounter (Signed)
Pt consents to making her appt 09/06/18 with Dr. Stanford Breed a video visit. Cell [phone]     Virtual Visit Pre-Appointment Phone Call   1. "What is the BEST phone number to call the day of the visit?" - include this in appointment notes [phone]  2. "Do you have or have access to (through a family member/friend) a smartphone with video capability that we can use for your visit?" a. If yes - list this number in appt notes as "cell" (if different from BEST phone #) and list the appointment type as a VIDEO visit in appointment notes b. If no - list the appointment type as a PHONE visit in appointment notes  3. Confirm consent - "In the setting of the current Covid19 crisis, you are scheduled for a video visit with your provider on 09/06/18.  Just as we do with many in-office visits, in order for you to participate in this visit, we must obtain consent.  If you'd like, I can send this to your mychart (if signed up) or email for you to review.  Otherwise, I can obtain your verbal consent now.  All virtual visits are billed to your insurance company just like a normal visit would be.  By agreeing to a virtual visit, we'd like you to understand that the technology does not allow for your provider to perform an examination, and thus may limit your provider's ability to fully assess your condition. If your provider identifies any concerns that need to be evaluated in person, we will make arrangements to do so.  Finally, though the technology is pretty good, we cannot assure that it will always work on either your or our end, and in the setting of a video visit, we may have to convert it to a phone-only visit.  In either situation, we cannot ensure that we have a secure connection.  Are you willing to proceed?" STAFF: Did the patient verbally acknowledge consent to telehealth visit? Document YES/NO here: YES  4. Advise patient to be prepared - "Two hours prior to your appointment, go ahead and check your  blood pressure, pulse, oxygen saturation, and your weight (if you have the equipment to check those) and write them all down. When your visit starts, your provider will ask you for this information. If you have an Apple Watch or Kardia device, please plan to have heart rate information ready on the day of your appointment. Please have a pen and paper handy nearby the day of the visit as well."  5. Give patient instructions for MyChart download to smartphone OR Doximity/[email or web address] as below if video visit (depending on what platform provider is using)  6. Inform patient they will receive a phone call 15 minutes prior to their appointment time (may be from unknown caller ID) so they should be prepared to answer    TELEPHONE CALL NOTE  Linda Parker has been deemed a candidate for a follow-up tele-health visit to limit community exposure during the Covid-19 pandemic. I spoke with the patient via phone to ensure availability of phone/video source, confirm preferred email & phone number, and discuss instructions and expectations.  I reminded Linda Parker to be prepared with any vital sign and/or heart rhythm information that could potentially be obtained via home monitoring, at the time of her visit. I reminded Linda Parker to expect a phone call prior to her visit.  Stephani Police, RN 08/22/2018 10:11 AM   INSTRUCTIONS FOR DOWNLOADING THE MYCHART APP TO SMARTPHONE  -  The patient must first make sure to have activated MyChart and know their login information - If Apple, go to [employer] and type in MyChart in the search bar and download the app. If Android, ask patient to go to [employer] and type in [place] in the search bar and download the app. The app is free but as with any other app downloads, their phone may require them to verify saved payment information or Apple/Android password.  - The patient will need to then log into the app with their MyChart username and password, and select [facility]  as their healthcare provider to link the account. When it is time for your visit, go to the MyChart app, find appointments, and click Begin Video Visit. Be sure to Select Allow for your device to access the Microphone and Camera for your visit. You will then be connected, and your provider will be with you shortly.  **If they have any issues connecting, or need assistance please contact MyChart service desk [phone])**  **If using a computer, in order to ensure the best quality for their visit they will need to use either of the following Internet Browsers: [employer], or Google Chrome**  IF USING DOXIMITY or DOXY.ME - The patient will receive a link just prior to their visit by text.     FULL LENGTH CONSENT FOR TELE-HEALTH VISIT   I hereby voluntarily request, consent and authorize [facility] and its employed or contracted physicians, physician assistants, nurse practitioners or other licensed health care professionals (the Practitioner), to provide me with telemedicine health care services (the "Services") as deemed necessary by the treating Practitioner. I acknowledge and consent to receive the Services by the Practitioner via telemedicine. I understand that the telemedicine visit will involve communicating with the Practitioner through live audiovisual communication technology and the disclosure of certain medical information by electronic transmission. I acknowledge that I have been given the opportunity to request an in-person assessment or other available alternative prior to the telemedicine visit and am voluntarily participating in the telemedicine visit.  I understand that I have the right to withhold or withdraw my consent to the use of telemedicine in the course of my care at any time, without affecting my right to future care or treatment, and that the Practitioner or I may terminate the telemedicine visit at any time. I understand that I have the right to  inspect all information obtained and/or recorded in the course of the telemedicine visit and may receive copies of available information for a reasonable fee.  I understand that some of the potential risks of receiving the Services via telemedicine include:  Marland Kitchen Delay or interruption in medical evaluation due to technological equipment failure or disruption; . Information transmitted may not be sufficient (e.g. poor resolution of images) to allow for appropriate medical decision making by the Practitioner; and/or  . In rare instances, security protocols could fail, causing a breach of personal health information.  Furthermore, I acknowledge that it is my responsibility to provide information about my medical history, conditions and care that is complete and accurate to the best of my ability. I acknowledge that Practitioner's advice, recommendations, and/or decision may be based on factors not within their control, such as incomplete or inaccurate data provided by me or distortions of diagnostic images or specimens that may result from electronic transmissions. I understand that the practice of medicine is not an exact science and that Practitioner makes no warranties or guarantees regarding treatment  outcomes. I acknowledge that I will receive a copy of this consent concurrently upon execution via email to the email address I last provided but may also request a printed copy by calling the office of [facility].    I understand that my insurance will be billed for this visit.   I have read or had this consent read to me. . I understand the contents of this consent, which adequately explains the benefits and risks of the Services being provided via telemedicine.  . I have been provided ample opportunity to ask questions regarding this consent and the Services and have had my questions answered to my satisfaction. . I give my informed consent for the services to be provided through the use of  telemedicine in my medical care  By participating in this telemedicine visit I agree to the above.

## 2018-08-22 NOTE — Telephone Encounter (Signed)
Follow up    Pts husband is calling and he is wondering about both of their appts  They dont want to take the elevator   They said they would be fine with changing the appt to a virtual or telephone visit.  But they want the nurse to call back to answer their questions    Please call Back

## 2018-09-05 ENCOUNTER — Telehealth: Payer: Self-pay | Admitting: Cardiology

## 2018-09-05 NOTE — Progress Notes (Signed)
Virtual Visit via Video Note   This visit type was conducted due to national recommendations for restrictions regarding the COVID-19 Pandemic (e.g. social distancing) in an effort to limit this patient's exposure and mitigate transmission in our community.  Due to her co-morbid illnesses, this patient is at least at moderate risk for complications without adequate follow up.  This format is felt to be most appropriate for this patient at this time.  All issues noted in this document were discussed and addressed.  A limited physical exam was performed with this format.  Please refer to the patient's chart for her consent to telehealth for [facility].   Date:  09/06/2018   ID:  Linda Parker, DOB May 12, 1935, MRN [id number]  Patient Location: Home Provider Location: Home  PCP:  Tisovec, Fransico Him, MD  Cardiologist:  [name], MD   Evaluation Performed:  Follow-Up Visit  Chief Complaint:  FU CAD and AVR  History of Present Illness:    FU AVR and CAD. Echocardiogram in May 2015 demonstrated severe aortic stenosis. She was symptomatic and referred for cardiac catheterization. This demonstrated 3 vessel CAD. She was referred for CABG and AVR. She was admitted 6/15 and underwent CABG (LIMA-LAD, SVG-D1, SVG-D2, SVG-OM1) and bioprosthetic AVR (23 mm Edwards magna-ease pericardial valve). Note preoperative carotid Dopplers May 2015 showed bilateral 1-39% stenosis. Last echocardiogram January 2017 showed normal LV function, grade 1 diastolic dysfunction, bioprosthetic aortic valve with mean gradient 9 mmHg and moderate left atrial enlargement. Since last seen, no dyspnea, CP or palpitations.   The patient does not have symptoms concerning for COVID-19 infection (fever, chills, cough, or new shortness of breath).    Past Medical History:  Diagnosis Date   Allergy    Aortic stenosis    s/p AVR w/ 23 mm Edward Magna-ease Pericardial Valve   Arthritis    hands & knee- L    Dysrhythmia     when taking antihistamines   Heart murmur    History of echocardiogram    Echo (8/15): EF 55-60%, normal wall motion, grade 1 diastolic dysfunction, AVR okay (mean 10 mm Hg), normal RV function, moderate TR   Osteoporosis    Palpitations    Unspecified hypothyroidism    Vertigo    Past Surgical History:  Procedure Laterality Date   ABDOMINAL HYSTERECTOMY     AORTIC VALVE REPLACEMENT N/A 09/24/2013   Procedure: AORTIC VALVE REPLACEMENT (AVR) using Pericardial Tissue Valve (size 26mm).;  Surgeon: Gaye Pollack, MD;  Location: [facility] OR;  Service: Open Heart Surgery;  Laterality: N/A;   APPENDECTOMY     at time of hysterectomy   bilateral knee surgery  35 yrs. ago   cartilage removed from knees   [place] GRAFT N/A 09/24/2013   Procedure: CORONARY ARTERY BYPASS GRAFTING (CABG) x4: LIMA to LAD, SVG to Diagonal 1, SVG to Diagonal 2, Svg to OM 1.;  Surgeon: Gaye Pollack, MD;  Location: [facility] OR;  Service: Open Heart Surgery;  Laterality: N/A;   FINGER SURGERY     left hand   INJECTION KNEE Left 03/04/2013   Procedure: left KNEE cortisone INJECTION;  Surgeon: Gearlean Alf, MD;  Location: [facility] ORS;  Service: Orthopedics;  Laterality: Left;   INTRAOPERATIVE TRANSESOPHAGEAL ECHOCARDIOGRAM N/A 09/24/2013   Procedure: INTRAOPERATIVE TRANSESOPHAGEAL ECHOCARDIOGRAM;  Surgeon: Gaye Pollack, MD;  Location: [facility] OR;  Service: Open Heart Surgery;  Laterality: N/A;  LEFT AND RIGHT HEART CATHETERIZATION WITH CORONARY ANGIOGRAM N/A 09/17/2013   Procedure: LEFT AND RIGHT HEART CATHETERIZATION WITH CORONARY ANGIOGRAM;  Surgeon: Blane Ohara, MD;  Location: [facility] CATH LAB;  Service: Cardiovascular;  Laterality: N/A;   MASS EXCISION Right 05/17/2018   Procedure: EXCISION CYST RIGHT RING FINGER, DEBRIDEMENT DISTAL INTERPHALANGEAL RIGHT FINGER;  Surgeon: Daryll Brod, MD;  Location: [place];  Service:  Orthopedics;  Laterality: Right;   TONSILLECTOMY     TOTAL KNEE ARTHROPLASTY Right 03/04/2013   Procedure: RIGHT TOTAL KNEE ARTHROPLASTY;  Surgeon: Gearlean Alf, MD;  Location: [facility];  Service: Orthopedics;  Laterality: Right;     Current Meds  Medication Sig   Ascorbic Acid (VITAMIN C) POWD Take by mouth. Taking 1/4 teaspoon daily   aspirin 81 MG tablet Take 81 mg by mouth daily.   cholecalciferol (VITAMIN D) 1000 UNITS tablet Take 2,000 Units by mouth daily.    CINNAMON PO Take by mouth. Takes 1/4 teaspoon daily   estradiol (ESTRACE) 0.5 MG tablet Take 0.5 mg by mouth daily before breakfast.    folic acid (FOLVITE) 161 MCG tablet Take 400 mcg by mouth daily.   Glucosamine-Chondroitin (GLUCOSAMINE CHONDR COMPLEX PO) Take 1 tablet by mouth 2 (two) times daily.   ibuprofen (ADVIL,MOTRIN) 200 MG tablet Take 200 mg by mouth every 6 (six) hours as needed for headache or mild pain.   levothyroxine (SYNTHROID, LEVOTHROID) 50 MCG tablet Take 50 mcg by mouth daily.    lovastatin (MEVACOR) 40 MG tablet Take 40 mg by mouth every other day. Per Dr. Osborne Casco    Methylsulfonylmethane (MSM PO) Take 1 tablet by mouth daily.   omega-3 acid ethyl esters (LOVAZA) 1 G capsule Take 1 g by mouth 2 (two) times daily.   traMADol (ULTRAM) 50 MG tablet Take 1 tablet (50 mg total) by mouth every 6 (six) hours as needed.   vitamin E 400 UNIT capsule Take 400 Units by mouth daily.     Allergies:   Erythromycin; Monosodium glutamate; Nitrates, organic; Sulfonamide derivatives; Ciprofloxacin; and Versed [midazolam]   Social History   Tobacco Use   Smoking status: Never Smoker   Smokeless tobacco: Never Used  Substance Use Topics   Alcohol use: Yes    Alcohol/week: 1.0 standard drinks    Types: 1 Glasses of wine per week    Comment: 4 oz nightly red wine   Drug use: No     Family Hx: The patient's family history includes Cancer in her father; Heart disease in her mother.  ROS:     Please see the history of present illness.    No fevers, chills or productive cough. All other systems reviewed and are negative.   Recent Lipid Panel Lab Results  Component Value Date/Time   CHOL 142 01/23/2014 08:22 AM   TRIG 63 01/23/2014 08:22 AM   HDL 66 01/23/2014 08:22 AM   CHOLHDL 2.2 01/23/2014 08:22 AM   LDLCALC 63 01/23/2014 08:22 AM    Wt Readings from Last 3 Encounters:  09/06/18 121 lb 3.2 oz (55 kg)  05/17/18 125 lb 3.5 oz (56.8 kg)  06/02/17 125 lb (56.7 kg)     Objective:    Vital Signs:  BP 111/80    Pulse 89    Ht 5\' 3"  (1.6 m)    Wt 121 lb 3.2 oz (55 kg)    BMI 21.47 kg/m    VITAL SIGNS:  reviewed  No acute distress Normal affect Answers questions appropriately  Remainder physical examination not performed (telehealth visit; coronavirus pandemic)  ASSESSMENT & PLAN:    1. Coronary artery disease-patient doing well with no chest pain.  Continue medical therapy with aspirin and statin. 2. Prior aortic valve replacement-patient denies any new symptoms.  Continue SBE prophylaxis. 3. Hyperlipidemia-continue statin.  Lipids and liver are monitored by primary care.  Note she did not tolerate higher doses previously. 4. Palpitations-well-controlled.  Can resume beta blocker in future if needed.  COVID-19 Education: The importance of social distancing was discussed today.  Time:   Today, I have spent 12 minutes with the patient with telehealth technology discussing the above problems.     Medication Adjustments/Labs and Tests Ordered: Current medicines are reviewed at length with the patient today.  Concerns regarding medicines are outlined above.   Tests Ordered: No orders of the defined types were placed in this encounter.   Medication Changes: No orders of the defined types were placed in this encounter.   Disposition:  Follow up in 6 month(s)  Signed, [name], MD  09/06/2018 9:22 AM    [place]

## 2018-09-05 NOTE — Telephone Encounter (Signed)
[phone]/ consent/ my chart via email/ pre reg completed

## 2018-09-06 ENCOUNTER — Telehealth (INDEPENDENT_AMBULATORY_CARE_PROVIDER_SITE_OTHER): Payer: Medicare HMO | Admitting: Cardiology

## 2018-09-06 ENCOUNTER — Encounter: Payer: Self-pay | Admitting: Cardiology

## 2018-09-06 VITALS — BP 111/80 | HR 89 | Ht 63.0 in | Wt 121.2 lb

## 2018-09-06 DIAGNOSIS — E7800 Pure hypercholesterolemia, unspecified: Secondary | ICD-10-CM

## 2018-09-06 DIAGNOSIS — I2510 Atherosclerotic heart disease of native coronary artery without angina pectoris: Secondary | ICD-10-CM | POA: Diagnosis not present

## 2018-09-06 DIAGNOSIS — Z953 Presence of xenogenic heart valve: Secondary | ICD-10-CM

## 2018-09-06 NOTE — Patient Instructions (Signed)
Medication Instructions:  NO CHANGE If you need a refill on your cardiac medications before your next appointment, please call your pharmacy.   Lab work: If you have labs (blood work) drawn today and your tests are completely normal, you will receive your results only by: . MyChart Message (if you have MyChart) OR . A paper copy in the mail If you have any lab test that is abnormal or we need to change your treatment, we will call you to review the results.  Follow-Up: Your physician wants you to follow-up in: 6 MONTHS WITH DR CRENSHAW You will receive a reminder letter in the mail two months in advance. If you don't receive a letter, please call our office to schedule the follow-up appointment.    

## 2018-10-31 ENCOUNTER — Telehealth: Payer: Self-pay | Admitting: Cardiology

## 2018-10-31 NOTE — Telephone Encounter (Signed)
Called patient, notified to resume medication. Patient verbalized understanding.

## 2018-10-31 NOTE — Telephone Encounter (Signed)
Resume metoprolol at present dose And follow HR and BP [name]

## 2018-10-31 NOTE — Telephone Encounter (Signed)
New message    Pt c/o swelling: STAT is pt has developed SOB within 24 hours  How much weight have you gained and in what time span? No  1) If swelling, where is the swelling located? Right leg and ankle,   2) Are you currently taking a fluid pill? No   3) Are you currently SOB? No   4) Do you have a log of your daily weights (if so, list)? No   5) Have you gained 3 pounds in a day or 5 pounds in a week? No   6) Have you traveled recently? Yes

## 2018-10-31 NOTE — Telephone Encounter (Signed)
Called patient, she states that she has noticed some swelling in her right ankle that has been going on for months now. No weight gain, no palpitations, no chest pain, no dizziness. She does have support stockings and when she remembers to wear them they are helpful and control the swelling. She has been eating more salt recently and with the hot weather it could be coming from that. Patient stopped taking her Metoprolol a while back, but states this morning she woke up and her HR was 97, with some SOB. She took the 0.5 tablet and the BP now was 120/71 and HR 76. She is concerned if she should use the Metoprolol as needed if the HR goes back up again. I advised patient to try to use the support stocking more, and to cut back on the salt, and elevate legs. Patient verbalized understanding, but will route to MD to advise on medication question and any other recommendations.

## 2018-11-30 DIAGNOSIS — E7800 Pure hypercholesterolemia, unspecified: Secondary | ICD-10-CM | POA: Diagnosis not present

## 2018-11-30 DIAGNOSIS — M859 Disorder of bone density and structure, unspecified: Secondary | ICD-10-CM | POA: Diagnosis not present

## 2018-11-30 DIAGNOSIS — E038 Other specified hypothyroidism: Secondary | ICD-10-CM | POA: Diagnosis not present

## 2018-12-03 DIAGNOSIS — E7800 Pure hypercholesterolemia, unspecified: Secondary | ICD-10-CM | POA: Diagnosis not present

## 2018-12-03 DIAGNOSIS — Z1331 Encounter for screening for depression: Secondary | ICD-10-CM | POA: Diagnosis not present

## 2018-12-03 DIAGNOSIS — M1990 Unspecified osteoarthritis, unspecified site: Secondary | ICD-10-CM | POA: Diagnosis not present

## 2018-12-03 DIAGNOSIS — I350 Nonrheumatic aortic (valve) stenosis: Secondary | ICD-10-CM | POA: Diagnosis not present

## 2018-12-03 DIAGNOSIS — E039 Hypothyroidism, unspecified: Secondary | ICD-10-CM | POA: Diagnosis not present

## 2018-12-03 DIAGNOSIS — Z952 Presence of prosthetic heart valve: Secondary | ICD-10-CM | POA: Diagnosis not present

## 2018-12-03 DIAGNOSIS — Z1339 Encounter for screening examination for other mental health and behavioral disorders: Secondary | ICD-10-CM | POA: Diagnosis not present

## 2018-12-03 DIAGNOSIS — I119 Hypertensive heart disease without heart failure: Secondary | ICD-10-CM | POA: Diagnosis not present

## 2018-12-03 DIAGNOSIS — M8580 Other specified disorders of bone density and structure, unspecified site: Secondary | ICD-10-CM | POA: Diagnosis not present

## 2018-12-03 DIAGNOSIS — R82998 Other abnormal findings in urine: Secondary | ICD-10-CM | POA: Diagnosis not present

## 2018-12-03 DIAGNOSIS — I25810 Atherosclerosis of coronary artery bypass graft(s) without angina pectoris: Secondary | ICD-10-CM | POA: Diagnosis not present

## 2018-12-03 DIAGNOSIS — Z0000 Encounter for general adult medical examination without abnormal findings: Secondary | ICD-10-CM | POA: Diagnosis not present

## 2019-02-06 ENCOUNTER — Ambulatory Visit: Payer: Medicare HMO | Admitting: Cardiology

## 2019-02-19 NOTE — Progress Notes (Signed)
HPI: FU AVR and CAD. Echocardiogram in May 2015 demonstrated severe aortic stenosis. She was symptomatic and referred for cardiac catheterization. This demonstrated 3 vessel CAD. She was referred for CABG and AVR. She was admitted 6/15 and underwent CABG (LIMA-LAD, SVG-D1, SVG-D2, SVG-OM1) and bioprosthetic AVR (23 mm Edwards magna-ease pericardial valve). Note preoperative carotid Dopplers May 2015 showed bilateral 1-39% stenosis. Last echocardiogram January 2017 showed normal LV function, grade 1 diastolic dysfunction, bioprosthetic aortic valve with mean gradient 9 mmHg and moderate left atrial enlargement. Since last seen, the patient denies any dyspnea on exertion, orthopnea, PND, pedal edema, palpitations, syncope or chest pain.   Current Outpatient Medications  Medication Sig Dispense Refill  . Ascorbic Acid (VITAMIN C) POWD Take by mouth. Taking 1/4 teaspoon daily    . aspirin 81 MG tablet Take 81 mg by mouth daily.    . cholecalciferol (VITAMIN D) 1000 UNITS tablet Take 2,000 Units by mouth daily.     Marland Kitchen CINNAMON PO Take by mouth. Takes 1/4 teaspoon daily    . estradiol (ESTRACE) 0.5 MG tablet Take 0.5 mg by mouth daily before breakfast.     . folic acid (FOLVITE) [id number] MCG tablet Take 400 mcg by mouth daily.    . Glucosamine-Chondroitin (GLUCOSAMINE CHONDR COMPLEX PO) Take 1 tablet by mouth 2 (two) times daily.    Marland Kitchen ibuprofen (ADVIL,MOTRIN) 200 MG tablet Take 200 mg by mouth every 6 (six) hours as needed for headache or mild pain.    Marland Kitchen levothyroxine (SYNTHROID, LEVOTHROID) 50 MCG tablet Take 50 mcg by mouth daily.     Marland Kitchen lovastatin (MEVACOR) 40 MG tablet Take 40 mg by mouth every other day. Per Dr. Osborne Casco     . Methylsulfonylmethane (MSM PO) Take 1 tablet by mouth daily.    Marland Kitchen omega-3 acid ethyl esters (LOVAZA) 1 G capsule Take 1 g by mouth 2 (two) times daily.    . traMADol (ULTRAM) 50 MG tablet Take 1 tablet (50 mg total) by mouth every 6 (six) hours as needed. 20 tablet 0  .  vitamin E 400 UNIT capsule Take 400 Units by mouth daily.    . metoprolol succinate (TOPROL-XL) 25 MG 24 hr tablet Take 0.5 tablets (12.5 mg total) by mouth daily. (Patient not taking: Reported on 09/06/2018) 90 tablet 3   No current facility-administered medications for this visit.      Past Medical History:  Diagnosis Date  . Allergy   . Aortic stenosis    s/p AVR w/ 23 mm Edward Magna-ease Pericardial Valve  . Arthritis    hands & knee- L   . Dysrhythmia    when taking antihistamines  . Heart murmur   . History of echocardiogram    Echo (8/15): EF 55-60%, normal wall motion, grade 1 diastolic dysfunction, AVR okay (mean 10 mm Hg), normal RV function, moderate TR  . Osteoporosis   . Palpitations   . Unspecified hypothyroidism   . Vertigo     Past Surgical History:  Procedure Laterality Date  . ABDOMINAL HYSTERECTOMY    . AORTIC VALVE REPLACEMENT N/A 09/24/2013   Procedure: AORTIC VALVE REPLACEMENT (AVR) using Pericardial Tissue Valve (size 63mm).;  Surgeon: Gaye Pollack, MD;  Location: [facility] OR;  Service: Open Heart Surgery;  Laterality: N/A;  . APPENDECTOMY     at time of hysterectomy  . bilateral knee surgery  35 yrs. ago   cartilage removed from knees  . CARDIAC CATHETERIZATION    . CARDIAC VALVE  REPLACEMENT    . CORONARY ARTERY BYPASS GRAFT N/A 09/24/2013   Procedure: CORONARY ARTERY BYPASS GRAFTING (CABG) x4: LIMA to LAD, SVG to Diagonal 1, SVG to Diagonal 2, Svg to OM 1.;  Surgeon: Gaye Pollack, MD;  Location: [facility] OR;  Service: Open Heart Surgery;  Laterality: N/A;  . FINGER SURGERY     left hand  . INJECTION KNEE Left 03/04/2013   Procedure: left KNEE cortisone INJECTION;  Surgeon: Gearlean Alf, MD;  Location: [facility] ORS;  Service: Orthopedics;  Laterality: Left;  . INTRAOPERATIVE TRANSESOPHAGEAL ECHOCARDIOGRAM N/A 09/24/2013   Procedure: INTRAOPERATIVE TRANSESOPHAGEAL ECHOCARDIOGRAM;  Surgeon: Gaye Pollack, MD;  Location: [facility] OR;  Service: Open Heart Surgery;   Laterality: N/A;  . LEFT AND RIGHT HEART CATHETERIZATION WITH CORONARY ANGIOGRAM N/A 09/17/2013   Procedure: LEFT AND RIGHT HEART CATHETERIZATION WITH CORONARY ANGIOGRAM;  Surgeon: Blane Ohara, MD;  Location: [facility] CATH LAB;  Service: Cardiovascular;  Laterality: N/A;  . MASS EXCISION Right 05/17/2018   Procedure: EXCISION CYST RIGHT RING FINGER, DEBRIDEMENT DISTAL INTERPHALANGEAL RIGHT FINGER;  Surgeon: Daryll Brod, MD;  Location: [place];  Service: Orthopedics;  Laterality: Right;  . TONSILLECTOMY    . TOTAL KNEE ARTHROPLASTY Right 03/04/2013   Procedure: RIGHT TOTAL KNEE ARTHROPLASTY;  Surgeon: Gearlean Alf, MD;  Location: [facility];  Service: Orthopedics;  Laterality: Right;    Social History   Socioeconomic History  . Marital status: Married    Spouse name: Not on file  . Number of children: Not on file  . Years of education: Not on file  . Highest education level: Not on file  Occupational History  . Not on file  Social Needs  . Financial resource strain: Not on file  . Food insecurity    Worry: Not on file    Inability: Not on file  . Transportation needs    Medical: Not on file    Non-medical: Not on file  Tobacco Use  . Smoking status: Never Smoker  . Smokeless tobacco: Never Used  Substance and Sexual Activity  . Alcohol use: Yes    Alcohol/week: 1.0 standard drinks    Types: 1 Glasses of wine per week    Comment: 4 oz nightly red wine  . Drug use: No  . Sexual activity: Not on file  Lifestyle  . Physical activity    Days per week: Not on file    Minutes per session: Not on file  . Stress: Not on file  Relationships  . Social Herbalist on phone: Not on file    Gets together: Not on file    Attends religious service: Not on file    Active member of club or organization: Not on file    Attends meetings of clubs or organizations: Not on file    Relationship status: Not on file  . Intimate partner violence    Fear of current or ex  partner: Not on file    Emotionally abused: Not on file    Physically abused: Not on file    Forced sexual activity: Not on file  Other Topics Concern  . Not on file  Social History Narrative  . Not on file    Family History  Problem Relation Age of Onset  . Heart disease Mother   . Cancer Father     ROS: no fevers or chills, productive cough, hemoptysis, dysphasia, odynophagia, melena, hematochezia, dysuria, hematuria, rash, seizure activity, orthopnea, PND, pedal edema,  claudication. Remaining systems are negative.  Physical Exam: Well-developed well-nourished in no acute distress.  Skin is warm and dry.  HEENT is normal.  Neck is supple.  Chest is clear to auscultation with normal expansion.  Cardiovascular exam is regular rate and rhythm.  Abdominal exam nontender or distended. No masses palpated. Extremities show no edema. neuro grossly intact  ECG-sinus rhythm with occasional PAC, left anterior fascicular block, no ST changes.  Personally reviewed  A/P  1 coronary artery disease-patient denies chest pain.  Continue medical therapy with aspirin and statin.  2 prior aortic valve replacement-continue SBE prophylaxis.  3 hyperlipidemia-continue statin.  Lipids and liver monitored by primary care.  Patient did not tolerate higher doses of statins previously.   4 palpitations-no recent symptoms.  [name], MD

## 2019-02-20 ENCOUNTER — Ambulatory Visit (INDEPENDENT_AMBULATORY_CARE_PROVIDER_SITE_OTHER): Payer: Medicare HMO | Admitting: Cardiology

## 2019-02-20 ENCOUNTER — Other Ambulatory Visit: Payer: Self-pay

## 2019-02-20 ENCOUNTER — Encounter: Payer: Self-pay | Admitting: Cardiology

## 2019-02-20 VITALS — BP 134/78 | HR 83 | Ht 63.0 in | Wt 126.4 lb

## 2019-02-20 DIAGNOSIS — E7800 Pure hypercholesterolemia, unspecified: Secondary | ICD-10-CM | POA: Diagnosis not present

## 2019-02-20 DIAGNOSIS — I2510 Atherosclerotic heart disease of native coronary artery without angina pectoris: Secondary | ICD-10-CM

## 2019-02-20 DIAGNOSIS — Z953 Presence of xenogenic heart valve: Secondary | ICD-10-CM | POA: Diagnosis not present

## 2019-02-20 NOTE — Patient Instructions (Signed)
Medication Instructions:  NO CHANGE *If you need a refill on your cardiac medications before your next appointment, please call your pharmacy*  Lab Work: If you have labs (blood work) drawn today and your tests are completely normal, you will receive your results only by: . MyChart Message (if you have MyChart) OR . A paper copy in the mail If you have any lab test that is abnormal or we need to change your treatment, we will call you to review the results.  Follow-Up: At [facility], you and your health needs are our priority.  As part of our continuing mission to provide you with exceptional heart care, we have created designated Provider Care Teams.  These Care Teams include your primary Cardiologist (physician) and Advanced Practice Providers (APPs -  Physician Assistants and Nurse Practitioners) who all work together to provide you with the care you need, when you need it.  Your next appointment:   6 month(s)  The format for your next appointment:   In Person  Provider:   Brian Crenshaw, MD   

## 2019-02-27 DIAGNOSIS — L821 Other seborrheic keratosis: Secondary | ICD-10-CM | POA: Diagnosis not present

## 2019-02-27 DIAGNOSIS — D485 Neoplasm of uncertain behavior of skin: Secondary | ICD-10-CM | POA: Diagnosis not present

## 2019-02-27 DIAGNOSIS — D225 Melanocytic nevi of trunk: Secondary | ICD-10-CM | POA: Diagnosis not present

## 2019-02-27 DIAGNOSIS — L812 Freckles: Secondary | ICD-10-CM | POA: Diagnosis not present

## 2019-02-27 DIAGNOSIS — L309 Dermatitis, unspecified: Secondary | ICD-10-CM | POA: Diagnosis not present

## 2019-02-27 DIAGNOSIS — C44519 Basal cell carcinoma of skin of other part of trunk: Secondary | ICD-10-CM | POA: Diagnosis not present

## 2019-02-27 DIAGNOSIS — L820 Inflamed seborrheic keratosis: Secondary | ICD-10-CM | POA: Diagnosis not present

## 2019-02-27 DIAGNOSIS — L570 Actinic keratosis: Secondary | ICD-10-CM | POA: Diagnosis not present

## 2019-02-27 DIAGNOSIS — C44529 Squamous cell carcinoma of skin of other part of trunk: Secondary | ICD-10-CM | POA: Diagnosis not present

## 2019-04-10 ENCOUNTER — Telehealth: Payer: Self-pay | Admitting: *Deleted

## 2019-04-10 DIAGNOSIS — E7800 Pure hypercholesterolemia, unspecified: Secondary | ICD-10-CM

## 2019-04-10 MED ORDER — LOVASTATIN 40 MG PO TABS: 40.0000 mg | ORAL_TABLET | Freq: Every day | ORAL | 3 refills | Status: DC

## 2019-04-10 NOTE — Telephone Encounter (Signed)
Patient was at office visit with her husband. She will increase lovastatin to everyday and will have labs drawn in 12 weeks.

## 2019-05-10 ENCOUNTER — Telehealth: Payer: Self-pay | Admitting: Cardiology

## 2019-05-10 NOTE — Telephone Encounter (Signed)
We are recommending the COVID-19 vaccine to all of our patients. Cardiac medications (including blood thinners) should not deter anyone from being vaccinated and there is no need to hold any of those medications prior to vaccine administration.     Currently, there is a hotline to call (active 05/03/19) to schedule vaccination appointments as no walk-ins will be accepted.   Number: [phone]    If you have further questions or concerns about the vaccine process, please visit [email or web address] or contact your primary care physician.  I have informed patient of instructions.         

## 2019-05-15 ENCOUNTER — Ambulatory Visit: Payer: Medicare HMO | Attending: Internal Medicine

## 2019-05-15 DIAGNOSIS — Z23 Encounter for immunization: Secondary | ICD-10-CM | POA: Insufficient documentation

## 2019-05-15 NOTE — Progress Notes (Signed)
   Covid-19 Vaccination Clinic  Name:  DENZEL BACON    MRN: [id number] DOB: 02-15-1936  05/15/2019  Ms. Dever was observed post Covid-19 immunization for 15 minutes without incidence. She was provided with Vaccine Information Sheet and instruction to access the V-Safe system.   Ms. Bain was instructed to call 911 with any severe reactions post vaccine: Marland Kitchen Difficulty breathing  . Swelling of your face and throat  . A fast heartbeat  . A bad rash all over your body  . Dizziness and weakness    Immunizations Administered    Name Date Dose VIS Date Route   Pfizer COVID-19 Vaccine 05/15/2019  4:40 PM 0.3 mL 04/05/2019 Intramuscular   Manufacturer: [employer], [employer]   Lot: [id number]   [place]: [id number]

## 2019-05-20 ENCOUNTER — Telehealth: Payer: Self-pay | Admitting: Cardiology

## 2019-05-20 NOTE — Telephone Encounter (Signed)
Returned call to patient-she states she has no energy and is fatigued.  Requesting B12 and iron checked.   Advised to call PCP to discuss and for lab orders if needed.  Patient verbalized understanding.

## 2019-05-20 NOTE — Telephone Encounter (Signed)
Patient calling stating she has lab work scheduled and would like   her b-12 and iron levels to be checked. Says she is very tired and would like this to be included in her lab work.

## 2019-06-02 ENCOUNTER — Ambulatory Visit: Payer: Medicare HMO | Attending: Internal Medicine

## 2019-06-02 DIAGNOSIS — Z23 Encounter for immunization: Secondary | ICD-10-CM

## 2019-06-02 NOTE — Progress Notes (Signed)
   Covid-19 Vaccination Clinic  Name:  Linda Parker    MRN: [id number] DOB: 1936/02/21  06/02/2019  [name] was observed post Covid-19 immunization for 15 minutes without incidence. She was provided with Vaccine Information Sheet and instruction to access the V-Safe system.   [name] was instructed to call 911 with any severe reactions post vaccine: Marland Kitchen Difficulty breathing  . Swelling of your face and throat  . A fast heartbeat  . A bad rash all over your body  . Dizziness and weakness    Immunizations Administered    Name Date Dose VIS Date Route   Pfizer COVID-19 Vaccine 06/02/2019  1:29 PM 0.3 mL 04/05/2019 Intramuscular   Manufacturer: [place]   Lot: [id number]   [place]: [id number]

## 2019-06-27 DIAGNOSIS — D2239 Melanocytic nevi of other parts of face: Secondary | ICD-10-CM | POA: Diagnosis not present

## 2019-06-27 DIAGNOSIS — Z85828 Personal history of other malignant neoplasm of skin: Secondary | ICD-10-CM | POA: Diagnosis not present

## 2019-06-27 DIAGNOSIS — L820 Inflamed seborrheic keratosis: Secondary | ICD-10-CM | POA: Diagnosis not present

## 2019-07-08 ENCOUNTER — Other Ambulatory Visit: Payer: Self-pay | Admitting: Cardiology

## 2019-07-08 DIAGNOSIS — E7800 Pure hypercholesterolemia, unspecified: Secondary | ICD-10-CM | POA: Diagnosis not present

## 2019-07-09 LAB — LIPID PANEL W/O CHOL/HDL RATIO
Cholesterol, Total: 142 mg/dL (ref 100–199)
HDL: 74 mg/dL (ref >39)
LDL Chol Calc (NIH): 54 mg/dL (ref 0–99)
Triglycerides: 70 mg/dL (ref 0–149)
VLDL Cholesterol Cal: 14 mg/dL (ref 5–40)

## 2019-07-09 LAB — HEPATIC FUNCTION PANEL
ALT: 11 IU/L (ref 0–32)
AST: 19 IU/L (ref 0–40)
Albumin: 4.5 g/dL (ref 3.6–4.6)
Alkaline Phosphatase: 79 IU/L (ref 39–117)
Bilirubin Total: 0.4 mg/dL (ref 0.0–1.2)
Bilirubin, Direct: 0.14 mg/dL (ref 0.00–0.40)
Total Protein: 6.8 g/dL (ref 6.0–8.5)

## 2019-07-24 DIAGNOSIS — Z96651 Presence of right artificial knee joint: Secondary | ICD-10-CM | POA: Diagnosis not present

## 2019-07-24 DIAGNOSIS — M25561 Pain in right knee: Secondary | ICD-10-CM | POA: Diagnosis not present

## 2019-08-06 DIAGNOSIS — M25561 Pain in right knee: Secondary | ICD-10-CM | POA: Diagnosis not present

## 2019-08-12 DIAGNOSIS — M25561 Pain in right knee: Secondary | ICD-10-CM | POA: Diagnosis not present

## 2019-08-16 DIAGNOSIS — M25561 Pain in right knee: Secondary | ICD-10-CM | POA: Diagnosis not present

## 2019-08-16 NOTE — Progress Notes (Signed)
HPI: FU AVR and CAD. Echocardiogram in May 2015 demonstrated severe aortic stenosis. She was symptomatic and referred for cardiac catheterization. This demonstrated 3 vessel CAD. She was referred for CABG and AVR. She was admitted 6/15 and underwent CABG (LIMA-LAD, SVG-D1, SVG-D2, SVG-OM1) and bioprosthetic AVR (23 mm Edwards magna-ease pericardial valve). Note preoperative carotid Dopplers May 2015 showed bilateral 1-39% stenosis. Last echocardiogram January 2017 showed normal LV function, grade 1 diastolic dysfunction, bioprosthetic aortic valve with mean gradient 9 mmHg and moderate left atrial enlargement. Since last seen,she denies dyspnea, chest pain, palpitations or syncope.  Current Outpatient Medications  Medication Sig Dispense Refill  . Ascorbic Acid (VITAMIN C) POWD Take by mouth. Taking 1/4 teaspoon daily    . aspirin 81 MG tablet Take 81 mg by mouth daily.    . cholecalciferol (VITAMIN D) 1000 UNITS tablet Take 2,000 Units by mouth daily.     Marland Kitchen CINNAMON PO Take by mouth. Takes 1/4 teaspoon daily    . Cyanocobalamin (VITAMIN B-12 PO) Take by mouth as directed.    Marland Kitchen estradiol (ESTRACE) 0.5 MG tablet Take 0.5 mg by mouth daily before breakfast.     . folic acid (FOLVITE) [id number] MCG tablet Take 400 mcg by mouth daily.    . Glucosamine-Chondroitin (GLUCOSAMINE CHONDR COMPLEX PO) Take 1 tablet by mouth 2 (two) times daily.    Marland Kitchen ibuprofen (ADVIL,MOTRIN) 200 MG tablet Take 200 mg by mouth every 6 (six) hours as needed for headache or mild pain.    Marland Kitchen levothyroxine (SYNTHROID, LEVOTHROID) 50 MCG tablet Take 50 mcg by mouth daily.     Marland Kitchen lovastatin (MEVACOR) 40 MG tablet Take 1 tablet (40 mg total) by mouth at bedtime. Per Dr. Osborne Casco 90 tablet 3  . Methylsulfonylmethane (MSM PO) Take 1 tablet by mouth daily.    Marland Kitchen omega-3 acid ethyl esters (LOVAZA) 1 G capsule Take 1 g by mouth 2 (two) times daily.    . traMADol (ULTRAM) 50 MG tablet Take 1 tablet (50 mg total) by mouth every 6 (six) hours  as needed. 20 tablet 0  . vitamin E 400 UNIT capsule Take 400 Units by mouth daily.     No current facility-administered medications for this visit.     Past Medical History:  Diagnosis Date  . Allergy   . Aortic stenosis    s/p AVR w/ 23 mm Edward Magna-ease Pericardial Valve  . Arthritis    hands & knee- L   . Dysrhythmia    when taking antihistamines  . Heart murmur   . History of echocardiogram    Echo (8/15): EF 55-60%, normal wall motion, grade 1 diastolic dysfunction, AVR okay (mean 10 mm Hg), normal RV function, moderate TR  . Osteoporosis   . Palpitations   . Unspecified hypothyroidism   . Vertigo     Past Surgical History:  Procedure Laterality Date  . ABDOMINAL HYSTERECTOMY    . AORTIC VALVE REPLACEMENT N/A 09/24/2013   Procedure: AORTIC VALVE REPLACEMENT (AVR) using Pericardial Tissue Valve (size 30mm).;  Surgeon: Gaye Pollack, MD;  Location: [facility];  Service: Open Heart Surgery;  Laterality: N/A;  . APPENDECTOMY     at time of hysterectomy  . bilateral knee surgery  35 yrs. ago   cartilage removed from knees  . CARDIAC CATHETERIZATION    . CARDIAC VALVE REPLACEMENT    . CORONARY ARTERY BYPASS GRAFT N/A 09/24/2013   Procedure: CORONARY ARTERY BYPASS GRAFTING (CABG) x4: LIMA to LAD, SVG  to Diagonal 1, SVG to Diagonal 2, Svg to OM 1.;  Surgeon: Gaye Pollack, MD;  Location: [facility];  Service: Open Heart Surgery;  Laterality: N/A;  . FINGER SURGERY     left hand  . INJECTION KNEE Left 03/04/2013   Procedure: left KNEE cortisone INJECTION;  Surgeon: Gearlean Alf, MD;  Location: [facility];  Service: Orthopedics;  Laterality: Left;  . INTRAOPERATIVE TRANSESOPHAGEAL ECHOCARDIOGRAM N/A 09/24/2013   Procedure: INTRAOPERATIVE TRANSESOPHAGEAL ECHOCARDIOGRAM;  Surgeon: Gaye Pollack, MD;  Location: [facility] OR;  Service: Open Heart Surgery;  Laterality: N/A;  . LEFT AND RIGHT HEART CATHETERIZATION WITH CORONARY ANGIOGRAM N/A 09/17/2013   Procedure: LEFT AND RIGHT HEART  CATHETERIZATION WITH CORONARY ANGIOGRAM;  Surgeon: Blane Ohara, MD;  Location: [facility] CATH LAB;  Service: Cardiovascular;  Laterality: N/A;  . MASS EXCISION Right 05/17/2018   Procedure: EXCISION CYST RIGHT RING FINGER, DEBRIDEMENT DISTAL INTERPHALANGEAL RIGHT FINGER;  Surgeon: Daryll Brod, MD;  Location: [facility];  Service: Orthopedics;  Laterality: Right;  . TONSILLECTOMY    . TOTAL KNEE ARTHROPLASTY Right 03/04/2013   Procedure: RIGHT TOTAL KNEE ARTHROPLASTY;  Surgeon: Gearlean Alf, MD;  Location: [facility];  Service: Orthopedics;  Laterality: Right;    Social History   Socioeconomic History  . Marital status: Married    Spouse name: Not on file  . Number of children: Not on file  . Years of education: Not on file  . Highest education level: Not on file  Occupational History  . Not on file  Tobacco Use  . Smoking status: Never Smoker  . Smokeless tobacco: Never Used  Substance and Sexual Activity  . Alcohol use: Yes    Alcohol/week: 1.0 standard drinks    Types: 1 Glasses of wine per week    Comment: 4 oz nightly red wine  . Drug use: No  . Sexual activity: Not on file  Other Topics Concern  . Not on file  Social History Narrative  . Not on file   Social Determinants of Health   Financial Resource Strain:   . Difficulty of Paying Living Expenses:   Food Insecurity:   . Worried About Charity fundraiser in the Last Year:   . Arboriculturist in the Last Year:   Transportation Needs:   . Film/video editor (Medical):   Marland Kitchen Lack of Transportation (Non-Medical):   Physical Activity:   . Days of Exercise per Week:   . Minutes of Exercise per Session:   Stress:   . Feeling of Stress :   Social Connections:   . Frequency of Communication with Friends and Family:   . Frequency of Social Gatherings with Friends and Family:   . Attends Religious Services:   . Active Member of Clubs or Organizations:   . Attends Archivist Meetings:   Marland Kitchen  Marital Status:   Intimate Partner Violence:   . Fear of Current or Ex-Partner:   . Emotionally Abused:   Marland Kitchen Physically Abused:   . Sexually Abused:     Family History  Problem Relation Age of Onset  . Heart disease Mother   . Cancer Father     ROS: Bilateral lower extremity pain from the knee down at night but no fevers or chills, productive cough, hemoptysis, dysphasia, odynophagia, melena, hematochezia, dysuria, hematuria, rash, seizure activity, orthopnea, PND, pedal edema, claudication. Remaining systems are negative.  Physical Exam: Well-developed well-nourished in no acute distress.  Skin is warm and  dry.  HEENT is normal.  Neck is supple.  Chest is clear to auscultation with normal expansion.  Cardiovascular exam is regular rate and rhythm.  Abdominal exam nontender or distended. No masses palpated. Extremities show no edema. neuro grossly intact  ECG-sinus rhythm with occasional PAC, cannot rule out septal infarct, left axis deviation.  Personally reviewed  A/P  1 coronary artery disease-patient has not had chest pain.  Continue aspirin and statin.  2 prior aortic valve replacement-continue SBE prophylaxis.  3 hyperlipidemia-continue statin.  Patient has not tolerated high doses in the past.  She also now complains of bilateral lower extremity pain.  She would like to decrease lovastatin to 20 mg daily to see if this improves her symptoms.  We will therefore decrease to that dose.  4 history of palpitations-no recent symptoms.  [name], MD

## 2019-08-20 DIAGNOSIS — H6123 Impacted cerumen, bilateral: Secondary | ICD-10-CM | POA: Diagnosis not present

## 2019-08-20 DIAGNOSIS — H6093 Unspecified otitis externa, bilateral: Secondary | ICD-10-CM | POA: Diagnosis not present

## 2019-08-21 ENCOUNTER — Ambulatory Visit: Payer: Medicare HMO | Admitting: Cardiology

## 2019-08-21 ENCOUNTER — Other Ambulatory Visit: Payer: Self-pay

## 2019-08-21 ENCOUNTER — Encounter: Payer: Self-pay | Admitting: Cardiology

## 2019-08-21 VITALS — BP 113/71 | HR 70 | Ht 63.0 in | Wt 124.1 lb

## 2019-08-21 DIAGNOSIS — Z953 Presence of xenogenic heart valve: Secondary | ICD-10-CM

## 2019-08-21 DIAGNOSIS — I2510 Atherosclerotic heart disease of native coronary artery without angina pectoris: Secondary | ICD-10-CM

## 2019-08-21 DIAGNOSIS — M25561 Pain in right knee: Secondary | ICD-10-CM | POA: Diagnosis not present

## 2019-08-21 DIAGNOSIS — E7800 Pure hypercholesterolemia, unspecified: Secondary | ICD-10-CM

## 2019-08-21 MED ORDER — LOVASTATIN 20 MG PO TABS: 20.0000 mg | ORAL_TABLET | Freq: Every day | ORAL | 3 refills | Status: DC

## 2019-08-21 NOTE — Patient Instructions (Signed)
Medication Instructions:  DECREASE LOVASTATIN TO 20 MG ONCE DAILY= 1/2 OF THE 40 MG TABLET ONCE DAILY.  *If you need a refill on your cardiac medications before your next appointment, please call your pharmacy*   Lab Work: If you have labs (blood work) drawn today and your tests are completely normal, you will receive your results only by: Marland Kitchen MyChart Message (if you have MyChart) OR . A paper copy in the mail If you have any lab test that is abnormal or we need to change your treatment, we will call you to review the results.  Follow-Up: At [facility], you and your health needs are our priority.  As part of our continuing mission to provide you with exceptional heart care, we have created designated Provider Care Teams.  These Care Teams include your primary Cardiologist (physician) and Advanced Practice Providers (APPs -  Physician Assistants and Nurse Practitioners) who all work together to provide you with the care you need, when you need it.  We recommend signing up for the patient portal called "MyChart".  Sign up information is provided on this After Visit Summary.  MyChart is used to connect with patients for Virtual Visits (Telemedicine).  Patients are able to view lab/test results, encounter notes, upcoming appointments, etc.  Non-urgent messages can be sent to your provider as well.   To learn more about what you can do with MyChart, go to [email or web address].    Your next appointment:   12 month(s)  The format for your next appointment:   Either In Person or Virtual  Provider:   [name], MD

## 2019-08-28 DIAGNOSIS — M25561 Pain in right knee: Secondary | ICD-10-CM | POA: Diagnosis not present

## 2019-09-02 DIAGNOSIS — H6093 Unspecified otitis externa, bilateral: Secondary | ICD-10-CM | POA: Diagnosis not present

## 2019-09-02 DIAGNOSIS — H6122 Impacted cerumen, left ear: Secondary | ICD-10-CM | POA: Diagnosis not present

## 2019-09-02 DIAGNOSIS — H6983 Other specified disorders of Eustachian tube, bilateral: Secondary | ICD-10-CM | POA: Diagnosis not present

## 2019-09-18 DIAGNOSIS — M25561 Pain in right knee: Secondary | ICD-10-CM | POA: Diagnosis not present

## 2019-09-24 DIAGNOSIS — R448 Other symptoms and signs involving general sensations and perceptions: Secondary | ICD-10-CM | POA: Diagnosis not present

## 2019-09-24 DIAGNOSIS — H6122 Impacted cerumen, left ear: Secondary | ICD-10-CM | POA: Diagnosis not present

## 2019-12-02 DIAGNOSIS — M859 Disorder of bone density and structure, unspecified: Secondary | ICD-10-CM | POA: Diagnosis not present

## 2019-12-02 DIAGNOSIS — Z0000 Encounter for general adult medical examination without abnormal findings: Secondary | ICD-10-CM | POA: Diagnosis not present

## 2019-12-02 DIAGNOSIS — E7800 Pure hypercholesterolemia, unspecified: Secondary | ICD-10-CM | POA: Diagnosis not present

## 2019-12-02 DIAGNOSIS — E038 Other specified hypothyroidism: Secondary | ICD-10-CM | POA: Diagnosis not present

## 2019-12-09 DIAGNOSIS — M19041 Primary osteoarthritis, right hand: Secondary | ICD-10-CM | POA: Diagnosis not present

## 2019-12-09 DIAGNOSIS — M1811 Unilateral primary osteoarthritis of first carpometacarpal joint, right hand: Secondary | ICD-10-CM | POA: Diagnosis not present

## 2019-12-09 DIAGNOSIS — I119 Hypertensive heart disease without heart failure: Secondary | ICD-10-CM | POA: Diagnosis not present

## 2019-12-09 DIAGNOSIS — M7989 Other specified soft tissue disorders: Secondary | ICD-10-CM | POA: Diagnosis not present

## 2019-12-09 DIAGNOSIS — I350 Nonrheumatic aortic (valve) stenosis: Secondary | ICD-10-CM | POA: Diagnosis not present

## 2019-12-09 DIAGNOSIS — E7800 Pure hypercholesterolemia, unspecified: Secondary | ICD-10-CM | POA: Diagnosis not present

## 2019-12-09 DIAGNOSIS — M71341 Other bursal cyst, right hand: Secondary | ICD-10-CM | POA: Diagnosis not present

## 2019-12-09 DIAGNOSIS — Z0000 Encounter for general adult medical examination without abnormal findings: Secondary | ICD-10-CM | POA: Diagnosis not present

## 2019-12-09 DIAGNOSIS — M1990 Unspecified osteoarthritis, unspecified site: Secondary | ICD-10-CM | POA: Diagnosis not present

## 2019-12-09 DIAGNOSIS — I25810 Atherosclerosis of coronary artery bypass graft(s) without angina pectoris: Secondary | ICD-10-CM | POA: Diagnosis not present

## 2019-12-09 DIAGNOSIS — E039 Hypothyroidism, unspecified: Secondary | ICD-10-CM | POA: Diagnosis not present

## 2019-12-09 DIAGNOSIS — R82998 Other abnormal findings in urine: Secondary | ICD-10-CM | POA: Diagnosis not present

## 2019-12-09 DIAGNOSIS — J309 Allergic rhinitis, unspecified: Secondary | ICD-10-CM | POA: Diagnosis not present

## 2019-12-09 DIAGNOSIS — Z952 Presence of prosthetic heart valve: Secondary | ICD-10-CM | POA: Diagnosis not present

## 2020-02-22 DIAGNOSIS — Z23 Encounter for immunization: Secondary | ICD-10-CM | POA: Diagnosis not present

## 2020-02-27 DIAGNOSIS — Z85828 Personal history of other malignant neoplasm of skin: Secondary | ICD-10-CM | POA: Diagnosis not present

## 2020-02-27 DIAGNOSIS — L812 Freckles: Secondary | ICD-10-CM | POA: Diagnosis not present

## 2020-02-27 DIAGNOSIS — D1801 Hemangioma of skin and subcutaneous tissue: Secondary | ICD-10-CM | POA: Diagnosis not present

## 2020-02-27 DIAGNOSIS — H61002 Unspecified perichondritis of left external ear: Secondary | ICD-10-CM | POA: Diagnosis not present

## 2020-02-27 DIAGNOSIS — L821 Other seborrheic keratosis: Secondary | ICD-10-CM | POA: Diagnosis not present

## 2020-02-27 DIAGNOSIS — L820 Inflamed seborrheic keratosis: Secondary | ICD-10-CM | POA: Diagnosis not present

## 2020-03-06 DIAGNOSIS — Z1231 Encounter for screening mammogram for malignant neoplasm of breast: Secondary | ICD-10-CM | POA: Diagnosis not present

## 2020-03-25 DIAGNOSIS — R922 Inconclusive mammogram: Secondary | ICD-10-CM | POA: Diagnosis not present

## 2020-06-24 ENCOUNTER — Other Ambulatory Visit: Payer: Self-pay | Admitting: Orthopedic Surgery

## 2020-06-24 DIAGNOSIS — M6740 Ganglion, unspecified site: Secondary | ICD-10-CM | POA: Diagnosis not present

## 2020-06-24 DIAGNOSIS — M71341 Other bursal cyst, right hand: Secondary | ICD-10-CM | POA: Diagnosis not present

## 2020-06-24 DIAGNOSIS — M19041 Primary osteoarthritis, right hand: Secondary | ICD-10-CM | POA: Diagnosis not present

## 2020-06-25 ENCOUNTER — Telehealth: Payer: Self-pay | Admitting: *Deleted

## 2020-06-25 NOTE — Telephone Encounter (Signed)
I have tried to reach the pt again, though no success. I saw DPR ok to s/w pt's daughter Danford Bad. I have left a message for Adina that we are trying to reach the pt to inform of needing a pre op appt and that she has been scheduled to see Coletta Memos, FNP 07/10/20 @ 9:15. Did leave message to please call the office and confirm appt ok or if they need to reschedule, though surgery is 07/14/20.

## 2020-06-25 NOTE — Telephone Encounter (Signed)
   [facility] Pre-operative Risk Assessment    HEARTCARE STAFF: - Please ensure there is not already an duplicate clearance open for this procedure. - Under Visit Info/Reason for Call, type in Other and utilize the format Clearance MM/DD/YY or Clearance TBD. Do not use dashes or single digits. - If request is for dental extraction, please clarify the # of teeth to be extracted.  Request for surgical clearance:  1. What type of surgery is being performed? Excision cyst, debridement D/P right index finger  2. When is this surgery scheduled? 07/14/20   3. What type of clearance is required (medical clearance vs. Pharmacy clearance to hold med vs. Both)? medical  4. Are there any medications that need to be held prior to surgery and how long?aspirin   5. Practice name and name of physician performing surgery? [facility]   6. What is the office phone number? [phone]   7.   What is the office fax number? [phone]  8.   Anesthesia type (None, local, MAC, general) ? IV regional forearm block   Fredia Beets 06/25/2020, 7:43 AM  _________________________________________________________________   (provider comments below)

## 2020-06-25 NOTE — Telephone Encounter (Signed)
I s/w Coletta Memos, FNP ok to use time slot 07/10/20 @ 9:15 for pre op for the pt. I have tried to reach the pt to let her know we need pre op appt and that I have I scheduled her for 07/10/20 @ 9:15 with Denyse Amass, though still no answer or vm came on. I tried x 2. Please if the pt calls back please advise of her appt 07/10/20.

## 2020-06-25 NOTE — Telephone Encounter (Signed)
I tried to reach pt sot that we may schedule her for a pre op appt.

## 2020-06-25 NOTE — Telephone Encounter (Signed)
   Primary Cardiologist: [name], MD  Chart reviewed as part of pre-operative protocol coverage. Because of Linda Parker's past medical history and time since last visit, she will require a follow-up visit in order to better assess preoperative cardiovascular risk.  Pre-op covering staff: - Please schedule appointment and call patient to inform them. If patient already had an upcoming appointment within acceptable timeframe, please add "pre-op clearance" to the appointment notes so provider is aware. - Please contact requesting surgeon's office via preferred method (i.e, phone, fax) to inform them of need for appointment prior to surgery.  If applicable, this message will also be routed to pharmacy pool and/or primary cardiologist for input on holding anticoagulant/antiplatelet agent as requested below so that this information is available to the clearing provider at time of patient's appointment.   [place], [place]  06/25/2020, 9:12 AM

## 2020-06-25 NOTE — Telephone Encounter (Signed)
Spoke to pt, she stated as far as she knows this appointment should work fine. She is going to call [facility] and see when they are going to set up her covid test to make sure it does not interfere with this appt.

## 2020-07-08 ENCOUNTER — Encounter (HOSPITAL_BASED_OUTPATIENT_CLINIC_OR_DEPARTMENT_OTHER): Payer: Self-pay | Admitting: Orthopedic Surgery

## 2020-07-08 ENCOUNTER — Other Ambulatory Visit: Payer: Self-pay

## 2020-07-08 NOTE — Progress Notes (Signed)
Patient's chart, PMH reviewed with Dr Sabra Heck, patient has appt to see cardiologist Dr Stanford Breed on 07-10-20. Denies and chest pain or SOB. OK to do at [facility] and will await notes from cards appointment.

## 2020-07-09 NOTE — Progress Notes (Signed)
Cardiology Clinic Note   Patient Name: Linda Parker Date of Encounter: 07/10/2020  Primary Care Provider:  Haywood Pao, MD Primary Cardiologist:  [name], MD  Patient Profile    Linda Parker 85 year old female presents the clinic today for follow-up evaluation of coronary artery disease, palpitations, and preoperative cardiac evaluation.  Past Medical History    Past Medical History:  Diagnosis Date  . Allergy   . Aortic stenosis    s/p AVR w/ 23 mm Edward Magna-ease Pericardial Valve  . Arthritis    hands & knee- L   . Coronary artery disease   . Dysrhythmia    when taking antihistamines  . Heart murmur   . History of echocardiogram    Echo (8/15): EF 55-60%, normal wall motion, grade 1 diastolic dysfunction, AVR okay (mean 10 mm Hg), normal RV function, moderate TR  . Osteoporosis   . Palpitations   . Unspecified hypothyroidism   . Vertigo    Past Surgical History:  Procedure Laterality Date  . ABDOMINAL HYSTERECTOMY    . AORTIC VALVE REPLACEMENT N/A 09/24/2013   Procedure: AORTIC VALVE REPLACEMENT (AVR) using Pericardial Tissue Valve (size 26mm).;  Surgeon: Gaye Pollack, MD;  Location: [facility];  Service: Open Heart Surgery;  Laterality: N/A;  . APPENDECTOMY     at time of hysterectomy  . bilateral knee surgery  35 yrs. ago   cartilage removed from knees  . CARDIAC CATHETERIZATION    . CARDIAC VALVE REPLACEMENT    . CORONARY ARTERY BYPASS GRAFT N/A 09/24/2013   Procedure: CORONARY ARTERY BYPASS GRAFTING (CABG) x4: LIMA to LAD, SVG to Diagonal 1, SVG to Diagonal 2, Svg to OM 1.;  Surgeon: Gaye Pollack, MD;  Location: [facility];  Service: Open Heart Surgery;  Laterality: N/A;  . FINGER SURGERY     left hand  . INJECTION KNEE Left 03/04/2013   Procedure: left KNEE cortisone INJECTION;  Surgeon: Gearlean Alf, MD;  Location: [facility];  Service: Orthopedics;  Laterality: Left;  . INTRAOPERATIVE TRANSESOPHAGEAL ECHOCARDIOGRAM N/A 09/24/2013   Procedure:  INTRAOPERATIVE TRANSESOPHAGEAL ECHOCARDIOGRAM;  Surgeon: Gaye Pollack, MD;  Location: [facility] OR;  Service: Open Heart Surgery;  Laterality: N/A;  . LEFT AND RIGHT HEART CATHETERIZATION WITH CORONARY ANGIOGRAM N/A 09/17/2013   Procedure: LEFT AND RIGHT HEART CATHETERIZATION WITH CORONARY ANGIOGRAM;  Surgeon: Blane Ohara, MD;  Location: [facility] C[facility] LAB;  Service: Cardiovascular;  Laterality: N/A;  . MASS EXCISION Right 05/17/2018   Procedure: EXCISION CYST RIGHT RING FINGER, DEBRIDEMENT DISTAL INTERPHALANGEAL RIGHT FINGER;  Surgeon: Daryll Brod, MD;  Location: [facility];  Service: Orthopedics;  Laterality: Right;  . TONSILLECTOMY    . TOTAL KNEE ARTHROPLASTY Right 03/04/2013   Procedure: RIGHT TOTAL KNEE ARTHROPLASTY;  Surgeon: Gearlean Alf, MD;  Location: [facility];  Service: Orthopedics;  Laterality: Right;    Allergies  Allergies  Allergen Reactions  . Erythromycin Nausea Only    Feels like she is dying/out of her body  . Erythromycin Base Other (See Comments)    Has out of body feeling  . Monosodium Glutamate Other (See Comments)    Migraine headache   . Nitrates, Organic     headache  . Sulfamethoxazole Other (See Comments)    Makes her feel strange  . Sulfonamide Derivatives Nausea Only    Feels like she is dying/out of her body  . Ciprofloxacin Palpitations    Feels out of her body experience  . Versed [  Midazolam] Other (See Comments)    Pt experienced forgetfulness for days after and requests we do not use.    History of Present Illness    Linda Parker has a PMH of aortic valve disorder status post aortic valve replacement with bioprosthetic valve, coronary artery disease, palpitations, OA, dyspnea, chest pain, and postoperative anemia due to acute blood loss.  Her echocardiogram 5/15 showed severe aortic stenosis.  She was symptomatic and was referred for cardiac catheterization.  Her LHC showed three-vessel coronary artery disease.  She underwent CABG and AVR.  She  was admitted 6/15 and underwent CABG x4 with bioprosthetic AVR.  Her carotid Dopplers 5/15 showed bilateral 1-39% stenosis.  Echocardiogram 1/17 showed normal LV function, G1 DD, bioprosthetic aortic valve with mean gradient 9 mmHg and moderate left atrial enlargement.  She was last seen by Dr. Stanford Breed on 08/21/2019.  She was doing well at that time.  She denied dyspnea, chest pain, palpitations, and syncope.  She presents the clinic today for follow-up evaluation and preoperative cardiac evaluation.  She states she feels well.  She continues to be very physically active in and around her house.  She reports that she only sits for meals.  She is enjoying the spring flowers and tree blossoms.  She denies issues with her medications and reports compliance.  She is following a very strict diet and only eats fish and chicken.  She reports that she has around 3 vegetables with her evening meals every night.  She reports that her husband is also doing well.  I will have her continue her current diet, maintain her physical activity, continue her current medication regimen and follow-up in 12 months.  Today she denies chest pain, shortness of breath, lower extremity edema, fatigue, palpitations, melena, hematuria, hemoptysis, diaphoresis, weakness, presyncope, syncope, orthopnea, and PND.   Home Medications    Prior to Admission medications   Medication Sig Start Date End Date Taking? Authorizing Provider  acetaminophen (TYLENOL) 325 MG tablet Take 650 mg by mouth every 6 (six) hours as needed.    [provider]  Ascorbic Acid (VITAMIN C) POWD Take by mouth. Taking 1/4 teaspoon daily    [provider]  aspirin 81 MG tablet Take 81 mg by mouth daily.    [provider]  cholecalciferol (VITAMIN D) 1000 UNITS tablet Take 2,000 Units by mouth daily.     [provider]  CINNAMON PO Take by mouth. Takes 1/4 teaspoon daily    [provider]  Cyanocobalamin (VITAMIN  B-12 PO) Take by mouth as directed.    [provider]  estradiol (ESTRACE) 0.5 MG tablet Take 0.5 mg by mouth daily before breakfast.     [provider]  folic acid (FOLVITE) 412 MCG tablet Take 400 mcg by mouth daily.    [provider]  Glucosamine-Chondroitin (GLUCOSAMINE CHONDR COMPLEX PO) Take 1 tablet by mouth 2 (two) times daily.    [provider]  levothyroxine (SYNTHROID, LEVOTHROID) 50 MCG tablet Take 50 mcg by mouth daily.     [provider]  lovastatin (MEVACOR) 20 MG tablet Take 1 tablet (20 mg total) by mouth at bedtime. Patient taking differently: Take 20 mg by mouth at bedtime. TAKES EVERY OTHER DAY 08/21/19   Lelon Perla, MD  omega-3 acid ethyl esters (LOVAZA) 1 G capsule Take 1 g by mouth 2 (two) times daily.    [provider]  traMADol (ULTRAM) 50 MG tablet Take 1 tablet (50 mg total)  by mouth every 6 (six) hours as needed. 05/17/18   Daryll Brod, MD  vitamin E 400 UNIT capsule Take 400 Units by mouth daily.    [provider]    Family History    Family History  Problem Relation Age of Onset  . Heart disease Mother   . Cancer Father    She indicated that her mother is deceased. She indicated that her father is deceased. She indicated that her brother is alive.  Social History    Social History   Socioeconomic History  . Marital status: Married    Spouse name: Not on file  . Number of children: Not on file  . Years of education: Not on file  . Highest education level: Not on file  Occupational History  . Not on file  Tobacco Use  . Smoking status: Never Smoker  . Smokeless tobacco: Never Used  Vaping Use  . Vaping Use: Never used  Substance and Sexual Activity  . Alcohol use: Yes    Alcohol/week: 1.0 standard drink    Types: 1 Glasses of wine per week    Comment: 4 oz nightly red wine  . Drug use: No  . Sexual activity: Not Currently    Birth control/protection: Post-menopausal,  Surgical  Other Topics Concern  . Not on file  Social History Narrative  . Not on file   Social Determinants of Health   Financial Resource Strain: Not on file  Food Insecurity: Not on file  Transportation Needs: Not on file  Physical Activity: Not on file  Stress: Not on file  Social Connections: Not on file  Intimate Partner Violence: Not on file     Review of Systems    General:  No chills, fever, night sweats or weight changes.  Cardiovascular:  No chest pain, dyspnea on exertion, edema, orthopnea, palpitations, paroxysmal nocturnal dyspnea. Dermatological: No rash, lesions/masses Respiratory: No cough, dyspnea Urologic: No hematuria, dysuria Abdominal:   No nausea, vomiting, diarrhea, bright red blood per rectum, melena, or hematemesis Neurologic:  No visual changes, wkns, changes in mental status. All other systems reviewed and are otherwise negative except as noted above.  Physical Exam    VS:  BP 122/62   Pulse 75   Ht 5\' 3"  (1.6 m)   Wt 122 lb 3.2 oz (55.4 kg)   BMI 21.65 kg/m  , BMI Body mass index is 21.65 kg/m. GEN: Well nourished, well developed, in no acute distress. HEENT: normal. Neck: Supple, no JVD, carotid bruits, or masses. Cardiac: RRR, no murmurs, rubs, or gallops. No clubbing, cyanosis, edema.  Radials/DP/PT 2+ and equal bilaterally.  Respiratory:  Respirations regular and unlabored, clear to auscultation bilaterally. GI: Soft, nontender, nondistended, BS + x 4. MS: no deformity or atrophy. Skin: warm and dry, no rash. Neuro:  Strength and sensation are intact. Psych: Normal affect.  Accessory Clinical Findings    Recent Labs: No results found for requested labs within last 8760 hours.   Recent Lipid Panel    Component Value Date/Time   CHOL 142 07/08/2019 1033   TRIG 70 07/08/2019 1033   HDL 74 07/08/2019 1033   CHOLHDL 2.2 01/23/2014 0822   VLDL 13 01/23/2014 0822   LDLCALC 54 07/08/2019 1033    ECG personally reviewed by me  today-normal sinus rhythm incomplete right bundle branch block left anterior fascicular block septal infarct undetermined age 44 bpm  Echocardiogram 05/01/2015  Study Conclusions   - Left ventricle: The cavity size was normal. Wall  thickness was  normal. Systolic function was normal. The estimated ejection  fraction was in the range of 60% to 65%. Wall motion was normal;  there were no regional wall motion abnormalities. Doppler  parameters are consistent with abnormal left ventricular  relaxation (grade 1 diastolic dysfunction).  - Aortic valve: A bioprosthesis was present and functioning  normally.  - Mitral valve: Moderately to severely calcified annulus. Mildly  thickened leaflets .  - Left atrium: The atrium was moderately dilated.  - Pulmonary arteries: PA peak pressure: 33 mm Hg (S).  Assessment & Plan   1.  Coronary artery disease-status post CABG x4 with AVR.  No chest pain today.  Continues to be physically active.  Echocardiogram 1/17 showed normal LV function, G1 DD, and normal bioprosthetic aortic valve.  Due to his report. Continue aspirin, lovastatin, SBE prophylaxis Heart healthy low-sodium diet-salty 6 given Increase physical activity as tolerated  Hyperlipidemia-.  LDL 54 on 07/08/2019 Continue lovastatin Heart healthy low-sodium high-fiber diet Increase physical activity as tolerated  Palpitations-denies recent episodes of irregular heartbeat or accelerated beats. Continue to monitor  Preoperative cardiac evaluation-excision of cyst, debridement of right index finger 07/14/2020  Primary Cardiologist: [name], MD  Chart reviewed as part of pre-operative protocol coverage. Given past medical history and time since last visit, based on ACC/AHA guidelines, SIREN PORRATA would be at acceptable risk for the planned procedure without further cardiovascular testing.   Her RCRI is class II risk, 0.9% risk of major cardiac event.  She is able to  complete greater than 4 METS of physical activity.  Her aspirin may be held for 5-7 days prior to her surgery.  Please resume as soon as hemostasis is achieved.  Patient was advised that if she develops new symptoms prior to surgery to contact our office to arrange a follow-up appointment.  He verbalized understanding.  I will route this recommendation to the requesting party via Epic fax function and remove from pre-op pool.  Please call with questions.    Disposition: Follow-up with Dr. Stanford Breed in 12 months.   Jossie Parker. Leonard Feigel NP-C    07/10/2020, 9:50 AM [address] Office [phone] Fax [phone]  Notice: This dictation was prepared with Dragon dictation along with smaller phrase technology. Any transcriptional errors that result from this process are unintentional and may not be corrected upon review.  I spent 15 minutes examining this patient, reviewing medications, and using patient centered shared decision making involving her cardiac care.  Prior to her visit I spent greater than 20 minutes reviewing her past medical history,  medications, and prior cardiac tests.

## 2020-07-10 ENCOUNTER — Ambulatory Visit: Payer: Medicare HMO | Admitting: General Practice

## 2020-07-10 ENCOUNTER — Other Ambulatory Visit (HOSPITAL_COMMUNITY)
Admission: RE | Admit: 2020-07-10 | Discharge: 2020-07-10 | Disposition: A | Discharge status: Home / Self Care | Payer: Medicare HMO | Source: Ambulatory Visit | Attending: Orthopedic Surgery | Admitting: Orthopedic Surgery

## 2020-07-10 ENCOUNTER — Other Ambulatory Visit: Payer: Self-pay

## 2020-07-10 ENCOUNTER — Telehealth: Payer: Self-pay | Admitting: General Practice

## 2020-07-10 ENCOUNTER — Encounter: Payer: Self-pay | Admitting: General Practice

## 2020-07-10 VITALS — BP 122/62 | HR 75 | Ht 63.0 in | Wt 122.2 lb

## 2020-07-10 DIAGNOSIS — Z953 Presence of xenogenic heart valve: Secondary | ICD-10-CM | POA: Diagnosis not present

## 2020-07-10 DIAGNOSIS — I2510 Atherosclerotic heart disease of native coronary artery without angina pectoris: Secondary | ICD-10-CM | POA: Diagnosis not present

## 2020-07-10 DIAGNOSIS — Z20822 Contact with and (suspected) exposure to covid-19: Secondary | ICD-10-CM | POA: Insufficient documentation

## 2020-07-10 DIAGNOSIS — Z01810 Encounter for preprocedural cardiovascular examination: Secondary | ICD-10-CM

## 2020-07-10 DIAGNOSIS — Z01812 Encounter for preprocedural laboratory examination: Secondary | ICD-10-CM | POA: Insufficient documentation

## 2020-07-10 DIAGNOSIS — E7800 Pure hypercholesterolemia, unspecified: Secondary | ICD-10-CM

## 2020-07-10 LAB — SARS CORONAVIRUS 2 (TAT 6-24 HRS): SARS Coronavirus 2: NEGATIVE

## 2020-07-10 NOTE — Telephone Encounter (Signed)
Follow up:     Patient checking the status of her pre-op.

## 2020-07-10 NOTE — Telephone Encounter (Signed)
   Patient seen in the office today by Coletta Memos, NP. Re-faxed office note and original clearance request.  Callback: - Please contact the requesting office and ensure fax was received. Thank you!  Abigail Butts, PA-C 07/10/20; 3:43 PM

## 2020-07-10 NOTE — Patient Instructions (Signed)
Medication Instructions:  The current medical regimen is effective;  continue present plan and medications as directed. Please refer to the Current Medication list given to you today.  *If you need a refill on your cardiac medications before your next appointment, please call your pharmacy*  Lab Work:   Testing/Procedures:  NONE    NONE  Special Instructions PLEASE READ AND FOLLOW SALTY 6-ATTACHED-1,800mg  daily  PLEASE INCREASE PHYSICAL ACTIVITY AS TOLERATED  Follow-Up: Your next appointment:  12 month(s) In Person with You may see [name], MD OR IF UNAVAILABLE JESSE CLEAVER, FNP-C or one of the following Advanced Practice Providers on your designated Care Team:  Sande Rives, PA-C   Please call our office 2 months in advance to schedule this appointment   At [facility], you and your health needs are our priority.  As part of our continuing mission to provide you with exceptional heart care, we have created designated Provider Care Teams.  These Care Teams include your primary Cardiologist (physician) and Advanced Practice Providers (APPs -  Physician Assistants and Nurse Practitioners) who all work together to provide you with the care you need, when you need it.            6 SALTY THINGS TO AVOID     1,800MG  DAILY

## 2020-07-10 NOTE — Telephone Encounter (Signed)
I s/w Dr. Levell July office and have confirmed clearance has been received by their office.

## 2020-07-14 ENCOUNTER — Ambulatory Visit (HOSPITAL_BASED_OUTPATIENT_CLINIC_OR_DEPARTMENT_OTHER): Payer: Medicare HMO | Admitting: Certified Registered"

## 2020-07-14 ENCOUNTER — Encounter (HOSPITAL_BASED_OUTPATIENT_CLINIC_OR_DEPARTMENT_OTHER)
Admission: RE | Disposition: A | Discharge status: Home / Self Care | Payer: Self-pay | Source: Home / Self Care | Attending: Orthopedic Surgery

## 2020-07-14 ENCOUNTER — Other Ambulatory Visit: Payer: Self-pay

## 2020-07-14 ENCOUNTER — Encounter (HOSPITAL_BASED_OUTPATIENT_CLINIC_OR_DEPARTMENT_OTHER): Payer: Self-pay | Admitting: Orthopedic Surgery

## 2020-07-14 ENCOUNTER — Ambulatory Visit (HOSPITAL_BASED_OUTPATIENT_CLINIC_OR_DEPARTMENT_OTHER)
Admission: RE | Admit: 2020-07-14 | Discharge: 2020-07-14 | Disposition: A | Discharge status: Home / Self Care | Payer: Medicare HMO | Attending: Orthopedic Surgery | Admitting: Orthopedic Surgery

## 2020-07-14 DIAGNOSIS — Z951 Presence of aortocoronary bypass graft: Secondary | ICD-10-CM | POA: Diagnosis not present

## 2020-07-14 DIAGNOSIS — I2510 Atherosclerotic heart disease of native coronary artery without angina pectoris: Secondary | ICD-10-CM | POA: Diagnosis not present

## 2020-07-14 DIAGNOSIS — Z809 Family history of malignant neoplasm, unspecified: Secondary | ICD-10-CM | POA: Insufficient documentation

## 2020-07-14 DIAGNOSIS — E039 Hypothyroidism, unspecified: Secondary | ICD-10-CM | POA: Diagnosis not present

## 2020-07-14 DIAGNOSIS — Z888 Allergy status to other drugs, medicaments and biological substances status: Secondary | ICD-10-CM | POA: Insufficient documentation

## 2020-07-14 DIAGNOSIS — Z881 Allergy status to other antibiotic agents status: Secondary | ICD-10-CM | POA: Insufficient documentation

## 2020-07-14 DIAGNOSIS — M25741 Osteophyte, right hand: Secondary | ICD-10-CM | POA: Diagnosis not present

## 2020-07-14 DIAGNOSIS — M24041 Loose body in right finger joint(s): Secondary | ICD-10-CM | POA: Diagnosis not present

## 2020-07-14 DIAGNOSIS — Z8249 Family history of ischemic heart disease and other diseases of the circulatory system: Secondary | ICD-10-CM | POA: Diagnosis not present

## 2020-07-14 DIAGNOSIS — Z882 Allergy status to sulfonamides status: Secondary | ICD-10-CM | POA: Diagnosis not present

## 2020-07-14 DIAGNOSIS — Z96651 Presence of right artificial knee joint: Secondary | ICD-10-CM | POA: Insufficient documentation

## 2020-07-14 DIAGNOSIS — M19041 Primary osteoarthritis, right hand: Secondary | ICD-10-CM | POA: Diagnosis not present

## 2020-07-14 DIAGNOSIS — M25841 Other specified joint disorders, right hand: Secondary | ICD-10-CM | POA: Insufficient documentation

## 2020-07-14 DIAGNOSIS — Z952 Presence of prosthetic heart valve: Secondary | ICD-10-CM | POA: Insufficient documentation

## 2020-07-14 DIAGNOSIS — M7989 Other specified soft tissue disorders: Secondary | ICD-10-CM | POA: Diagnosis not present

## 2020-07-14 HISTORY — DX: Atherosclerotic heart disease of native coronary artery without angina pectoris: I25.10

## 2020-07-14 HISTORY — PX: MASS EXCISION: SHX2000

## 2020-07-14 SURGERY — EXCISION MASS
Anesthesia: General | Site: Index Finger | Laterality: Right

## 2020-07-14 MED ORDER — OXYCODONE HCL 5 MG PO TABS: 5.0000 mg | ORAL_TABLET | Freq: Once | ORAL | Status: DC | PRN

## 2020-07-14 MED ORDER — LIDOCAINE 2% (20 MG/ML) 5 ML SYRINGE
INTRAMUSCULAR | Status: DC | PRN
  Administered 2020-07-14: 60 mg via INTRAVENOUS

## 2020-07-14 MED ORDER — TRAMADOL HCL 50 MG PO TABS: 50.0000 mg | ORAL_TABLET | Freq: Four times a day (QID) | ORAL | 0 refills | Status: DC | PRN

## 2020-07-14 MED ORDER — PROPOFOL 10 MG/ML IV BOLUS
INTRAVENOUS | Status: DC | PRN
  Administered 2020-07-14: 100 mg via INTRAVENOUS

## 2020-07-14 MED ORDER — LACTATED RINGERS IV SOLN: INTRAVENOUS | Status: DC

## 2020-07-14 MED ORDER — CEFAZOLIN SODIUM-DEXTROSE 2-4 GM/100ML-% IV SOLN
2.0000 g | INTRAVENOUS | Status: AC
  Administered 2020-07-14: 2 g via INTRAVENOUS

## 2020-07-14 MED ORDER — FENTANYL CITRATE (PF) 100 MCG/2ML IJ SOLN
INTRAMUSCULAR | Status: DC | PRN
  Administered 2020-07-14: 25 ug via INTRAVENOUS

## 2020-07-14 MED ORDER — FENTANYL CITRATE (PF) 100 MCG/2ML IJ SOLN
INTRAMUSCULAR | Status: AC
  Filled 2020-07-14: qty 2

## 2020-07-14 MED ORDER — ONDANSETRON HCL 4 MG/2ML IJ SOLN: 4.0000 mg | Freq: Once | INTRAMUSCULAR | Status: DC | PRN

## 2020-07-14 MED ORDER — DEXAMETHASONE SODIUM PHOSPHATE 10 MG/ML IJ SOLN
INTRAMUSCULAR | Status: DC | PRN
  Administered 2020-07-14: 4 mg via INTRAVENOUS

## 2020-07-14 MED ORDER — CEFAZOLIN SODIUM-DEXTROSE 2-4 GM/100ML-% IV SOLN
INTRAVENOUS | Status: AC
  Filled 2020-07-14: qty 100

## 2020-07-14 MED ORDER — ONDANSETRON HCL 4 MG/2ML IJ SOLN
INTRAMUSCULAR | Status: DC | PRN
  Administered 2020-07-14: 4 mg via INTRAVENOUS

## 2020-07-14 MED ORDER — OXYCODONE HCL 5 MG/5ML PO SOLN: 5.0000 mg | Freq: Once | ORAL | Status: DC | PRN

## 2020-07-14 MED ORDER — BUPIVACAINE HCL (PF) 0.25 % IJ SOLN
INTRAMUSCULAR | Status: DC | PRN
  Administered 2020-07-14: 6 mL

## 2020-07-14 MED ORDER — FENTANYL CITRATE (PF) 100 MCG/2ML IJ SOLN
25.0000 ug | INTRAMUSCULAR | Status: DC | PRN
Start: 2020-07-14 — End: 2020-07-14

## 2020-07-14 SURGICAL SUPPLY — 52 items
APL PRP STRL LF DISP 70% ISPRP (MISCELLANEOUS) ×1
BLADE MINI RND TIP GREEN BEAV (BLADE) IMPLANT
BLADE SURG 15 STRL LF DISP TIS (BLADE) ×1 IMPLANT
BLADE SURG 15 STRL SS (BLADE) ×2
BNDG CMPR 9X4 STRL LF SNTH (GAUZE/BANDAGES/DRESSINGS) ×1
BNDG COHESIVE 1X5 TAN STRL LF (GAUZE/BANDAGES/DRESSINGS) ×2 IMPLANT
BNDG COHESIVE 2X5 TAN STRL LF (GAUZE/BANDAGES/DRESSINGS) IMPLANT
BNDG COHESIVE 3X5 TAN STRL LF (GAUZE/BANDAGES/DRESSINGS) IMPLANT
BNDG ESMARK 4X9 LF (GAUZE/BANDAGES/DRESSINGS) ×1 IMPLANT
BNDG GAUZE ELAST 4 BULKY (GAUZE/BANDAGES/DRESSINGS) IMPLANT
CHLORAPREP W/TINT 26 (MISCELLANEOUS) ×2 IMPLANT
CORD BIPOLAR FORCEPS 12FT (ELECTRODE) ×1 IMPLANT
COVER BACK TABLE 60X90IN (DRAPES) ×2 IMPLANT
COVER MAYO STAND STRL (DRAPES) ×2 IMPLANT
COVER WAND RF STERILE (DRAPES) IMPLANT
CUFF TOURN SGL QUICK 18X4 (TOURNIQUET CUFF) ×1 IMPLANT
DECANTER SPIKE VIAL GLASS SM (MISCELLANEOUS) IMPLANT
DRAIN PENROSE 1/2X12 LTX STRL (WOUND CARE) IMPLANT
DRAPE EXTREMITY T 121X128X90 (DISPOSABLE) ×2 IMPLANT
DRAPE SURG 17X23 STRL (DRAPES) ×2 IMPLANT
GAUZE SPONGE 4X4 12PLY STRL (GAUZE/BANDAGES/DRESSINGS) ×2 IMPLANT
GAUZE XEROFORM 1X8 LF (GAUZE/BANDAGES/DRESSINGS) ×2 IMPLANT
GLOVE SURG LTX SZ6.5 (GLOVE) ×1 IMPLANT
GLOVE SURG ORTHO LTX SZ8 (GLOVE) ×2 IMPLANT
GLOVE SURG UNDER POLY LF SZ7 (GLOVE) ×1 IMPLANT
GLOVE SURG UNDER POLY LF SZ8.5 (GLOVE) ×2 IMPLANT
GOWN STRL REUS W/ TWL LRG LVL3 (GOWN DISPOSABLE) ×1 IMPLANT
GOWN STRL REUS W/TWL LRG LVL3 (GOWN DISPOSABLE) ×2
GOWN STRL REUS W/TWL XL LVL3 (GOWN DISPOSABLE) ×2 IMPLANT
NDL PRECISIONGLIDE 27X1.5 (NEEDLE) IMPLANT
NDL SAFETY ECLIPSE 18X1.5 (NEEDLE) ×1 IMPLANT
NEEDLE HYPO 18GX1.5 SHARP (NEEDLE)
NEEDLE PRECISIONGLIDE 27X1.5 (NEEDLE) ×2 IMPLANT
NS IRRIG 1000ML POUR BTL (IV SOLUTION) ×2 IMPLANT
PACK BASIN DAY SURGERY FS (CUSTOM PROCEDURE TRAY) ×2 IMPLANT
PAD CAST 3X4 CTTN HI CHSV (CAST SUPPLIES) IMPLANT
PADDING CAST ABS 3INX4YD NS (CAST SUPPLIES)
PADDING CAST ABS 4INX4YD NS (CAST SUPPLIES)
PADDING CAST ABS COTTON 3X4 (CAST SUPPLIES) IMPLANT
PADDING CAST ABS COTTON 4X4 ST (CAST SUPPLIES) ×1 IMPLANT
PADDING CAST COTTON 3X4 STRL (CAST SUPPLIES)
SPLINT PLASTER CAST XFAST 3X15 (CAST SUPPLIES) IMPLANT
SPLINT PLASTER XTRA FASTSET 3X (CAST SUPPLIES)
STOCKINETTE 4X48 STRL (DRAPES) ×2 IMPLANT
SUT ETHILON 4 0 PS 2 18 (SUTURE) ×2 IMPLANT
SUT ETHILON 5 0 P 3 18 (SUTURE) ×2
SUT NYLON ETHILON 5-0 P-3 1X18 (SUTURE) ×1 IMPLANT
SUT VIC AB 4-0 P2 18 (SUTURE) IMPLANT
SYR BULB EAR ULCER 3OZ GRN STR (SYRINGE) ×2 IMPLANT
SYR CONTROL 10ML LL (SYRINGE) ×1 IMPLANT
TOWEL GREEN STERILE FF (TOWEL DISPOSABLE) ×3 IMPLANT
UNDERPAD 30X36 HEAVY ABSORB (UNDERPADS AND DIAPERS) ×1 IMPLANT

## 2020-07-14 NOTE — Anesthesia Preprocedure Evaluation (Signed)
Anesthesia Evaluation  Patient identified by MRN, date of birth, ID band Patient awake    Reviewed: Allergy & Precautions, NPO status , Patient's Chart, lab work & pertinent test results  Airway Mallampati: II  TM Distance: >3 FB Neck ROM: Full    Dental  (+) Teeth Intact, Dental Advisory Given   Pulmonary    breath sounds clear to auscultation       Cardiovascular  Rhythm:Regular Rate:Normal     Neuro/Psych    GI/Hepatic   Endo/Other    Renal/GU      Musculoskeletal   Abdominal   Peds  Hematology   Anesthesia Other Findings   Reproductive/Obstetrics                             Anesthesia Physical Anesthesia Plan  ASA: III  Anesthesia Plan: General   Post-op Pain Management:    Induction: Intravenous  PONV Risk Score and Plan: Ondansetron and Dexamethasone  Airway Management Planned: LMA  Additional Equipment:   Intra-op Plan:   Post-operative Plan:   Informed Consent: I have reviewed the patients History and Physical, chart, labs and discussed the procedure including the risks, benefits and alternatives for the proposed anesthesia with the patient or authorized representative who has indicated his/her understanding and acceptance.     Dental advisory given  Plan Discussed with: CRNA and Anesthesiologist  Anesthesia Plan Comments: (Plan GA with LMA and digital block.  Roberts Gaudy)        Anesthesia Quick Evaluation

## 2020-07-14 NOTE — Brief Op Note (Signed)
07/14/2020  10:25 AM  PATIENT:  Era Bumpers  85 y.o. 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  PROCEDURE:  Procedure(s) with comments: EXCISION CYST, DEBRIDEMENT DISTAL INTERPHALANGEAL JOINT RIGHT INDEX FINGER (Right) - IV REGIONAL FOREARM BLOCK; 60 MINUTES  SURGEON:  Surgeon(s) and Role:    * Daryll Brod, MD - Primary  PHYSICIAN ASSISTANT:   ASSISTANTS: none   ANESTHESIA:   local and general  EBL: 62ml BLOOD ADMINISTERED:none  DRAINS: none   LOCAL MEDICATIONS USED:  BUPIVICAINE   SPECIMEN:  Excision  DISPOSITION OF SPECIMEN:  PATHOLOGY  COUNTS:  YES  TOURNIQUET:   Total Tourniquet Time Documented: Upper Arm (Right) - 19 minutes Total: Upper Arm (Right) - 19 minutes   DICTATION: .Viviann Spare Dictation  PLAN OF CARE: Discharge to home after PACU  PATIENT DISPOSITION:  PACU - hemodynamically stable.

## 2020-07-14 NOTE — Discharge Instructions (Signed)

## 2020-07-14 NOTE — Op Note (Signed)
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   ASSISTANT: none   ANESTHESIA:  General and Local   INTRAVENOUS FLUIDS:  Per anesthesia flow sheet.   ESTIMATED BLOOD LOSS:  Minimal.   COMPLICATIONS:  None.   SPECIMENS:   Cyst osteophytes and synovial tissue   TOURNIQUET TIME:    Total Tourniquet Time Documented: Upper Arm (Right) - 19 minutes Total: Upper Arm (Right) - 19 minutes    DISPOSITION:  Stable to PACU.   INDICATIONS: Patient is a 85 year old female with a history of a mass in the distal phalangeal joint with plate deformity right index finger significant arthritis present to the distal phalangeal joints of all digits.  He is desirous of having the mass removed which is tenting the skin with debridement of the joint effort to prevent recurrence.  Preperi-and postoperative course been discussed along with risks and complications.  She is aware there is no guarantee to the surgery the possibility of infection recurrence injury to arteries nerves tendons incomplete relief symptoms dystrophy.  Preoperative area the patient is seen the extremity marked by both patient and surgeon antibiotic given  OPERATIVE COURSE: Patient is brought the operating room where general anesthetic was carried out without difficulty under the direction the anesthesia department.  After being placed in the supine position with the right arm free.  Prep was done with ChloraPrep.  3-minute dry time was allowed timeout taken to confirm patient  procedure.  A metacarpal block was given with quarter percent bupivacaine without epinephrine approximately 8 cc was used.  The limb was exsanguinated with an Esmarch bandage tourniquet inflated to 250 mmHg.  A curvilinear incision was made over the dorsal aspect of the distal phalangeal joint and along the mid lateral line of the index finger right hand.  This carried down through subcutaneous tissue.  The skin was elevated distally allowing the dissection could be carried beneath the skin to the level of the cyst which was then isolated with a hemostatic rondure and a house curette.  This was removed.  The joint was then opened on its ulnar border of the extensor tendon.  A large osteophyte was removed from the extensor tendon was free and joint not attached to the distal phalanx.  Was done with the hemostatic rondure and a house curette preserving the extensor tendon.  The specimen was sent to pathology.  A synovectomy of the joint was then performed.  Wound was copiously irrigated with saline.  The skin was closed erupted 5-0 nylon sutures.  A compressive dressing and splint to the finger was applied.  Deflation of the tourniquet remaining fingers pink.  She was taken to the recovery room for observation in satisfactory condition.  She will be discharged home to return to the [facility] [place] in 1 week on Ultram.   Daryll Brod, MD Electronically signed, 07/14/20

## 2020-07-14 NOTE — H&P (Signed)
Linda Parker is an 85 y.o. female.   Chief Complaint: mass right index finger HPI: Tommie Dejoseph an 85 year old right-hand-dominant former patient who returns the office today for reexamination of a questionable mass right index finger. She was last seen in August 2021. He comes in today complaining of right index finger. She has had quite a cyst removed in the past. She states that it has enlarged. It is painful only if she hits the area she has a history of arthritis including involvement of all PIP and DIP joints. She is not complaining of other digits at the present time. She has a history of thyroid problems arthritis no history of diabetes or gout. Family history is positive arthritis negative for the remainder.    Past Medical History:  Diagnosis Date  . Allergy   . Aortic stenosis    s/p AVR w/ 23 mm Edward Magna-ease Pericardial Valve  . Arthritis    hands & knee- L   . Coronary artery disease   . Dysrhythmia    when taking antihistamines  . Heart murmur   . History of echocardiogram    Echo (8/15): EF 55-60%, normal wall motion, grade 1 diastolic dysfunction, AVR okay (mean 10 mm Hg), normal RV function, moderate TR  . Osteoporosis   . Palpitations   . Unspecified hypothyroidism   . Vertigo     Past Surgical History:  Procedure Laterality Date  . ABDOMINAL HYSTERECTOMY    . AORTIC VALVE REPLACEMENT N/A 09/24/2013   Procedure: AORTIC VALVE REPLACEMENT (AVR) using Pericardial Tissue Valve (size 42mm).;  Surgeon: Gaye Pollack, MD;  Location: [facility];  Service: Open Heart Surgery;  Laterality: N/A;  . APPENDECTOMY     at time of hysterectomy  . bilateral knee surgery  35 yrs. ago   cartilage removed from knees  . CARDIAC CATHETERIZATION    . CARDIAC VALVE REPLACEMENT    . CORONARY ARTERY BYPASS GRAFT N/A 09/24/2013   Procedure: CORONARY ARTERY BYPASS GRAFTING (CABG) x4: LIMA to LAD, SVG to Diagonal 1, SVG to Diagonal 2, Svg to OM 1.;  Surgeon: Gaye Pollack, MD;  Location: [facility];  Service: Open Heart Surgery;  Laterality: N/A;  . FINGER SURGERY     left hand  . INJECTION KNEE Left 03/04/2013   Procedure: left KNEE cortisone INJECTION;  Surgeon: Gearlean Alf, MD;  Location: [facility];  Service: Orthopedics;  Laterality: Left;  . INTRAOPERATIVE TRANSESOPHAGEAL ECHOCARDIOGRAM N/A 09/24/2013   Procedure: INTRAOPERATIVE TRANSESOPHAGEAL ECHOCARDIOGRAM;  Surgeon: Gaye Pollack, MD;  Location: [facility] OR;  Service: Open Heart Surgery;  Laterality: N/A;  . LEFT AND RIGHT HEART CATHETERIZATION WITH CORONARY ANGIOGRAM N/A 09/17/2013   Procedure: LEFT AND RIGHT HEART CATHETERIZATION WITH CORONARY ANGIOGRAM;  Surgeon: Blane Ohara, MD;  Location: [facility] CATH LAB;  Service: Cardiovascular;  Laterality: N/A;  . MASS EXCISION Right 05/17/2018   Procedure: EXCISION CYST RIGHT RING FINGER, DEBRIDEMENT DISTAL INTERPHALANGEAL RIGHT FINGER;  Surgeon: Daryll Brod, MD;  Location: [facility];  Service: Orthopedics;  Laterality: Right;  . TONSILLECTOMY    . TOTAL KNEE ARTHROPLASTY Right 03/04/2013   Procedure: RIGHT TOTAL KNEE ARTHROPLASTY;  Surgeon: Gearlean Alf, MD;  Location: [facility];  Service: Orthopedics;  Laterality: Right;    Family History  Problem Relation Age of Onset  . Heart disease Mother   . Cancer Father    Social History:  reports that she has never smoked. She has never used smokeless tobacco. She reports  current alcohol use of about 1.0 standard drink of alcohol per week. She reports that she does not use drugs.  Allergies:  Allergies  Allergen Reactions  . Erythromycin Nausea Only    Feels like she is dying/out of her body  . Erythromycin Base Other (See Comments)    Has out of body feeling  . Monosodium Glutamate Other (See Comments)    Migraine headache   . Nitrates, Organic     headache  . Sulfamethoxazole Other (See Comments)    Makes her feel strange  . Sulfonamide Derivatives Nausea Only    Feels like she is dying/out of her body  .  Ciprofloxacin Palpitations    Feels out of her body experience  . Versed [Midazolam] Other (See Comments)    Pt experienced forgetfulness for days after and requests we do not use.    No medications prior to admission.    No results found for this or any previous visit (from the past 48 hour(s)).  No results found.   Pertinent items are noted in HPI.  Height 5\' 3"  (1.6 m), weight 53.5 kg.  General appearance: alert, cooperative and appears stated age Head: Normocephalic, without obvious abnormality Neck: no JVD Resp: clear to auscultation bilaterally Cardio: regular rate and rhythm, S1, S2 normal, no murmur, click, rub or gallop GI: soft, non-tender; bowel sounds normal; no masses,  no organomegaly Extremities: mass right index finger Pulses: 2+ and symmetric Skin: Skin color, texture, turgor normal. No rashes or lesions Neurologic: Grossly normal Incision/Wound: na  Assessment/Plan Assessment:  1. Osteoarthritis of finger of right hand  2. Mucoid cyst, joint    Plan: She would like to proceed to have this surgically taken care of. We have discussed possibility of fusion of the joint which she is not interested in having done at the present time. She is scheduled for debridement of the joint along with excision of the cyst. Prepare postoperative course been discussed along with risks and complications. She is aware there is no guarantee to the surgery possibility of infection recurrence injury to arteries nerves tendons complete relief symptoms dystrophy the possibility of stiffness to the joint possibility of a fusion being necessary in the future. She is scheduled for excision mass debridement distal phalangeal joint right index finger as an outpatient under regional anesthesia.     Daryll Brod 07/14/2020, 5:45 AM

## 2020-07-14 NOTE — Anesthesia Postprocedure Evaluation (Signed)
Anesthesia Post Note  Patient: Linda Parker  Procedure(s) Performed: EXCISION CYST, DEBRIDEMENT DISTAL INTERPHALANGEAL JOINT RIGHT INDEX FINGER (Right Index Finger)     Patient location during evaluation: PACU Anesthesia Type: General Level of consciousness: awake and alert Pain management: pain level controlled Vital Signs Assessment: post-procedure vital signs reviewed and stable Respiratory status: spontaneous breathing, nonlabored ventilation, respiratory function stable and patient connected to nasal cannula oxygen Cardiovascular status: blood pressure returned to baseline and stable Postop Assessment: no apparent nausea or vomiting Anesthetic complications: no   No complications documented.  Last Vitals:  Vitals:   07/14/20 1045 07/14/20 1115  BP: 122/67 134/81  Pulse: 86 86  Resp: 18 16  Temp:  36.6 C  SpO2: 97% 96%    Last Pain:  Vitals:   07/14/20 1115  TempSrc:   PainSc: 0-No pain                 Aly Hauser COKER

## 2020-07-14 NOTE — Anesthesia Procedure Notes (Signed)
Procedure Name: LMA Insertion Date/Time: 07/14/2020 10:02 AM Performed by: Signe Colt, CRNA Pre-anesthesia Checklist: Patient identified, Emergency Drugs available, Suction available and Patient being monitored Patient Re-evaluated:Patient Re-evaluated prior to induction Oxygen Delivery Method: Circle System Utilized Preoxygenation: Pre-oxygenation with 100% oxygen Induction Type: IV induction Ventilation: Mask ventilation without difficulty LMA: LMA inserted LMA Size: 3.0 Number of attempts: 1 Airway Equipment and Method: bite block Placement Confirmation: positive ETCO2 Tube secured with: Tape Dental Injury: Teeth and Oropharynx as per pre-operative assessment

## 2020-07-14 NOTE — Transfer of Care (Signed)
Immediate Anesthesia Transfer of Care Note  Patient: Linda Parker  Procedure(s) Performed: EXCISION CYST, DEBRIDEMENT DISTAL INTERPHALANGEAL JOINT RIGHT INDEX FINGER (Right Index Finger)  Patient Location: PACU  Anesthesia Type:General  Level of Consciousness: drowsy and patient cooperative  Airway & Oxygen Therapy: Patient Spontanous Breathing and Patient connected to face mask oxygen  Post-op Assessment: Report given to RN and Post -op Vital signs reviewed and stable  Post vital signs: Reviewed and stable  Last Vitals:  Vitals Value Taken Time  BP 122/62 07/14/20 1023  Temp    Pulse 91 07/14/20 1024  Resp 12 07/14/20 1024  SpO2 99 % 07/14/20 1024  Vitals shown include unvalidated device data.  Last Pain:  Vitals:   07/14/20 0835  TempSrc: Oral  PainSc: 0-No pain         Complications: No complications documented.

## 2020-07-15 ENCOUNTER — Encounter (HOSPITAL_BASED_OUTPATIENT_CLINIC_OR_DEPARTMENT_OTHER): Payer: Self-pay | Admitting: Orthopedic Surgery

## 2020-07-15 LAB — SURGICAL PATHOLOGY

## 2020-08-31 DIAGNOSIS — M8589 Other specified disorders of bone density and structure, multiple sites: Secondary | ICD-10-CM | POA: Diagnosis not present

## 2020-10-22 ENCOUNTER — Telehealth: Payer: Self-pay | Admitting: Cardiology

## 2020-10-22 NOTE — Telephone Encounter (Signed)
STAT if HR is under 50 or over 120 (normal HR is 60-100 beats per minute)  What is your heart rate? It is running from 104 to 110- dhe wanted you to know that she been taking a  Supplement K27  Do you have a log of your heart rate readings (document readings)? no  Do you have any other symptoms? extremely tired last 2 days

## 2020-10-22 NOTE — Telephone Encounter (Signed)
Spoke with pt who reports an elevated HR at for the past few days. She also has increased fatigue. She is well hydrated. She states her HR is regular and does not have a history of AF. She has been taking K27 supplement which advertises an increase in cardiac output and decrease in coronary calcium build up. I advised her to stay off the [place] for the next few days to see if this decreases her HR. If her HR continues to stay elevated, she needs to call the office to report. She was encouraged to stay hydrated as well.   Will forward to MD and his RN for review and follow up.

## 2020-10-23 NOTE — Telephone Encounter (Signed)
Spoke with pt, she reports her heart rate is running 85-90 bpm and she has no fatigue today. She will come by the office Tuesday next week for ekg.

## 2020-10-27 ENCOUNTER — Ambulatory Visit (INDEPENDENT_AMBULATORY_CARE_PROVIDER_SITE_OTHER): Payer: Medicare HMO | Admitting: *Deleted

## 2020-10-27 ENCOUNTER — Other Ambulatory Visit: Payer: Self-pay

## 2020-10-27 DIAGNOSIS — R002 Palpitations: Secondary | ICD-10-CM | POA: Diagnosis not present

## 2020-10-27 NOTE — Progress Notes (Signed)
Reason for visit: elevated heart rate and palpitations  Name of MD requesting visit: crenshaw  H&P: previous aortic valve replacement and palpitations  ROS related to problem: patient called into the office due to elevated pulse rate at times.   Assessment and plan per MD: EKG today is sinus rhythm with a rate of 87 bpm. She denies any chest pain or SOB. She has noticed a recent elevation in her heart rate after she had started taking a new supplement to help with the absorption of calcium into the bones. She has stopped taking that medication and has noticed a drop in her heart rate. She is here today to confirm she is in normal rhythm. She has a follw up appointment with dr Stanford Breed on 11/09/20. EKG confirmed by dr berry and sent for scanning.

## 2020-11-02 NOTE — Progress Notes (Signed)
HPI: FU AVR and CAD. Echocardiogram in May 2015 demonstrated severe aortic stenosis. She was symptomatic and referred for cardiac catheterization. This demonstrated 3 vessel CAD. She was referred for CABG and AVR. She was admitted 6/15 and underwent CABG (LIMA-LAD, SVG-D1, SVG-D2, SVG-OM1) and bioprosthetic AVR (23 mm Edwards magna-ease pericardial valve). Note preoperative carotid Dopplers May 2015 showed bilateral 1-39% stenosis. Last echocardiogram January 2017 showed normal LV function, grade 1 diastolic dysfunction, bioprosthetic aortic valve with mean gradient 9 mmHg and moderate left atrial enlargement. Since last seen, she has some DOE; also describes elevated HR; no exertional CP; has pain below left breast that increases with inspiration and certain movements.  Current Outpatient Medications  Medication Sig Dispense Refill   acetaminophen (TYLENOL) 325 MG tablet Take 650 mg by mouth every 6 (six) hours as needed.     Ascorbic Acid (VITAMIN C) POWD Take by mouth. Taking 1/4 teaspoon daily     aspirin 81 MG tablet Take 81 mg by mouth daily.     cholecalciferol (VITAMIN D) 1000 UNITS tablet Take 2,000 Units by mouth daily.      CINNAMON PO Take by mouth. Takes 1/4 teaspoon daily     estradiol (ESTRACE) 0.5 MG tablet Take 0.5 mg by mouth daily before breakfast.      folic acid (FOLVITE) 025 MCG tablet Take 400 mcg by mouth daily.     Glucosamine-Chondroitin (GLUCOSAMINE CHONDR COMPLEX PO) Take 1 tablet by mouth 2 (two) times daily.     levothyroxine (SYNTHROID, LEVOTHROID) 50 MCG tablet Take 50 mcg by mouth daily.      lovastatin (MEVACOR) 20 MG tablet Take 1 tablet (20 mg total) by mouth at bedtime. (Patient taking differently: Take 20 mg by mouth at bedtime. TAKES EVERY OTHER DAY) 90 tablet 3   omega-3 acid ethyl esters (LOVAZA) 1 G capsule Take 1 g by mouth 2 (two) times daily.     vitamin E 400 UNIT capsule Take 400 Units by mouth daily.     Cyanocobalamin (VITAMIN B-12 PO) Take  by mouth as directed. (Patient not taking: Reported on 11/09/2020)     traMADol (ULTRAM) 50 MG tablet Take 1 tablet (50 mg total) by mouth every 6 (six) hours as needed. (Patient not taking: Reported on 11/09/2020) 20 tablet 0   traMADol (ULTRAM) 50 MG tablet Take 1 tablet (50 mg total) by mouth every 6 (six) hours as needed. (Patient not taking: Reported on 11/09/2020) 20 tablet 0   VITAMIN K PO Take by mouth. (Patient not taking: Reported on 11/09/2020)     No current facility-administered medications for this visit.     Past Medical History:  Diagnosis Date   Allergy    Aortic stenosis    s/p AVR w/ 23 mm Edward Magna-ease Pericardial Valve   Arthritis    hands & knee- L    Coronary artery disease    Dysrhythmia    when taking antihistamines   Heart murmur    History of echocardiogram    Echo (8/15): EF 55-60%, normal wall motion, grade 1 diastolic dysfunction, AVR okay (mean 10 mm Hg), normal RV function, moderate TR   Osteoporosis    Palpitations    Unspecified hypothyroidism    Vertigo     Past Surgical History:  Procedure Laterality Date   ABDOMINAL HYSTERECTOMY     AORTIC VALVE REPLACEMENT N/A 09/24/2013   Procedure: AORTIC VALVE REPLACEMENT (AVR) using Pericardial Tissue Valve (size 25mm).;  Surgeon: Gaye Pollack,  MD;  Location: [facility];  Service: Open Heart Surgery;  Laterality: N/A;   APPENDECTOMY     at time of hysterectomy   bilateral knee surgery  35 yrs. ago   cartilage removed from knees   [place] GRAFT N/A 09/24/2013   Procedure: CORONARY ARTERY BYPASS GRAFTING (CABG) x4: LIMA to LAD, SVG to Diagonal 1, SVG to Diagonal 2, Svg to OM 1.;  Surgeon: Gaye Pollack, MD;  Location: [facility];  Service: Open Heart Surgery;  Laterality: N/A;   FINGER SURGERY     left hand   INJECTION KNEE Left 03/04/2013   Procedure: left KNEE cortisone INJECTION;  Surgeon: Gearlean Alf, MD;  Location: [facility];   Service: Orthopedics;  Laterality: Left;   INTRAOPERATIVE TRANSESOPHAGEAL ECHOCARDIOGRAM N/A 09/24/2013   Procedure: INTRAOPERATIVE TRANSESOPHAGEAL ECHOCARDIOGRAM;  Surgeon: Gaye Pollack, MD;  Location: [facility] OR;  Service: Open Heart Surgery;  Laterality: N/A;   LEFT AND RIGHT HEART CATHETERIZATION WITH CORONARY ANGIOGRAM N/A 09/17/2013   Procedure: LEFT AND RIGHT HEART CATHETERIZATION WITH CORONARY ANGIOGRAM;  Surgeon: Blane Ohara, MD;  Location: [facility] CATH LAB;  Service: Cardiovascular;  Laterality: N/A;   MASS EXCISION Right 05/17/2018   Procedure: EXCISION CYST RIGHT RING FINGER, DEBRIDEMENT DISTAL INTERPHALANGEAL RIGHT FINGER;  Surgeon: Daryll Brod, MD;  Location: [facility];  Service: Orthopedics;  Laterality: Right;   MASS EXCISION Right 07/14/2020   Procedure: EXCISION CYST, DEBRIDEMENT DISTAL INTERPHALANGEAL JOINT RIGHT INDEX FINGER;  Surgeon: Daryll Brod, MD;  Location: [facility];  Service: Orthopedics;  Laterality: Right;  IV REGIONAL FOREARM BLOCK; 60 MINUTES   TONSILLECTOMY     TOTAL KNEE ARTHROPLASTY Right 03/04/2013   Procedure: RIGHT TOTAL KNEE ARTHROPLASTY;  Surgeon: Gearlean Alf, MD;  Location: [facility];  Service: Orthopedics;  Laterality: Right;    Social History   Socioeconomic History   Marital status: Married    Spouse name: Not on file   Number of children: Not on file   Years of education: Not on file   Highest education level: Not on file  Occupational History   Not on file  Tobacco Use   Smoking status: Never   Smokeless tobacco: Never  Vaping Use   Vaping Use: Never used  Substance and Sexual Activity   Alcohol use: Yes    Alcohol/week: 1.0 standard drink    Types: 1 Glasses of wine per week    Comment: 4 oz nightly red wine   Drug use: No   Sexual activity: Not Currently    Birth control/protection: Post-menopausal, Surgical  Other Topics Concern   Not on file  Social History Narrative   Not on file   Social  Determinants of Health   Financial Resource Strain: Not on file  Food Insecurity: Not on file  Transportation Needs: Not on file  Physical Activity: Not on file  Stress: Not on file  Social Connections: Not on file  Intimate Partner Violence: Not on file    Family History  Problem Relation Age of Onset   Heart disease Mother    Cancer Father     ROS: no fevers or chills, productive cough, hemoptysis, dysphasia, odynophagia, melena, hematochezia, dysuria, hematuria, rash, seizure activity, orthopnea, PND, pedal edema, claudication. Remaining systems are negative.  Physical Exam: Well-developed well-nourished in no acute distress.  Skin is warm and dry.  HEENT is normal.  Neck is supple.  Chest is clear to auscultation with normal expansion.  Cardiovascular exam is regular rate and rhythm. 2/6 systolic murmur Abdominal exam nontender or distended. No masses palpated. Extremities show no edema. neuro grossly intact  ECG- 10/27/20 NSR, LAFB, no ST changes; personally reviewed  A/P  1 coronary artery disease-patient denies recurrent exertional chest pain.  Continue medical therapy with aspirin and statin.  2 status post aortic valve replacement-continue SBE prophylaxis.Some DOE; repeat echo.   3 hyperlipidemia-continue statin.  Note she has not tolerated higher doses previously. Lipids and liver monitored by primary care.  4 palpitations-can consider monitor for recurrent symptoms; could also consider beta blocker.  [name], MD

## 2020-11-09 ENCOUNTER — Ambulatory Visit (INDEPENDENT_AMBULATORY_CARE_PROVIDER_SITE_OTHER): Payer: Medicare HMO | Admitting: Cardiology

## 2020-11-09 ENCOUNTER — Other Ambulatory Visit: Payer: Self-pay

## 2020-11-09 ENCOUNTER — Encounter: Payer: Self-pay | Admitting: Cardiology

## 2020-11-09 VITALS — BP 135/79 | HR 83 | Ht 63.0 in | Wt 118.0 lb

## 2020-11-09 DIAGNOSIS — E7800 Pure hypercholesterolemia, unspecified: Secondary | ICD-10-CM | POA: Diagnosis not present

## 2020-11-09 DIAGNOSIS — Z953 Presence of xenogenic heart valve: Secondary | ICD-10-CM | POA: Diagnosis not present

## 2020-11-09 DIAGNOSIS — I2510 Atherosclerotic heart disease of native coronary artery without angina pectoris: Secondary | ICD-10-CM | POA: Diagnosis not present

## 2020-11-09 NOTE — Patient Instructions (Signed)

## 2020-11-11 DIAGNOSIS — M25511 Pain in right shoulder: Secondary | ICD-10-CM | POA: Diagnosis not present

## 2020-11-11 DIAGNOSIS — M25512 Pain in left shoulder: Secondary | ICD-10-CM | POA: Diagnosis not present

## 2020-11-24 ENCOUNTER — Ambulatory Visit (HOSPITAL_COMMUNITY): Payer: Medicare HMO | Attending: Cardiology

## 2020-11-24 ENCOUNTER — Other Ambulatory Visit: Payer: Self-pay

## 2020-11-24 DIAGNOSIS — Z953 Presence of xenogenic heart valve: Secondary | ICD-10-CM | POA: Diagnosis not present

## 2020-11-24 LAB — ECHOCARDIOGRAM COMPLETE
AV Mean grad: 10 mmHg
AV Peak grad: 19 mmHg
Ao pk vel: 2.18 m/s
Area-P 1/2: 3.15 cm2
S' Lateral: 3 cm

## 2020-11-26 ENCOUNTER — Telehealth: Payer: Self-pay | Admitting: Cardiology

## 2020-11-26 NOTE — Telephone Encounter (Signed)
New message:     Patient calling to get test results.

## 2020-11-26 NOTE — Telephone Encounter (Signed)
Called patient back, relayed result from Dr. Stanford Breed:  Lelon Perla, MD  11/25/2020 11:14 AM EDT      Normal LV function; normally functioning aortic valve Linda Parker   Patient verbalized understanding, all questions and concerns addressed at this time. Pt to call office if she has any further concerns.

## 2020-11-28 ENCOUNTER — Other Ambulatory Visit: Payer: Self-pay | Admitting: Cardiology

## 2020-11-30 ENCOUNTER — Other Ambulatory Visit (HOSPITAL_COMMUNITY): Payer: Medicare HMO

## 2020-12-29 DIAGNOSIS — M859 Disorder of bone density and structure, unspecified: Secondary | ICD-10-CM | POA: Diagnosis not present

## 2020-12-29 DIAGNOSIS — D692 Other nonthrombocytopenic purpura: Secondary | ICD-10-CM | POA: Diagnosis not present

## 2020-12-29 DIAGNOSIS — E7800 Pure hypercholesterolemia, unspecified: Secondary | ICD-10-CM | POA: Diagnosis not present

## 2020-12-29 DIAGNOSIS — Z0000 Encounter for general adult medical examination without abnormal findings: Secondary | ICD-10-CM | POA: Diagnosis not present

## 2020-12-29 DIAGNOSIS — E039 Hypothyroidism, unspecified: Secondary | ICD-10-CM | POA: Diagnosis not present

## 2020-12-30 DIAGNOSIS — H40053 Ocular hypertension, bilateral: Secondary | ICD-10-CM | POA: Diagnosis not present

## 2020-12-31 DIAGNOSIS — Z0100 Encounter for examination of eyes and vision without abnormal findings: Secondary | ICD-10-CM | POA: Diagnosis not present

## 2021-01-04 DIAGNOSIS — Z952 Presence of prosthetic heart valve: Secondary | ICD-10-CM | POA: Diagnosis not present

## 2021-01-04 DIAGNOSIS — Z1331 Encounter for screening for depression: Secondary | ICD-10-CM | POA: Diagnosis not present

## 2021-01-04 DIAGNOSIS — E7800 Pure hypercholesterolemia, unspecified: Secondary | ICD-10-CM | POA: Diagnosis not present

## 2021-01-04 DIAGNOSIS — I25810 Atherosclerosis of coronary artery bypass graft(s) without angina pectoris: Secondary | ICD-10-CM | POA: Diagnosis not present

## 2021-01-04 DIAGNOSIS — R82998 Other abnormal findings in urine: Secondary | ICD-10-CM | POA: Diagnosis not present

## 2021-01-04 DIAGNOSIS — E039 Hypothyroidism, unspecified: Secondary | ICD-10-CM | POA: Diagnosis not present

## 2021-01-04 DIAGNOSIS — D692 Other nonthrombocytopenic purpura: Secondary | ICD-10-CM | POA: Diagnosis not present

## 2021-01-04 DIAGNOSIS — I119 Hypertensive heart disease without heart failure: Secondary | ICD-10-CM | POA: Diagnosis not present

## 2021-01-04 DIAGNOSIS — M8580 Other specified disorders of bone density and structure, unspecified site: Secondary | ICD-10-CM | POA: Diagnosis not present

## 2021-01-04 DIAGNOSIS — Z0000 Encounter for general adult medical examination without abnormal findings: Secondary | ICD-10-CM | POA: Diagnosis not present

## 2021-01-04 DIAGNOSIS — Z1389 Encounter for screening for other disorder: Secondary | ICD-10-CM | POA: Diagnosis not present

## 2021-01-04 DIAGNOSIS — M1990 Unspecified osteoarthritis, unspecified site: Secondary | ICD-10-CM | POA: Diagnosis not present

## 2021-01-29 ENCOUNTER — Other Ambulatory Visit: Payer: Self-pay | Admitting: Family Medicine

## 2021-01-29 ENCOUNTER — Ambulatory Visit
Admission: RE | Admit: 2021-01-29 | Discharge: 2021-01-29 | Disposition: A | Discharge status: Home / Self Care | Payer: Medicare HMO | Source: Ambulatory Visit | Attending: Family Medicine | Admitting: Family Medicine

## 2021-01-29 ENCOUNTER — Telehealth: Payer: Self-pay | Admitting: Cardiology

## 2021-01-29 DIAGNOSIS — I119 Hypertensive heart disease without heart failure: Secondary | ICD-10-CM | POA: Diagnosis not present

## 2021-01-29 DIAGNOSIS — R0602 Shortness of breath: Secondary | ICD-10-CM

## 2021-01-29 DIAGNOSIS — Z952 Presence of prosthetic heart valve: Secondary | ICD-10-CM | POA: Diagnosis not present

## 2021-01-29 DIAGNOSIS — T148XXA Other injury of unspecified body region, initial encounter: Secondary | ICD-10-CM | POA: Diagnosis not present

## 2021-01-29 DIAGNOSIS — N281 Cyst of kidney, acquired: Secondary | ICD-10-CM | POA: Diagnosis not present

## 2021-01-29 DIAGNOSIS — I700 Atherosclerosis of aorta: Secondary | ICD-10-CM | POA: Diagnosis not present

## 2021-01-29 DIAGNOSIS — R079 Chest pain, unspecified: Secondary | ICD-10-CM

## 2021-01-29 DIAGNOSIS — M2578 Osteophyte, vertebrae: Secondary | ICD-10-CM | POA: Diagnosis not present

## 2021-01-29 DIAGNOSIS — I25810 Atherosclerosis of coronary artery bypass graft(s) without angina pectoris: Secondary | ICD-10-CM | POA: Diagnosis not present

## 2021-01-29 DIAGNOSIS — I77819 Aortic ectasia, unspecified site: Secondary | ICD-10-CM | POA: Diagnosis not present

## 2021-01-29 DIAGNOSIS — I2510 Atherosclerotic heart disease of native coronary artery without angina pectoris: Secondary | ICD-10-CM | POA: Diagnosis not present

## 2021-01-29 MED ORDER — IOPAMIDOL (ISOVUE-370) INJECTION 76%
75.0000 mL | Freq: Once | INTRAVENOUS | Status: AC | PRN
  Administered 2021-01-29: 75 mL via INTRAVENOUS

## 2021-01-29 NOTE — Telephone Encounter (Signed)
Spoke to patient Dr.Crenshaw's advice given. 

## 2021-01-29 NOTE — Telephone Encounter (Signed)
Spoke to patient she stated she has been having pain in left side under left breast for the past 2 months.Stated she saw PCP's PA today and she ordered a chest ct which showed a dilated aortic valve.She wanted Dr.Crenshaw to know.Advised Dr.Crenshaw is out of office.I will send message to him.

## 2021-01-29 NOTE — Telephone Encounter (Signed)
Patient states she just had a CT from Dr. Loren Racer office and the results should she is negative for suspected blood clot and lung cancer. She states it also found 3.1 dilation of the aortic valve. She would like Dr. Stanford Breed to review the results and have a nurse call her back.

## 2021-02-18 ENCOUNTER — Other Ambulatory Visit: Payer: Self-pay | Admitting: *Deleted

## 2021-02-18 DIAGNOSIS — I739 Peripheral vascular disease, unspecified: Secondary | ICD-10-CM

## 2021-02-19 ENCOUNTER — Telehealth: Payer: Self-pay | Admitting: Cardiology

## 2021-02-19 MED ORDER — AMOXICILLIN 500 MG PO TABS: ORAL_TABLET | ORAL | 3 refills | Status: DC

## 2021-02-19 NOTE — Telephone Encounter (Signed)
Spoke to patient she stated she will be going to dentist and her Amoxicillin is too old.Advised I will send refill to [facility].

## 2021-02-19 NOTE — Telephone Encounter (Signed)
New Message:     Patient said she need you to call in some Amoxicillin for her. She have the toothache and needs to go to the dentist.    *STAT* If patient is at the pharmacy, call can be transferred to refill team.   1. Which medications need to be refilled? (please list name of each medication and dose if known) new prescription forAmoxicillin  2. Which pharmacy/location (including street and city if local pharmacy) is medication to be sent to? [facility]  3. Do they need a 30 day or 90 day supply?

## 2021-02-22 NOTE — Progress Notes (Deleted)
[facility] [place]  ASSESSMENT / PLAN: 85 y.o. female with dilation of aortic arch (66mm) on recent CT angiogram of chest. This is asymptomatic. This lesion carries a <1% risk of rupture. Repeat CT chest without contrast in .   CHIEF COMPLAINT: ***  HISTORY OF PRESENT ILLNESS: Linda Parker is a 85 y.o. female ***  VASCULAR SURGICAL HISTORY: ***  VASCULAR RISK FACTORS: {FINDINGS; POSITIVE NEGATIVE:[id number]} history of stroke / transient ischemic attack. {FINDINGS; POSITIVE NEGATIVE:[id number]} history of coronary artery disease. *** history of PCI. *** history of CABG.  {FINDINGS; POSITIVE NEGATIVE:[id number]} history of diabetes mellitus. Last A1c ***. {FINDINGS; POSITIVE NEGATIVE:[id number]} history of smoking. *** actively smoking. {FINDINGS; POSITIVE NEGATIVE:[id number]} history of hypertension. *** drug regimen with *** control. {FINDINGS; POSITIVE NEGATIVE:[id number]} history of chronic kidney disease.  Last GFR ***. CKD {stage:30421363}. {FINDINGS; POSITIVE NEGATIVE:[id number]} history of chronic obstructive pulmonary disease, treated with ***.  FUNCTIONAL STATUS: ECOG performance status: {findings; ecog performance status:31780} Ambulatory status: {TNHAmbulation:25868}  Past Medical History:  Diagnosis Date   Allergy    Aortic stenosis    s/p AVR w/ 23 mm Edward Magna-ease Pericardial Valve   Arthritis    hands & knee- L    Coronary artery disease    Dysrhythmia    when taking antihistamines   Heart murmur    History of echocardiogram    Echo (8/15): EF 55-60%, normal wall motion, grade 1 diastolic dysfunction, AVR okay (mean 10 mm Hg), normal RV function, moderate TR   Osteoporosis    Palpitations    Unspecified hypothyroidism    Vertigo     Past Surgical History:  Procedure Laterality Date   ABDOMINAL HYSTERECTOMY     AORTIC VALVE REPLACEMENT N/A 09/24/2013   Procedure: AORTIC VALVE REPLACEMENT (AVR) using Pericardial Tissue Valve  (size 52mm).;  Surgeon: Gaye Pollack, MD;  Location: [facility] OR;  Service: Open Heart Surgery;  Laterality: N/A;   APPENDECTOMY     at time of hysterectomy   bilateral knee surgery  35 yrs. ago   cartilage removed from knees   [place] GRAFT N/A 09/24/2013   Procedure: CORONARY ARTERY BYPASS GRAFTING (CABG) x4: LIMA to LAD, SVG to Diagonal 1, SVG to Diagonal 2, Svg to OM 1.;  Surgeon: Gaye Pollack, MD;  Location: [facility] OR;  Service: Open Heart Surgery;  Laterality: N/A;   FINGER SURGERY     left hand   INJECTION KNEE Left 03/04/2013   Procedure: left KNEE cortisone INJECTION;  Surgeon: Gearlean Alf, MD;  Location: [facility] ORS;  Service: Orthopedics;  Laterality: Left;   INTRAOPERATIVE TRANSESOPHAGEAL ECHOCARDIOGRAM N/A 09/24/2013   Procedure: INTRAOPERATIVE TRANSESOPHAGEAL ECHOCARDIOGRAM;  Surgeon: Gaye Pollack, MD;  Location: [facility] OR;  Service: Open Heart Surgery;  Laterality: N/A;   LEFT AND RIGHT HEART CATHETERIZATION WITH CORONARY ANGIOGRAM N/A 09/17/2013   Procedure: LEFT AND RIGHT HEART CATHETERIZATION WITH CORONARY ANGIOGRAM;  Surgeon: Blane Ohara, MD;  Location: [facility] CATH LAB;  Service: Cardiovascular;  Laterality: N/A;   MASS EXCISION Right 05/17/2018   Procedure: EXCISION CYST RIGHT RING FINGER, DEBRIDEMENT DISTAL INTERPHALANGEAL RIGHT FINGER;  Surgeon: Daryll Brod, MD;  Location: [place];  Service: Orthopedics;  Laterality: Right;   MASS EXCISION Right 07/14/2020   Procedure: EXCISION CYST, DEBRIDEMENT DISTAL INTERPHALANGEAL JOINT RIGHT INDEX FINGER;  Surgeon: Daryll Brod, MD;  Location: [place];  Service: Orthopedics;  Laterality: Right;  IV  REGIONAL FOREARM BLOCK; 60 MINUTES   TONSILLECTOMY     TOTAL KNEE ARTHROPLASTY Right 03/04/2013   Procedure: RIGHT TOTAL KNEE ARTHROPLASTY;  Surgeon: Gearlean Alf, MD;  Location: [facility] OR[facility];  Service: Orthopedics;  Laterality: Right;    Family  History  Problem Relation Age of Onset   Heart disease Mother    Cancer Father     Social History   Socioeconomic History   Marital status: Married    Spouse name: Not on file   Number of children: Not on file   Years of education: Not on file   Highest education level: Not on file  Occupational History   Not on file  Tobacco Use   Smoking status: Never   Smokeless tobacco: Never  Vaping Use   Vaping Use: Never used  Substance and Sexual Activity   Alcohol use: Yes    Alcohol/week: 1.0 standard drink    Types: 1 Glasses of wine per week    Comment: 4 oz nightly red wine   Drug use: No   Sexual activity: Not Currently    Birth control/protection: Post-menopausal, Surgical  Other Topics Concern   Not on file  Social History Narrative   Not on file   Social Determinants of Health   Financial Resource Strain: Not on file  Food Insecurity: Not on file  Transportation Needs: Not on file  Physical Activity: Not on file  Stress: Not on file  Social Connections: Not on file  Intimate Partner Violence: Not on file    Allergies  Allergen Reactions   Erythromycin Nausea Only    Feels like she is dying/out of her body   Erythromycin Base Other (See Comments)    Has out of body feeling   Monosodium Glutamate Other (See Comments)    Migraine headache    Nitrates, Organic     headache   Sulfamethoxazole Other (See Comments)    Makes her feel strange   Sulfonamide Derivatives Nausea Only    Feels like she is dying/out of her body   Ciprofloxacin Palpitations    Feels out of her body experience   Versed [Midazolam] Other (See Comments)    Pt experienced forgetfulness for days after and requests we do not use.    Current Outpatient Medications  Medication Sig Dispense Refill   acetaminophen (TYLENOL) 325 MG tablet Take 650 mg by mouth every 6 (six) hours as needed.     amoxicillin (AMOXIL) 500 MG tablet Take 4 tablets ( 2 gms ) 1 hour before dental procedure 4 tablet  3   Ascorbic Acid (VITAMIN C) POWD Take by mouth. Taking 1/4 teaspoon daily     aspirin 81 MG tablet Take 81 mg by mouth daily.     cholecalciferol (VITAMIN D) 1000 UNITS tablet Take 2,000 Units by mouth daily.      CINNAMON PO Take by mouth. Takes 1/4 teaspoon daily     Cyanocobalamin (VITAMIN B-12 PO) Take by mouth as directed. (Patient not taking: Reported on 11/09/2020)     estradiol (ESTRACE) 0.5 MG tablet Take 0.5 mg by mouth daily before breakfast.      folic acid (FOLVITE) 505 MCG tablet Take 400 mcg by mouth daily.     Glucosamine-Chondroitin (GLUCOSAMINE CHONDR COMPLEX PO) Take 1 tablet by mouth 2 (two) times daily.     levothyroxine (SYNTHROID, LEVOTHROID) 50 MCG tablet Take 50 mcg by mouth daily.      lovastatin (MEVACOR) 20 MG tablet TAKE 1 TABLET (20 MG  TOTAL) BY MOUTH AT BEDTIME. 90 tablet 3   omega-3 acid ethyl esters (LOVAZA) 1 G capsule Take 1 g by mouth 2 (two) times daily.     traMADol (ULTRAM) 50 MG tablet Take 1 tablet (50 mg total) by mouth every 6 (six) hours as needed. (Patient not taking: Reported on 11/09/2020) 20 tablet 0   traMADol (ULTRAM) 50 MG tablet Take 1 tablet (50 mg total) by mouth every 6 (six) hours as needed. (Patient not taking: Reported on 11/09/2020) 20 tablet 0   vitamin E 400 UNIT capsule Take 400 Units by mouth daily.     VITAMIN K PO Take by mouth. (Patient not taking: Reported on 11/09/2020)     No current facility-administered medications for this visit.    REVIEW OF SYSTEMS:  [X]  denotes positive finding, [ ]  denotes negative finding Cardiac  Comments:  Chest pain or chest pressure: ***   Shortness of breath upon exertion:    Short of breath when lying flat:    Irregular heart rhythm:        Vascular    Pain in calf, thigh, or hip brought on by ambulation:    Pain in feet at night that wakes you up from your sleep:     Blood clot in your veins:    Leg swelling:         Pulmonary    Oxygen at home:    Productive cough:     Wheezing:          Neurologic    Sudden weakness in arms or legs:     Sudden numbness in arms or legs:     Sudden onset of difficulty speaking or slurred speech:    Temporary loss of vision in one eye:     Problems with dizziness:         Gastrointestinal    Blood in stool:     Vomited blood:         Genitourinary    Burning when urinating:     Blood in urine:        Psychiatric    Major depression:         Hematologic    Bleeding problems:    Problems with blood clotting too easily:        Skin    Rashes or ulcers:        Constitutional    Fever or chills:      PHYSICAL EXAM There were no vitals filed for this visit.  Constitutional: *** appearing. *** distress. Appears *** nourished.  Neurologic: CN ***. *** focal findings. *** sensory loss. Psychiatric: *** Mood and affect symmetric and appropriate. Eyes: *** No icterus. No conjunctival pallor. Ears, nose, throat: *** mucous membranes moist. Midline trachea.  Cardiac: *** rate and rhythm.  Respiratory: *** unlabored. Abdominal: *** soft, non-tender, non-distended.  Peripheral vascular: *** Extremity: *** edema. *** cyanosis. *** pallor.  Skin: *** gangrene. *** ulceration.  Lymphatic: *** Stemmer's sign. *** palpable lymphadenopathy.  PERTINENT LABORATORY AND RADIOLOGIC DATA  Most recent CBC CBC Latest Ref Rng & Units 09/27/2013 09/26/2013 09/25/2013  WBC 4.0 - 10.5 K/uL 11.9(H) 13.4(H) -  Hemoglobin 12.0 - 15.0 g/dL 9.1(L) 9.1(L) 10.2(L)  Hematocrit 36.0 - 46.0 % 26.5(L) 26.3(L) 30.0(L)  Platelets 150 - 400 K/uL 120(L) 110(L) -     Most recent CMP CMP Latest Ref Rng & Units 07/08/2019 01/23/2014 09/27/2013  Glucose 70 - 99 mg/dL - - 95  BUN 6 - 23  mg/dL - - 20  Creatinine 0.50 - 1.10 mg/dL - - 0.60  Sodium 137 - 147 mEq/L - - 137  Potassium 3.7 - 5.3 mEq/L - - 4.2  Chloride 96 - 112 mEq/L - - 104  CO2 19 - 32 mEq/L - - 25  Calcium 8.4 - 10.5 mg/dL - - 8.6  Total Protein 6.0 - 8.5 g/dL 6.8 6.7 -  Total Bilirubin 0.0 -  1.2 mg/dL 0.4 0.4 -  Alkaline Phos 39 - 117 IU/L 79 74 -  AST 0 - 40 IU/L 19 19 -  ALT 0 - 32 IU/L 11 15 -    Renal function CrCl cannot be calculated (Patient's most recent lab result is older than the maximum 21 days allowed.).  Hgb A1c MFr Bld (%)  Date Value  09/20/2013 5.4    LDL Chol Calc (NIH)  Date Value Ref Range Status  07/08/2019 54 0 - 99 mg/dL Final     Vascular Imaging: ***  Sheetal Lyall N. Stanford Breed, MD [facility] Phone Number: [phone] 02/22/2021 7:36 AM  Total time spent on preparing this encounter including chart review, data review, collecting history, examining the patient, coordinating care for this {tnhtimebilling:26202}  Portions of this report may have been transcribed using voice recognition software.  Every effort has been made to ensure accuracy; however, inadvertent computerized transcription errors may still be present.

## 2021-02-23 ENCOUNTER — Encounter (HOSPITAL_COMMUNITY): Payer: Medicare HMO

## 2021-02-23 ENCOUNTER — Encounter: Payer: Medicare HMO | Admitting: Vascular Surgery

## 2021-02-24 DIAGNOSIS — R051 Acute cough: Secondary | ICD-10-CM | POA: Diagnosis not present

## 2021-02-24 DIAGNOSIS — J3489 Other specified disorders of nose and nasal sinuses: Secondary | ICD-10-CM | POA: Diagnosis not present

## 2021-02-24 DIAGNOSIS — Z1152 Encounter for screening for COVID-19: Secondary | ICD-10-CM | POA: Diagnosis not present

## 2021-02-24 DIAGNOSIS — J189 Pneumonia, unspecified organism: Secondary | ICD-10-CM | POA: Diagnosis not present

## 2021-02-24 DIAGNOSIS — Z952 Presence of prosthetic heart valve: Secondary | ICD-10-CM | POA: Diagnosis not present

## 2021-02-24 DIAGNOSIS — J029 Acute pharyngitis, unspecified: Secondary | ICD-10-CM | POA: Diagnosis not present

## 2021-03-08 ENCOUNTER — Other Ambulatory Visit: Payer: Self-pay

## 2021-03-30 DIAGNOSIS — Z1231 Encounter for screening mammogram for malignant neoplasm of breast: Secondary | ICD-10-CM | POA: Diagnosis not present

## 2021-04-15 DIAGNOSIS — Z85828 Personal history of other malignant neoplasm of skin: Secondary | ICD-10-CM | POA: Diagnosis not present

## 2021-04-15 DIAGNOSIS — L821 Other seborrheic keratosis: Secondary | ICD-10-CM | POA: Diagnosis not present

## 2021-04-15 DIAGNOSIS — L570 Actinic keratosis: Secondary | ICD-10-CM | POA: Diagnosis not present

## 2021-05-24 ENCOUNTER — Encounter: Payer: Self-pay | Admitting: Pulmonary Disease

## 2021-05-24 ENCOUNTER — Ambulatory Visit (INDEPENDENT_AMBULATORY_CARE_PROVIDER_SITE_OTHER): Payer: Medicare HMO | Admitting: Pulmonary Disease

## 2021-05-24 ENCOUNTER — Other Ambulatory Visit: Payer: Self-pay

## 2021-05-24 VITALS — BP 124/68 | HR 78 | Temp 98.0°F | Ht 63.0 in | Wt 114.0 lb

## 2021-05-24 DIAGNOSIS — R109 Unspecified abdominal pain: Secondary | ICD-10-CM | POA: Diagnosis not present

## 2021-05-24 NOTE — Progress Notes (Signed)
@Patient  ID: Linda Parker, female    DOB: 08-07-1935, 86 y.o.   MRN: [id number]  Chief Complaint  Patient presents with   Consult    Self refferal.In September she haw a dr that noted pleurisy. Leff lung pain when she inhales deeply, coughs and sneezes.     Referring provider: Tisovec, Fransico Him, MD  HPI:   86 y.o. woman whom we are seeing in consultation for evaluation of left-sided chest/flank pain.  Note from PCP reviewed.  Most recent cardiology note 10/2020 reviewed.  Most recent TTE reviewed.  Overall she is doing well.  She denies any respiratory symptoms.  No shortness of breath.  No consistent cough.  She reports about 83-month history of left-sided chest and flank pain.  No flank pain.  Starts about nipple line and radiates posteriorly, occasionally elevated spine.  Exacerbated by cough, sneeze.  Occasional deep inspiration.  Not with positions or movement.  Gradually improving over time in terms of severity.  Seen by PCP many weeks ago and diagnosed with pleurisy.  However this is persisted for months as a close to the few weeks she was told it would.  Work-up includes CTA PE protocol 01/2021 that on my review interpretation shows no PE, clear lungs, no parenchymal source of symptoms.  Do not see any fractured ribs on imaging is well.  She denies any other relieving or exacerbating factors.  Has not taken Tylenol or ibuprofen.  Since its intermittent and somewhat unpredictable, she does not feel the need to take a daily medication for this.  Especially as intensity is gradually improving over time.  PMH: Allergies, arthritis Surgical history: Heart valve and CABG 2015, hysterectomy, appendectomy Family history: Reviewed, no significant respiratory illness of first relatives Social history: Never smoker, lives in [place] with husband  Questionaires / Pulmonary Flowsheets:   ACT:  No flowsheet data found.  MMRC: No flowsheet data found.  Epworth:  No flowsheet data  found.  Tests:   FENO:  No results found for: NITRICOXIDE  PFT: PFT Results Latest Ref Rng & Units 09/20/2013  FVC-Pre L 2.41  FVC-Predicted Pre % 90  Pre FEV1/FVC % % 74  FEV1-Pre L 1.78  FEV1-Predicted Pre % 89  DLCO uncorrected ml/min/mmHg 15.85  DLCO UNC% % 69  DLCO corrected ml/min/mmHg 15.31  DLCO COR %Predicted % 66  DLVA Predicted % 88  TLC L 4.62  TLC % Predicted % 94  RV % Predicted % 87  Personally reviewed and interpreted as normal spirometry, lung volumes within normal limits, DLCO moderately reduced but likely underestimated given volume used to calculate DLCO was less than 85% of TLC recorded  WALK:  No flowsheet data found.  Imaging: Personally reviewed as per EMR discussion this note  Lab Results: Personally reviewed CBC    Component Value Date/Time   WBC 11.9 (H) 09/27/2013 0413   RBC 2.82 (L) 09/27/2013 0413   HGB 9.1 (L) 09/27/2013 0413   HCT 26.5 (L) 09/27/2013 0413   PLT 120 (L) 09/27/2013 0413   MCV 94.0 09/27/2013 0413   MCH 32.3 09/27/2013 0413   MCHC 34.3 09/27/2013 0413   RDW 16.0 (H) 09/27/2013 0413   LYMPHSABS 2.0 09/09/2013 0925   MONOABS 0.8 09/09/2013 0925   EOSABS 0.6 09/09/2013 0925   BASOSABS 0.0 09/09/2013 0925    BMET    Component Value Date/Time   NA 137 09/27/2013 0413   K 4.2 09/27/2013 0413   CL 104 09/27/2013 0413   CO2  25 09/27/2013 0413   GLUCOSE 95 09/27/2013 0413   BUN 20 09/27/2013 0413   CREATININE 0.60 09/27/2013 0413   CALCIUM 8.6 09/27/2013 0413   GFRNONAA 87 (L) 09/27/2013 0413   GFRAA >90 09/27/2013 0413    BNP No results found for: BNP  ProBNP No results found for: PROBNP  Specialty Problems       Pulmonary Problems   DYSPNEA    Qualifier: Diagnosis of  By: Stanford Breed, MD, Kandyce Rud        Allergies  Allergen Reactions   Erythromycin Nausea Only    Feels like she is dying/out of her body   Erythromycin Base Other (See Comments)    Has out of body feeling   Monosodium  Glutamate Other (See Comments)    Migraine headache    Nitrates, Organic     headache   Sulfamethoxazole Other (See Comments)    Makes her feel strange   Sulfonamide Derivatives Nausea Only    Feels like she is dying/out of her body   Ciprofloxacin Palpitations    Feels out of her body experience   Versed [Midazolam] Other (See Comments)    Pt experienced forgetfulness for days after and requests we do not use.    Immunization History  Administered Date(s) Administered   Influenza-Unspecified 03/09/2021   PFIZER(Purple Top)SARS-COV-2 Vaccination 05/15/2019, 06/02/2019    Past Medical History:  Diagnosis Date   Allergy    Aortic stenosis    s/p AVR w/ 23 mm Edward Magna-ease Pericardial Valve   Arthritis    hands & knee- L    Coronary artery disease    Dysrhythmia    when taking antihistamines   Heart murmur    History of echocardiogram    Echo (8/15): EF 55-60%, normal wall motion, grade 1 diastolic dysfunction, AVR okay (mean 10 mm Hg), normal RV function, moderate TR   Osteoporosis    Palpitations    Unspecified hypothyroidism    Vertigo     Tobacco History: Social History   Tobacco Use  Smoking Status Never  Smokeless Tobacco Never   Counseling given: Not Answered   Continue to not smoke  Outpatient Encounter Medications as of 05/24/2021  Medication Sig   acetaminophen (TYLENOL) 325 MG tablet Take 650 mg by mouth every 6 (six) hours as needed.   amoxicillin (AMOXIL) 500 MG tablet Take 4 tablets ( 2 gms ) 1 hour before dental procedure   Ascorbic Acid (VITAMIN C) POWD Take by mouth. Taking 1/4 teaspoon daily   aspirin 81 MG tablet Take 81 mg by mouth daily.   cholecalciferol (VITAMIN D) 1000 UNITS tablet Take 2,000 Units by mouth daily.    CINNAMON PO Take by mouth. Takes 1/4 teaspoon daily   Cyanocobalamin (VITAMIN B-12 PO) Take by mouth as directed.   estradiol (ESTRACE) 0.5 MG tablet Take 0.5 mg by mouth daily before breakfast.    folic acid (FOLVITE)  161 MCG tablet Take 400 mcg by mouth daily.   Glucosamine-Chondroitin (GLUCOSAMINE CHONDR COMPLEX PO) Take 1 tablet by mouth 2 (two) times daily.   levothyroxine (SYNTHROID, LEVOTHROID) 50 MCG tablet Take 50 mcg by mouth daily.    lovastatin (MEVACOR) 20 MG tablet TAKE 1 TABLET (20 MG TOTAL) BY MOUTH AT BEDTIME.   omega-3 acid ethyl esters (LOVAZA) 1 G capsule Take 1 g by mouth 2 (two) times daily.   traMADol (ULTRAM) 50 MG tablet Take 1 tablet (50 mg total) by mouth every 6 (six) hours as needed.  vitamin E 400 UNIT capsule Take 400 Units by mouth daily.   VITAMIN K PO Take by mouth.   [DISCONTINUED] traMADol (ULTRAM) 50 MG tablet Take 1 tablet (50 mg total) by mouth every 6 (six) hours as needed.   No facility-administered encounter medications on file as of 05/24/2021.     Review of Systems  Review of Systems  No chest effusion.  No orthopnea or PND.  No lower extremity swelling.  Conference review of systems otherwise negative. Physical Exam  BP 124/68 (BP Location: Left Arm, Patient Position: Sitting, Cuff Size: Normal)    Pulse 78    Temp 98 F (36.7 C) (Oral)    Ht 5\' 3"  (1.6 m)    Wt 114 lb (51.7 kg)    SpO2 98%    BMI 20.19 kg/m   Wt Readings from Last 5 Encounters:  05/24/21 114 lb (51.7 kg)  11/09/20 118 lb (53.5 kg)  07/14/20 120 lb 5.9 oz (54.6 kg)  07/10/20 122 lb 3.2 oz (55.4 kg)  08/21/19 124 lb 1.9 oz (56.3 kg)    BMI Readings from Last 5 Encounters:  05/24/21 20.19 kg/m  11/09/20 20.90 kg/m  07/14/20 21.32 kg/m  07/10/20 21.65 kg/m  08/21/19 21.99 kg/m     Physical Exam General: Well-appearing, no acute distress Eyes: EOMI, icterus Neck: Supple, no JVP Pulmonary: Clear, left flank pain with deep inspiration, normal work of breathing Cardiovascular: Regular rate and rhythm, no murmurs Abdomen: Nondistended, bowel sounds present MSK: Point tenderness over left lateral/flank area when palpating between ribs, no synovitis on peripheral joints Neuro:  Normal gait, no weakness Psych: Normal mood, full affect   Assessment & Plan:   Left-sided chest pain: Present for several months.  Worse with coughing or sneezing.  Does not feel worse with positions.  CT chest 01/2021 reviewed with no PE, no intraparenchymal cause of her symptoms.  Tender to palpation on exam.  MSK in nature.  Recommend local lidocaine or Salonpas patches for 12 hours on, 12 hours off.  Consider PT referral for core strengthening.   Return if symptoms worsen or fail to improve.   Lanier Clam, MD 05/24/2021

## 2021-05-24 NOTE — Patient Instructions (Addendum)
Nice to meet you!  Use lidocaine patches or salon pas patches for 12 hours a day for the next 2-4 weeks  If they help, consider core exercises or we can send a referral to physical therapy to help with core exercises  Return as needed - call if you think I can help!

## 2021-06-10 ENCOUNTER — Encounter: Payer: Self-pay | Admitting: Cardiology

## 2021-06-10 NOTE — Telephone Encounter (Signed)
error 

## 2021-06-14 ENCOUNTER — Telehealth: Payer: Self-pay | Admitting: Cardiology

## 2021-06-14 DIAGNOSIS — R002 Palpitations: Secondary | ICD-10-CM

## 2021-06-14 NOTE — Telephone Encounter (Signed)
Left message for pt to call.

## 2021-06-14 NOTE — Telephone Encounter (Signed)
Patient c/o Palpitations:  High priority if patient c/o lightheadedness, shortness of breath, or chest pain  How long have you had palpitations/irregular HR/ Afib? Are you having the symptoms now? Heart racing at night  Are you currently experiencing lightheadedness, SOB or CP? no  Do you have a history of afib (atrial fibrillation) or irregular heart rhythm?   Have you checked your BP or HR? (document readings if available): blood pressure is ok;  Are you experiencing any other symptoms? Only at night, think it racing due to a lot of stress- she says she also have a fever blister- wants to know if he will calling  [place] please for the blister

## 2021-06-15 NOTE — Telephone Encounter (Signed)
Spoke with pt, her husband has been diagnosed with dementia and she will wake up around 3 am thinking about all of the things going on and will feel her heart beating fast. She does fine during the day and feels like this is all related to stress. She has taken metoprolol in the past and wants to know if she can restart the metoprolol. She wanted dr Stanford Breed to know there are a lot of things going on with her husband to keep him in the loop. Aware dr Stanford Breed is not in the office today but will forward for his review.

## 2021-06-16 MED ORDER — METOPROLOL TARTRATE 25 MG PO TABS: 12.5000 mg | ORAL_TABLET | Freq: Two times a day (BID) | ORAL | 3 refills | Status: DC

## 2021-06-16 NOTE — Telephone Encounter (Signed)
Spoke with pt, Aware of dr crenshaw's recommendations. New script sent to the pharmacy  

## 2021-07-07 DIAGNOSIS — H40053 Ocular hypertension, bilateral: Secondary | ICD-10-CM | POA: Diagnosis not present

## 2021-07-08 NOTE — Progress Notes (Deleted)
? ? ? ? ?HPI:FU AVR and CAD. Echocardiogram in May 2015 demonstrated severe aortic stenosis. She was symptomatic and referred for cardiac catheterization. This demonstrated 3 vessel CAD. She was referred for CABG and AVR. She was admitted 6/15 and underwent CABG (LIMA-LAD, SVG-D1, SVG-D2, SVG-OM1) and bioprosthetic AVR (23 mm Edwards magna-ease pericardial valve). Note preoperative carotid Dopplers May 2015 showed bilateral 1-39% stenosis. Last echocardiogram August 2022 showed normal LV function, mild left ventricular hypertrophy, grade 1 diastolic dysfunction, biatrial enlargement, mild mitral regurgitation, moderate tricuspid regurgitation, previous aortic valve replacement with normal function and mildly dilated ascending aorta at 39 mm.  CTA October 2022 showed borderline dilatation of the aortic arch at 3.1 cm.  Since last seen,  ? ?Current Outpatient Medications  ?Medication Sig Dispense Refill  ? acetaminophen (TYLENOL) 325 MG tablet Take 650 mg by mouth every 6 (six) hours as needed.    ? amoxicillin (AMOXIL) 500 MG tablet Take 4 tablets ( 2 gms ) 1 hour before dental procedure 4 tablet 3  ? Ascorbic Acid (VITAMIN C) POWD Take by mouth. Taking 1/4 teaspoon daily    ? aspirin 81 MG tablet Take 81 mg by mouth daily.    ? cholecalciferol (VITAMIN D) 1000 UNITS tablet Take 2,000 Units by mouth daily.     ? CINNAMON PO Take by mouth. Takes 1/4 teaspoon daily    ? Cyanocobalamin (VITAMIN B-12 PO) Take by mouth as directed.    ? estradiol (ESTRACE) 0.5 MG tablet Take 0.5 mg by mouth daily before breakfast.     ? folic acid (FOLVITE) 865 MCG tablet Take 400 mcg by mouth daily.    ? Glucosamine-Chondroitin (GLUCOSAMINE CHONDR COMPLEX PO) Take 1 tablet by mouth 2 (two) times daily.    ? levothyroxine (SYNTHROID, LEVOTHROID) 50 MCG tablet Take 50 mcg by mouth daily.     ? lovastatin (MEVACOR) 20 MG tablet TAKE 1 TABLET (20 MG TOTAL) BY MOUTH AT BEDTIME. 90 tablet 3  ? metoprolol tartrate (LOPRESSOR) 25 MG tablet Take  0.5 tablets (12.5 mg total) by mouth 2 (two) times daily. 90 tablet 3  ? omega-3 acid ethyl esters (LOVAZA) 1 G capsule Take 1 g by mouth 2 (two) times daily.    ? traMADol (ULTRAM) 50 MG tablet Take 1 tablet (50 mg total) by mouth every 6 (six) hours as needed. 20 tablet 0  ? vitamin E 400 UNIT capsule Take 400 Units by mouth daily.    ? VITAMIN K PO Take by mouth.    ? ?No current facility-administered medications for this visit.  ? ? ? ?Past Medical History:  ?Diagnosis Date  ? Allergy   ? Aortic stenosis   ? s/p AVR w/ 23 mm Edward Magna-ease Pericardial Valve  ? Arthritis   ? hands & knee- L   ? Coronary artery disease   ? Dysrhythmia   ? when taking antihistamines  ? Heart murmur   ? History of echocardiogram   ? Echo (8/15): EF 55-60%, normal wall motion, grade 1 diastolic dysfunction, AVR okay (mean 10 mm Hg), normal RV function, moderate TR  ? Osteoporosis   ? Palpitations   ? Unspecified hypothyroidism   ? Vertigo   ? ? ?Past Surgical History:  ?Procedure Laterality Date  ? ABDOMINAL HYSTERECTOMY    ? AORTIC VALVE REPLACEMENT N/A 09/24/2013  ? Procedure: AORTIC VALVE REPLACEMENT (AVR) using Pericardial Tissue Valve (size 58m).;  Surgeon: BGaye Pollack MD;  [facility];  Service: Open Heart Surgery;  Laterality: N/A;  ? APPENDECTOMY    ? at time of hysterectomy  ? bilateral knee surgery  35 yrs. ago  ? cartilage removed from knees  ? CARDIAC CATHETERIZATION    ? CARDIAC VALVE REPLACEMENT    ? CORONARY ARTERY BYPASS GRAFT N/A 09/24/2013  ? Procedure: CORONARY ARTERY BYPASS GRAFTING (CABG) x4: LIMA to LAD, SVG to Diagonal 1, SVG to Diagonal 2, Svg to OM 1.;  Surgeon: Gaye Pollack, MD;  Location: [facility];  Service: Open Heart Surgery;  Laterality: N/A;  ? FINGER SURGERY    ? left hand  ? INJECTION KNEE Left 03/04/2013  ? Procedure: left KNEE cortisone INJECTION;  Surgeon: Gearlean Alf, MD;  Location: [facility];  Service: Orthopedics;  Laterality: Left;  ? INTRAOPERATIVE TRANSESOPHAGEAL ECHOCARDIOGRAM N/A  09/24/2013  ? Procedure: INTRAOPERATIVE TRANSESOPHAGEAL ECHOCARDIOGRAM;  Surgeon: Gaye Pollack, MD;  Location: [facility] OR;  Service: Open Heart Surgery;  Laterality: N/A;  ? LEFT AND RIGHT HEART CATHETERIZATION WITH CORONARY ANGIOGRAM N/A 09/17/2013  ? Procedure: LEFT AND RIGHT HEART CATHETERIZATION WITH CORONARY ANGIOGRAM;  Surgeon: Blane Ohara, MD;  Location: [facility] CATH LAB;  Service: Cardiovascular;  Laterality: N/A;  ? MASS EXCISION Right 05/17/2018  ? Procedure: EXCISION CYST RIGHT RING FINGER, DEBRIDEMENT DISTAL INTERPHALANGEAL RIGHT FINGER;  Surgeon: Daryll Brod, MD;  Location: [place];  Service: Orthopedics;  Laterality: Right;  ? MASS EXCISION Right 07/14/2020  ? Procedure: EXCISION CYST, DEBRIDEMENT DISTAL INTERPHALANGEAL JOINT RIGHT INDEX FINGER;  Surgeon: Daryll Brod, MD;  Location: [place];  Service: Orthopedics;  Laterality: Right;  IV REGIONAL FOREARM BLOCK; 60 MINUTES  ? TONSILLECTOMY    ? TOTAL KNEE ARTHROPLASTY Right 03/04/2013  ? Procedure: RIGHT TOTAL KNEE ARTHROPLASTY;  Surgeon: Gearlean Alf, MD;  Location: [facility];  Service: Orthopedics;  Laterality: Right;  ? ? ?Social History  ? ?Socioeconomic History  ? Marital status: Married  ?  Spouse name: Not on file  ? Number of children: Not on file  ? Years of education: Not on file  ? Highest education level: Not on file  ?Occupational History  ? Not on file  ?Tobacco Use  ? Smoking status: Never  ? Smokeless tobacco: Never  ?Vaping Use  ? Vaping Use: Never used  ?Substance and Sexual Activity  ? Alcohol use: Yes  ?  Alcohol/week: 1.0 standard drink  ?  Types: 1 Glasses of wine per week  ?  Comment: 4 oz nightly red wine  ? Drug use: No  ? Sexual activity: Not Currently  ?  Birth control/protection: Post-menopausal, Surgical  ?Other Topics Concern  ? Not on file  ?Social History Narrative  ? Not on file  ? ?Social Determinants of Health  ? ?Financial Resource Strain: Not on file  ?Food Insecurity: Not on file   ?Transportation Needs: Not on file  ?Physical Activity: Not on file  ?Stress: Not on file  ?Social Connections: Not on file  ?Intimate Partner Violence: Not on file  ? ? ?Family History  ?Problem Relation Age of Onset  ? Heart disease Mother   ? Cancer Father   ? ? ?ROS: no fevers or chills, productive cough, hemoptysis, dysphasia, odynophagia, melena, hematochezia, dysuria, hematuria, rash, seizure activity, orthopnea, PND, pedal edema, claudication. Remaining systems are negative. ? ?Physical Exam: ?Well-developed well-nourished in no acute distress.  ?Skin is warm and dry.  ?HEENT is normal.  ?Neck is supple.  ?Chest is clear to auscultation with normal expansion.  ?Cardiovascular exam  is regular rate and rhythm.  ?Abdominal exam nontender or distended. No masses palpated. ?Extremities show no edema. ?neuro grossly intact ? ?ECG- personally reviewed ? ?A/P ? ?1 coronary artery disease-patient denies chest pain.  Continue aspirin and statin. ? ?2 previous aortic valve replacement-most recent echocardiogram showed normally functioning aortic valve.  Continue SBE prophylaxis. ? ?3 hyperlipidemia-continue statin.  She did not tolerate higher doses previously. ? ?4 palpitations-reasonably well controlled.  Can consider addition of beta-blockade in the future if necessary. ? ?5 borderline dilated aortic arch-we will consider followup imaging in the future. ? ?[name], MD ? ? ? ?

## 2021-07-14 ENCOUNTER — Ambulatory Visit: Payer: Medicare HMO | Admitting: Cardiology

## 2021-07-26 DIAGNOSIS — E039 Hypothyroidism, unspecified: Secondary | ICD-10-CM | POA: Diagnosis not present

## 2021-07-26 DIAGNOSIS — R634 Abnormal weight loss: Secondary | ICD-10-CM | POA: Diagnosis not present

## 2021-07-26 DIAGNOSIS — I119 Hypertensive heart disease without heart failure: Secondary | ICD-10-CM | POA: Diagnosis not present

## 2021-07-26 DIAGNOSIS — I25810 Atherosclerosis of coronary artery bypass graft(s) without angina pectoris: Secondary | ICD-10-CM | POA: Diagnosis not present

## 2021-07-26 DIAGNOSIS — Z658 Other specified problems related to psychosocial circumstances: Secondary | ICD-10-CM | POA: Diagnosis not present

## 2021-08-26 NOTE — Progress Notes (Signed)
? ? ? ? ?HPI: FU AVR and CAD. Echocardiogram in May 2015 demonstrated severe aortic stenosis. She was symptomatic and referred for cardiac catheterization. This demonstrated 3 vessel CAD. She was referred for CABG and AVR. She was admitted 6/15 and underwent CABG (LIMA-LAD, SVG-D1, SVG-D2, SVG-OM1) and bioprosthetic AVR (23 mm Edwards magna-ease pericardial valve). Note preoperative carotid Dopplers May 2015 showed bilateral 1-39% stenosis. Echocardiogram August 2022 showed normal LV function, mild left ventricular hypertrophy, grade 1 diastolic dysfunction, biatrial enlargement, mild mitral vegetation, moderate tricuspid regurgitation, previous aortic valve replacement with no aortic insufficiency and mean gradient 10 mmHg.  CTA October 2022 showed coronary and aortic atherosclerosis and borderline dilatation of the aortic arch at 3.1 cm.  Since last seen, she denies dyspnea, chest pain, palpitations or syncope.  She is depressed as her husband has developed sudden onset dementia.  She has lost weight. ? ?Current Outpatient Medications  ?Medication Sig Dispense Refill  ? acetaminophen (TYLENOL) 325 MG tablet Take 650 mg by mouth every 6 (six) hours as needed.    ? Ascorbic Acid (VITAMIN C) POWD Take by mouth. Taking 1/4 teaspoon daily    ? aspirin 81 MG tablet Take 81 mg by mouth daily.    ? cholecalciferol (VITAMIN D) 1000 UNITS tablet Take 2,000 Units by mouth daily.     ? CINNAMON PO Take by mouth. Takes 1/4 teaspoon daily    ? Cyanocobalamin (VITAMIN B-12 PO) Take by mouth as directed.    ? estradiol (ESTRACE) 0.5 MG tablet Take 0.5 mg by mouth daily before breakfast.     ? folic acid (FOLVITE) 476 MCG tablet Take 400 mcg by mouth daily.    ? Glucosamine-Chondroitin (GLUCOSAMINE CHONDR COMPLEX PO) Take 1 tablet by mouth 2 (two) times daily.    ? levothyroxine (SYNTHROID, LEVOTHROID) 50 MCG tablet Take 50 mcg by mouth daily.     ? lovastatin (MEVACOR) 20 MG tablet TAKE 1 TABLET (20 MG TOTAL) BY MOUTH AT  BEDTIME. 90 tablet 3  ? metoprolol tartrate (LOPRESSOR) 25 MG tablet Take 0.5 tablets (12.5 mg total) by mouth 2 (two) times daily. 90 tablet 3  ? omega-3 acid ethyl esters (LOVAZA) 1 G capsule Take 1 g by mouth 2 (two) times daily.    ? sertraline (ZOLOFT) 25 MG tablet Take 25 mg by mouth daily.    ? traMADol (ULTRAM) 50 MG tablet Take 1 tablet (50 mg total) by mouth every 6 (six) hours as needed. 20 tablet 0  ? vitamin E 400 UNIT capsule Take 400 Units by mouth daily.    ? VITAMIN K PO Take by mouth.    ? amoxicillin (AMOXIL) 500 MG tablet Take 4 tablets ( 2 gms ) 1 hour before dental procedure 4 tablet 3  ? ?No current facility-administered medications for this visit.  ? ? ? ?Past Medical History:  ?Diagnosis Date  ? Allergy   ? Aortic stenosis   ? s/p AVR w/ 23 mm Edward Magna-ease Pericardial Valve  ? Arthritis   ? hands & knee- L   ? Coronary artery disease   ? Dysrhythmia   ? when taking antihistamines  ? Heart murmur   ? History of echocardiogram   ? Echo (8/15): EF 55-60%, normal wall motion, grade 1 diastolic dysfunction, AVR okay (mean 10 mm Hg), normal RV function, moderate TR  ? Osteoporosis   ? Palpitations   ? Unspecified hypothyroidism   ? Vertigo   ? ? ?Past Surgical History:  ?Procedure Laterality Date  ?  ABDOMINAL HYSTERECTOMY    ? AORTIC VALVE REPLACEMENT N/A 09/24/2013  ? Procedure: AORTIC VALVE REPLACEMENT (AVR) using Pericardial Tissue Valve (size 75m).;  Surgeon: BGaye Pollack MD;  [facility];  Service: Open Heart Surgery;  Laterality: N/A;  ? APPENDECTOMY    ? at time of hysterectomy  ? bilateral knee surgery  35 yrs. ago  ? cartilage removed from knees  ? CARDIAC CATHETERIZATION    ? CARDIAC VALVE REPLACEMENT    ? CORONARY ARTERY BYPASS GRAFT N/A 09/24/2013  ? Procedure: CORONARY ARTERY BYPASS GRAFTING (CABG) x4: LIMA to LAD, SVG to Diagonal 1, SVG to Diagonal 2, Svg to OM 1.;  Surgeon: BGaye Pollack MD;  Location: [facility];  Service: Open Heart Surgery;  Laterality: N/A;  ? FINGER  SURGERY    ? left hand  ? INJECTION KNEE Left 03/04/2013  ? Procedure: left KNEE cortisone INJECTION;  Surgeon: FGearlean Alf MD;  [facility];  Service: Orthopedics;  Laterality: Left;  ? INTRAOPERATIVE TRANSESOPHAGEAL ECHOCARDIOGRAM N/A 09/24/2013  ? Procedure: INTRAOPERATIVE TRANSESOPHAGEAL ECHOCARDIOGRAM;  Surgeon: BGaye Pollack MD;  [facility];  Service: Open [facility] Surgery;  Laterality: N/A;  ? LEFT AND RIGHT HEART CATHETERIZATION WITH CORONARY ANGIOGRAM N/A 09/17/2013  ? Procedure: LEFT AND RIGHT HEART CATHETERIZATION WITH CORONARY ANGIOGRAM;  Surgeon: MBlane Ohara MD;  [facility];  Service: Cardiovascular;  Laterality: N/A;  ? MASS EXCISION Right 05/17/2018  ? Procedure: EXCISION CYST RIGHT RING FINGER, DEBRIDEMENT DISTAL INTERPHALANGEAL RIGHT FINGER;  Surgeon: KDaryll Brod MD;  [facility]  Service: Orthopedics;  Laterality: Right;  ? MASS EXCISION Right 07/14/2020  ? Procedure: EXCISION CYST, DEBRIDEMENT DISTAL INTERPHALANGEAL JOINT RIGHT INDEX FINGER;  Surgeon: KDaryll Brod MD;  [facility]  Service: Orthopedics;  Laterality: Right;  IV REGIONAL FOREARM BLOCK; 60 MINUTES  ? TONSILLECTOMY    ? TOTAL KNEE ARTHROPLASTY Right 03/04/2013  ? Procedure: RIGHT TOTAL KNEE ARTHROPLASTY;  Surgeon: FGearlean Alf MD;  [facility];  Service: Orthopedics;  Laterality: Right;  ? ? ?Social History  ? ?Socioeconomic History  ? Marital status: Married  ?  Spouse name: Not on file  ? Number of children: Not on file  ? Years of education: Not on file  ? Highest education level: Not on file  ?Occupational History  ? Not on file  ?Tobacco Use  ? Smoking status: Never  ? Smokeless tobacco: Never  ?Vaping Use  ? Vaping Use: Never used  ?Substance and Sexual Activity  ? Alcohol use: Yes  ?  Alcohol/week: 1.0 standard drink  ?  Types: 1 Glasses of wine per week  ?  Comment: 4 oz nightly red wine  ? Drug use: No  ? Sexual activity: Not Currently  ?   Birth control/protection: Post-menopausal, Surgical  ?Other Topics Concern  ? Not on file  ?Social History Narrative  ? Not on file  ? ?Social Determinants of Health  ? ?Financial Resource Strain: Not on file  ?Food Insecurity: Not on file  ?Transportation Needs: Not on file  ?Physical Activity: Not on file  ?Stress: Not on file  ?Social Connections: Not on file  ?Intimate Partner Violence: Not on file  ? ? ?Family History  ?Problem Relation Age of Onset  ? Heart disease Mother   ? Cancer Father   ? ? ?ROS: Weight loss of 18 pounds and fatigue but no fevers or chills, productive cough, hemoptysis, dysphasia, odynophagia, melena, hematochezia, dysuria,  hematuria, rash, seizure activity, orthopnea, PND, pedal edema, claudication. Remaining systems are negative. ? ?Physical Exam: ?Well-developed well-nourished in no acute distress.  ?Skin is warm and dry.  ?HEENT is normal.  ?Neck is supple.  ?Chest is clear to auscultation with normal expansion.  ?Cardiovascular exam is regular rate and rhythm.  2/6 systolic murmur left sternal border. ?Abdominal exam nontender or distended. No masses palpated. ?Extremities show no edema. ?neuro grossly intact ? ?ECG-normal sinus rhythm, left axis deviation, cannot rule out septal infarct.  Personally reviewed ? ?A/P ? ?1 coronary artery disease status post coronary artery bypass graft-patient is not having chest pain.  Continue aspirin and statin. ? ?2 status post aortic valve replacement-continue SBE prophylaxis.  Most recent echocardiogram showed normally functioning valve. ? ?3 palpitations-she is not having significant symptoms.  We will consider beta-blockade in the future if necessary. ? ?4 hyperlipidemia-continue statin.  As outlined in previous notes that she has not tolerated higher doses in the past. ? ?[name], MD ? ? ? ?

## 2021-08-27 DIAGNOSIS — Z658 Other specified problems related to psychosocial circumstances: Secondary | ICD-10-CM | POA: Diagnosis not present

## 2021-08-27 DIAGNOSIS — I25810 Atherosclerosis of coronary artery bypass graft(s) without angina pectoris: Secondary | ICD-10-CM | POA: Diagnosis not present

## 2021-08-27 DIAGNOSIS — R634 Abnormal weight loss: Secondary | ICD-10-CM | POA: Diagnosis not present

## 2021-08-27 DIAGNOSIS — I119 Hypertensive heart disease without heart failure: Secondary | ICD-10-CM | POA: Diagnosis not present

## 2021-08-27 DIAGNOSIS — E039 Hypothyroidism, unspecified: Secondary | ICD-10-CM | POA: Diagnosis not present

## 2021-08-27 DIAGNOSIS — R252 Cramp and spasm: Secondary | ICD-10-CM | POA: Diagnosis not present

## 2021-08-30 ENCOUNTER — Telehealth: Payer: Self-pay | Admitting: Cardiology

## 2021-08-30 ENCOUNTER — Other Ambulatory Visit: Payer: Self-pay

## 2021-08-30 DIAGNOSIS — I2510 Atherosclerotic heart disease of native coronary artery without angina pectoris: Secondary | ICD-10-CM

## 2021-08-30 DIAGNOSIS — E7800 Pure hypercholesterolemia, unspecified: Secondary | ICD-10-CM

## 2021-08-30 NOTE — Telephone Encounter (Signed)
Patient calling to see if she needs lab work prior to her appt on 5/17. She stated she got paperwork for her husband and not for her. Please advise  ?

## 2021-08-30 NOTE — Telephone Encounter (Signed)
Spoke to patient she wanted to know if she needed lab before her appointment next week with Dr.Crenshaw.After reviewing chart advised she does need a fasting lipid panel and cmet.Orders placed.Advised she can have done this week. ?

## 2021-09-01 DIAGNOSIS — I2583 Coronary atherosclerosis due to lipid rich plaque: Secondary | ICD-10-CM | POA: Diagnosis not present

## 2021-09-01 DIAGNOSIS — E7800 Pure hypercholesterolemia, unspecified: Secondary | ICD-10-CM | POA: Diagnosis not present

## 2021-09-01 DIAGNOSIS — I2510 Atherosclerotic heart disease of native coronary artery without angina pectoris: Secondary | ICD-10-CM | POA: Diagnosis not present

## 2021-09-01 LAB — LIPID PANEL
Chol/HDL Ratio: 2.2 ratio (ref 0.0–4.4)
Cholesterol, Total: 188 mg/dL (ref 100–199)
HDL: 84 mg/dL (ref >39)
LDL Chol Calc (NIH): 91 mg/dL (ref 0–99)
Triglycerides: 71 mg/dL (ref 0–149)
VLDL Cholesterol Cal: 13 mg/dL (ref 5–40)

## 2021-09-03 ENCOUNTER — Encounter: Payer: Self-pay | Admitting: *Deleted

## 2021-09-08 ENCOUNTER — Encounter: Payer: Self-pay | Admitting: Cardiology

## 2021-09-08 ENCOUNTER — Ambulatory Visit: Payer: Medicare HMO | Admitting: Cardiology

## 2021-09-08 VITALS — BP 128/70 | HR 87 | Ht 63.0 in | Wt 108.0 lb

## 2021-09-08 DIAGNOSIS — R002 Palpitations: Secondary | ICD-10-CM | POA: Diagnosis not present

## 2021-09-08 DIAGNOSIS — E7800 Pure hypercholesterolemia, unspecified: Secondary | ICD-10-CM

## 2021-09-08 DIAGNOSIS — I2510 Atherosclerotic heart disease of native coronary artery without angina pectoris: Secondary | ICD-10-CM

## 2021-09-08 DIAGNOSIS — I2583 Coronary atherosclerosis due to lipid rich plaque: Secondary | ICD-10-CM | POA: Diagnosis not present

## 2021-09-08 DIAGNOSIS — Z953 Presence of xenogenic heart valve: Secondary | ICD-10-CM | POA: Diagnosis not present

## 2021-09-08 NOTE — Patient Instructions (Signed)

## 2021-09-10 ENCOUNTER — Telehealth: Payer: Self-pay | Admitting: Cardiology

## 2021-09-10 DIAGNOSIS — R002 Palpitations: Secondary | ICD-10-CM

## 2021-09-10 NOTE — Telephone Encounter (Signed)
*  STAT* If patient is at the pharmacy, call can be transferred to refill team.   1. Which medications need to be refilled? (please list name of each medication and dose if known) metoprolol tartrate (LOPRESSOR) 25 MG tablet  2. Which pharmacy/location (including street and city if local pharmacy) is medication to be sent to? [facility] [place]  3. Do they need a 30 day or 90 day supply? 90 day

## 2021-09-10 NOTE — Telephone Encounter (Signed)
Patient notified copy of her labs were mailed  Cristopher Estimable, RN  09/03/2021  1:41 PM EDT Back to Top    Letter of results sent to pt

## 2021-09-10 NOTE — Telephone Encounter (Signed)
Patient wants a paper copy of her lab work.

## 2021-09-15 MED ORDER — METOPROLOL TARTRATE 25 MG PO TABS: 12.5000 mg | ORAL_TABLET | Freq: Two times a day (BID) | ORAL | 3 refills | Status: DC

## 2021-10-25 DIAGNOSIS — E039 Hypothyroidism, unspecified: Secondary | ICD-10-CM | POA: Diagnosis not present

## 2021-10-25 DIAGNOSIS — R5383 Other fatigue: Secondary | ICD-10-CM | POA: Diagnosis not present

## 2021-10-25 DIAGNOSIS — R634 Abnormal weight loss: Secondary | ICD-10-CM | POA: Diagnosis not present

## 2021-10-25 DIAGNOSIS — E538 Deficiency of other specified B group vitamins: Secondary | ICD-10-CM | POA: Diagnosis not present

## 2021-10-25 DIAGNOSIS — Z658 Other specified problems related to psychosocial circumstances: Secondary | ICD-10-CM | POA: Diagnosis not present

## 2021-10-25 DIAGNOSIS — I25810 Atherosclerosis of coronary artery bypass graft(s) without angina pectoris: Secondary | ICD-10-CM | POA: Diagnosis not present

## 2021-10-25 DIAGNOSIS — I119 Hypertensive heart disease without heart failure: Secondary | ICD-10-CM | POA: Diagnosis not present

## 2021-10-25 DIAGNOSIS — R252 Cramp and spasm: Secondary | ICD-10-CM | POA: Diagnosis not present

## 2021-11-18 DIAGNOSIS — S40022A Contusion of left upper arm, initial encounter: Secondary | ICD-10-CM | POA: Diagnosis not present

## 2021-11-18 DIAGNOSIS — K589 Irritable bowel syndrome without diarrhea: Secondary | ICD-10-CM | POA: Diagnosis not present

## 2021-11-18 DIAGNOSIS — R634 Abnormal weight loss: Secondary | ICD-10-CM | POA: Diagnosis not present

## 2021-11-18 DIAGNOSIS — R252 Cramp and spasm: Secondary | ICD-10-CM | POA: Diagnosis not present

## 2021-11-18 DIAGNOSIS — Z658 Other specified problems related to psychosocial circumstances: Secondary | ICD-10-CM | POA: Diagnosis not present

## 2021-12-07 ENCOUNTER — Telehealth: Payer: Self-pay | Admitting: Cardiology

## 2021-12-07 DIAGNOSIS — R002 Palpitations: Secondary | ICD-10-CM

## 2021-12-07 MED ORDER — METOPROLOL TARTRATE 25 MG PO TABS
12.5000 mg | ORAL_TABLET | Freq: Two times a day (BID) | ORAL | 3 refills | Status: DC
Start: 2021-12-07 — End: 2022-09-23

## 2021-12-07 NOTE — Telephone Encounter (Signed)
Pt wanting to make nurse aware that [facility] will be faxing over paperwork regarding medication Metoprolol. Please advise

## 2021-12-07 NOTE — Telephone Encounter (Signed)
Refilled metoprolol 25 mg #90, sig: 1/2 tablet (12.5 mg ) bid, RF:3   Patient aware.

## 2021-12-08 DIAGNOSIS — R634 Abnormal weight loss: Secondary | ICD-10-CM | POA: Diagnosis not present

## 2021-12-08 DIAGNOSIS — R197 Diarrhea, unspecified: Secondary | ICD-10-CM | POA: Diagnosis not present

## 2021-12-08 DIAGNOSIS — E039 Hypothyroidism, unspecified: Secondary | ICD-10-CM | POA: Diagnosis not present

## 2021-12-08 DIAGNOSIS — K589 Irritable bowel syndrome without diarrhea: Secondary | ICD-10-CM | POA: Diagnosis not present

## 2021-12-08 DIAGNOSIS — Z658 Other specified problems related to psychosocial circumstances: Secondary | ICD-10-CM | POA: Diagnosis not present

## 2021-12-09 DIAGNOSIS — R197 Diarrhea, unspecified: Secondary | ICD-10-CM | POA: Diagnosis not present

## 2021-12-13 ENCOUNTER — Encounter: Payer: Self-pay | Admitting: Gastroenterology

## 2021-12-16 ENCOUNTER — Telehealth: Payer: Self-pay | Admitting: Gastroenterology

## 2021-12-16 NOTE — Telephone Encounter (Signed)
Patient states she is unable to keep any food down. After eating she immediately has diarrhea. She is afraid she isn't getting any nutrients from her food. Please call patient and advise, thank you.

## 2021-12-16 NOTE — Telephone Encounter (Signed)
Pt was concerned that appointment was 01/19/22 and wanted a sooner appointment. Pt was referred from PCP office for diarrhea and weight loss and pt states she had several stool tests and blood tests. Pt states she has 5 bms per day and diarrhea started about 15 days ago. Pt also reports she has lost 16 pounds over the last 2 months. Pt states she has been drinking pedialyte and trying to increase fluid intake to prevent dehydration but pt reports that every time she eats she has diarrhea. Pt denies recent antibiotic use and states diarrhea started when she tried taking IB guard. Pt scheduled for sooner appointment with [name] on 01/04/22 at 1:30 pm. Pt verbalized understanding and had no other concerns at end of call.

## 2021-12-17 NOTE — Telephone Encounter (Signed)
Pt called to get a sooner appointment because pt states she saw blood in her stool this morning. Advised pt go to an urgent care or ER today to be evaluated. Pt verbalized understanding and had no other concerns at end of call.

## 2021-12-18 ENCOUNTER — Encounter (HOSPITAL_BASED_OUTPATIENT_CLINIC_OR_DEPARTMENT_OTHER): Payer: Self-pay

## 2021-12-18 ENCOUNTER — Ambulatory Visit (HOSPITAL_COMMUNITY): Payer: Medicare HMO

## 2021-12-18 ENCOUNTER — Emergency Department (HOSPITAL_BASED_OUTPATIENT_CLINIC_OR_DEPARTMENT_OTHER)
Admission: EM | Admit: 2021-12-18 | Discharge: 2021-12-18 | Disposition: A | Discharge status: Home / Self Care | Payer: Medicare HMO | Attending: Emergency Medicine | Admitting: Emergency Medicine

## 2021-12-18 DIAGNOSIS — Z7982 Long term (current) use of aspirin: Secondary | ICD-10-CM | POA: Diagnosis not present

## 2021-12-18 DIAGNOSIS — R197 Diarrhea, unspecified: Secondary | ICD-10-CM | POA: Diagnosis not present

## 2021-12-18 LAB — CBC WITH DIFFERENTIAL/PLATELET
Abs Immature Granulocytes: 0.03 10*3/uL (ref 0.00–0.07)
Basophils Absolute: 0 10*3/uL (ref 0.0–0.1)
Basophils Relative: 0 %
Eosinophils Absolute: 0 10*3/uL (ref 0.0–0.5)
Eosinophils Relative: 0 %
HCT: 36.8 % (ref 36.0–46.0)
Hemoglobin: 12.7 g/dL (ref 12.0–15.0)
Immature Granulocytes: 0 %
Lymphocytes Relative: 14 %
Lymphs Abs: 1.2 10*3/uL (ref 0.7–4.0)
MCH: 33.9 pg (ref 26.0–34.0)
MCHC: 34.5 g/dL (ref 30.0–36.0)
MCV: 98.1 fL (ref 80.0–100.0)
Monocytes Absolute: 1.2 10*3/uL — ABNORMAL HIGH (ref 0.1–1.0)
Monocytes Relative: 14 %
Neutro Abs: 6.1 10*3/uL (ref 1.7–7.7)
Neutrophils Relative %: 72 %
Platelets: 287 10*3/uL (ref 150–400)
RBC: 3.75 MIL/uL — ABNORMAL LOW (ref 3.87–5.11)
RDW: 12.8 % (ref 11.5–15.5)
WBC: 8.5 10*3/uL (ref 4.0–10.5)
nRBC: 0 % (ref 0.0–0.2)

## 2021-12-18 LAB — C DIFFICILE QUICK SCREEN W PCR REFLEX
C Diff antigen: NEGATIVE
C Diff interpretation: NOT DETECTED
C Diff toxin: NEGATIVE

## 2021-12-18 LAB — URINALYSIS, ROUTINE W REFLEX MICROSCOPIC
Bilirubin Urine: NEGATIVE
Glucose, UA: NEGATIVE mg/dL
Hgb urine dipstick: NEGATIVE
Ketones, ur: NEGATIVE mg/dL
Leukocytes,Ua: NEGATIVE
Nitrite: NEGATIVE
Protein, ur: NEGATIVE mg/dL
Specific Gravity, Urine: <1.005 — ABNORMAL LOW (ref 1.005–1.030)
pH: 6 (ref 5.0–8.0)

## 2021-12-18 LAB — COMPREHENSIVE METABOLIC PANEL
ALT: 14 U/L (ref 0–44)
AST: 15 U/L (ref 15–41)
Albumin: 4.1 g/dL (ref 3.5–5.0)
Alkaline Phosphatase: 69 U/L (ref 38–126)
Anion gap: 11 (ref 5–15)
BUN: 18 mg/dL (ref 8–23)
CO2: 24 mmol/L (ref 22–32)
Calcium: 9 mg/dL (ref 8.9–10.3)
Chloride: 100 mmol/L (ref 98–111)
Creatinine, Ser: 0.61 mg/dL (ref 0.44–1.00)
GFR, Estimated: >60 mL/min (ref >60)
Glucose, Bld: 98 mg/dL (ref 70–99)
Potassium: 3.9 mmol/L (ref 3.5–5.1)
Sodium: 135 mmol/L (ref 135–145)
Total Bilirubin: 0.4 mg/dL (ref 0.3–1.2)
Total Protein: 6.8 g/dL (ref 6.5–8.1)

## 2021-12-18 LAB — LIPASE, BLOOD: Lipase: 14 U/L (ref 11–51)

## 2021-12-18 LAB — OCCULT BLOOD X 1 CARD TO LAB, STOOL: Fecal Occult Bld: NEGATIVE

## 2021-12-18 NOTE — ED Notes (Signed)
Dc instructions reviewed with patient. Patient voiced understanding. Dc with belongings.  °

## 2021-12-18 NOTE — ED Notes (Signed)
Bsc placed at bedside for stool sample.

## 2021-12-18 NOTE — ED Triage Notes (Signed)
She c/o ~ 18 days of several "explosive watery" diarrhea tools/day. This coincided with her beginning a probiotic. She tells me that she had seen "a little blood in my stool" a couple of days ago, but now states "That has cleared up".

## 2021-12-18 NOTE — Discharge Instructions (Signed)
Today you were seen in the emergency department for your diarrhea.    In the emergency department you had lab work that was reassuring.  There was no blood in your stool.  Your stool studies were still pending which will show if there is an infection in your stool.  At home, please continue stay well-hydrated.  You may take Imodium as prescribed for diarrhea.    Check your MyChart online for the results of any tests that had not resulted by the time you left the emergency department.   Follow-up with your primary doctor in 2-3 days regarding your visit and GI.  Return immediately to the emergency department if you experience any of the following: Abdominal pain, dizziness or fainting, or any other concerning symptoms.    Thank you for visiting our Emergency Department. It was a pleasure taking care of you today.

## 2021-12-18 NOTE — ED Provider Notes (Signed)
[place] EMERGENCY DEPT Provider Note   CSN: [id number] Arrival date & time: 12/18/21  1113     History  Chief Complaint  Patient presents with   Diarrhea    Linda Parker is a 86 y.o. female.  86 year old female presents emergency department with diarrhea.  She stated that 18 days ago she was placed on magnesium and probiotics for abdominal pains.  Says that afterwards she started having profuse diarrhea that has not relented.  Says that she typically has 4 or more loose stools per day that are watery.  Denies any persistent abdominal pain.  Does say that she visits the nursing home frequently but is unsure of any C. difficile outbreaks there.  No recent antibiotics or travel.  Also has tried Imodium and Pepto-Bismol this week and noticed that her stools were dark but denies any bright red blood per rectum.  Says that she has tried setting up an appointment with GI but they were unable to see her so she was referred to urgent care who told her to come to the emergency department prior to being seen since they are concerned about possible dehydration.  Denies any fevers, dysuria or frequency, abdominal pain at this time.  Has been drinking Pedialyte and fluids to stay hydrated.   Diarrhea      Home Medications Prior to Admission medications   Medication Sig Start Date End Date Taking? Authorizing Provider  acetaminophen (TYLENOL) 325 MG tablet Take 650 mg by mouth every 6 (six) hours as needed.    [provider]  amoxicillin (AMOXIL) 500 MG tablet Take 4 tablets ( 2 gms ) 1 hour before dental procedure 02/19/21   Lelon Perla, MD  Ascorbic Acid (VITAMIN C) POWD Take by mouth. Taking 1/4 teaspoon daily    [provider]  aspirin 81 MG tablet Take 81 mg by mouth daily.    [provider]  cholecalciferol (VITAMIN D) 1000 UNITS tablet Take 2,000 Units by mouth daily.     [provider]  CINNAMON PO Take by mouth. Takes 1/4  teaspoon daily    [provider]  Cyanocobalamin (VITAMIN B-12 PO) Take by mouth as directed.    [provider]  estradiol (ESTRACE) 0.5 MG tablet Take 0.5 mg by mouth daily before breakfast.     [provider]  folic acid (FOLVITE) 185 MCG tablet Take 400 mcg by mouth daily.    [provider]  Glucosamine-Chondroitin (GLUCOSAMINE CHONDR COMPLEX PO) Take 1 tablet by mouth 2 (two) times daily.    [provider]  levothyroxine (SYNTHROID, LEVOTHROID) 50 MCG tablet Take 50 mcg by mouth daily.     [provider]  lovastatin (MEVACOR) 20 MG tablet TAKE 1 TABLET (20 MG TOTAL) BY MOUTH AT BEDTIME. 11/30/20   Crenshaw, Denice Bors, MD  metoprolol tartrate (LOPRESSOR) 25 MG tablet Take 0.5 tablets (12.5 mg total) by mouth 2 (two) times daily. 12/07/21   Lelon Perla, MD  omega-3 acid ethyl esters (LOVAZA) 1 G capsule Take 1 g by mouth 2 (two) times daily.    [provider]  sertraline (ZOLOFT) 25 MG tablet Take 25 mg by mouth daily.    [provider]  traMADol (ULTRAM) 50 MG tablet Take 1 tablet (50 mg total) by mouth every 6 (six) hours as needed. 07/14/20   Daryll Brod, MD  vitamin E 400 UNIT capsule Take 400 Units by mouth daily.    [provider]  VITAMIN  K PO Take by mouth.    [provider]      Allergies    Erythromycin; Erythromycin base; Monosodium glutamate; Nitrates, organic; Sulfamethoxazole; Sulfonamide derivatives; Ciprofloxacin; and Versed [midazolam]    Review of Systems   Review of Systems  Gastrointestinal:  Positive for diarrhea.    Physical Exam Updated Vital Signs BP 125/71   Pulse 80   Temp 98.3 F (36.8 C) (Oral)   Resp 20   SpO2 100%  Physical Exam Vitals and nursing note reviewed.  Constitutional:      General: She is not in acute distress.    Appearance: She is well-developed.  HENT:     Head: Normocephalic and atraumatic.     Right Ear: External ear normal.      Left Ear: External ear normal.     Nose: Nose normal.     Mouth/Throat:     Mouth: Mucous membranes are moist.     Pharynx: Oropharynx is clear.  Eyes:     Extraocular Movements: Extraocular movements intact.     Conjunctiva/sclera: Conjunctivae normal.     Pupils: Pupils are equal, round, and reactive to light.  Cardiovascular:     Rate and Rhythm: Normal rate.     Heart sounds: No murmur heard. Pulmonary:     Effort: Pulmonary effort is normal. No respiratory distress.  Abdominal:     General: Abdomen is flat. There is no distension.     Palpations: Abdomen is soft. There is no mass.     Tenderness: There is no abdominal tenderness. There is no guarding.  Musculoskeletal:        General: No swelling.     Cervical back: Normal range of motion and neck supple.     Right lower leg: No edema.     Left lower leg: No edema.  Skin:    General: Skin is warm and dry.     Capillary Refill: Capillary refill takes 2 to 3 seconds.  Neurological:     Mental Status: She is alert and oriented to person, place, and time. Mental status is at baseline.  Psychiatric:        Mood and Affect: Mood normal.     ED Results / Procedures / Treatments   Labs (all labs ordered are listed, but only abnormal results are displayed) Labs Reviewed  CBC WITH DIFFERENTIAL/PLATELET - Abnormal; Notable for the following components:      Result Value   RBC 3.75 (*)    Monocytes Absolute 1.2 (*)    All other components within normal limits  URINALYSIS, ROUTINE W REFLEX MICROSCOPIC - Abnormal; Notable for the following components:   Color, Urine COLORLESS (*)    Specific Gravity, Urine <1.005 (*)    All other components within normal limits  C DIFFICILE QUICK SCREEN W PCR REFLEX    GASTROINTESTINAL PANEL BY PCR, STOOL (REPLACES STOOL CULTURE)  COMPREHENSIVE METABOLIC PANEL  LIPASE, BLOOD  OCCULT BLOOD X 1 CARD TO LAB, STOOL    EKG None  Radiology No results found.  Procedures Procedures    Medications Ordered in ED Medications - No data to display  ED Course/ Medical Decision Making/ A&P                           Medical Decision Making Amount and/or Complexity of Data Reviewed Labs: ordered.   86 year old female presents emergency department with diarrhea.  Initial Ddx:  Viral enteritis, C. difficile, bacterial  diarrhea, GI bleed  MDM:  Unclear the exact cause of the patient's diarrhea but given the acute onset feel that it is likely infectious.  Only risk factor for C. difficile is frequently visiting nursing home where her husband lives.  Also considered GI bleed given the dark stools and diarrhea but patient is overall well-appearing and not having symptoms of anemia.  Additionally, for stool could be explained by her use of Pepto-Bismol.  Plan:  Labs Electrolytes GI pathogen panel C. difficile Hemoccult  ED Summary:  Patient underwent the above work-up which did not show any electrolyte abnormalities or acute kidney injury.  Her Hemoccult was negative indicating that her dark stools likely from her Pepto-Bismol use.  C. difficile and GI pathogen panel pending at this time.  Patient was informed to check her MyChart for the results of her test and follow-up with her primary doctor in several days regarding her symptoms.  Dispo: DC Home. Return precautions discussed including, but not limited to, those listed in the AVS. Allowed pt time to ask questions which were answered fully prior to dc.  Records reviewed OP Notes The following labs were independently interpreted: Chemistry and Urinalysis   Final Clinical Impression(s) / ED Diagnoses Final diagnoses:  Diarrhea, unspecified type    Rx / DC Orders ED Discharge Orders     None         Fransico Meadow, MD 12/18/21 2246

## 2021-12-19 LAB — GASTROINTESTINAL PANEL BY PCR, STOOL (REPLACES STOOL CULTURE)

## 2021-12-20 NOTE — Telephone Encounter (Signed)
Pt scheduled for sooner appointment with Dr. Bryan Lemma on 12/28/21 at 2:40 pm.

## 2021-12-20 NOTE — Telephone Encounter (Signed)
Patient called stating she went to the ER on Friday.  They ran blood and stool tests.  She said she is negative for c.diff. and her liver and kidneys were good.  She is still doing the pedialyte but has now had diarrhea for the last 19 days and has lost from 118 to 102.  She just wanted you to be aware that she had gone to the ER.

## 2021-12-20 NOTE — Telephone Encounter (Signed)
Appt with Dr. Bryan Lemma rescheduled to 8/31 at 11:20 am.

## 2021-12-23 ENCOUNTER — Encounter: Payer: Self-pay | Admitting: Gastroenterology

## 2021-12-23 ENCOUNTER — Other Ambulatory Visit (INDEPENDENT_AMBULATORY_CARE_PROVIDER_SITE_OTHER): Payer: Medicare HMO

## 2021-12-23 ENCOUNTER — Telehealth: Payer: Self-pay

## 2021-12-23 ENCOUNTER — Ambulatory Visit: Payer: Medicare HMO | Admitting: Gastroenterology

## 2021-12-23 VITALS — BP 130/60 | HR 84 | Ht 61.5 in | Wt 104.4 lb

## 2021-12-23 DIAGNOSIS — R109 Unspecified abdominal pain: Secondary | ICD-10-CM | POA: Diagnosis not present

## 2021-12-23 DIAGNOSIS — R197 Diarrhea, unspecified: Secondary | ICD-10-CM

## 2021-12-23 DIAGNOSIS — R634 Abnormal weight loss: Secondary | ICD-10-CM | POA: Diagnosis not present

## 2021-12-23 LAB — TSH: TSH: 1.95 u[IU]/mL (ref 0.35–5.50)

## 2021-12-23 LAB — SEDIMENTATION RATE: Sed Rate: 10 mm/hr (ref 0–30)

## 2021-12-23 LAB — C-REACTIVE PROTEIN: CRP: <1 mg/dL (ref 0.5–20.0)

## 2021-12-23 MED ORDER — DIPHENOXYLATE-ATROPINE 2.5-0.025 MG PO TABS: 1.0000 | ORAL_TABLET | Freq: Four times a day (QID) | ORAL | 1 refills | Status: DC | PRN

## 2021-12-23 NOTE — Patient Instructions (Addendum)
If you are age 84 or older, your body mass index should be between 23-30. Your Body mass index is 19.4 kg/m. If this is out of the aforementioned range listed, please consider follow up with your Primary Care Provider.  If you are age 3 or younger, your body mass index should be between 19-25. Your Body mass index is 19.4 kg/m. If this is out of the aformentioned range listed, please consider follow up with your Primary Care Provider.   __________________________________________________________  The [facility] providers would like to encourage you to use [facility] to communicate with providers for non-urgent requests or questions.  Due to long hold times on the telephone, sending your provider a message by [facility] may be a faster and more efficient way to get a response.  Please allow 48 business hours for a response.  Please remember that this is for non-urgent requests.   Due to recent changes in healthcare laws, you may see the results of your imaging and laboratory studies on MyChart before your provider has had a chance to review them.  We understand that in some cases there may be results that are confusing or concerning to you. Not all laboratory results come back in the same time frame and the provider may be waiting for multiple results in order to interpret others.  Please give [place] 48 hours in order for your provider to thoroughly review all the results before contacting the office for clarification of your results.   Your provider has requested that you go to the basement level for lab work before leaving today. Press "B" on the elevator. The lab is located at the first door on the left as you exit the elevator.  You have been scheduled for an endoscopy and colonoscopy. Please follow the written instructions given to you at your visit today. Please pick up your prep supplies at the pharmacy within the next 1-3 days. If you use inhalers (even only as needed), please bring them with you on the  day of your procedure.   We have given you samples of the following medication to take: Clenpiq  We have sent the following medications to your pharmacy for you to pick up at your convenience: Lomotil   Thank you for choosing me and [facility].  Vito Cirigliano, D.O.

## 2021-12-23 NOTE — Progress Notes (Signed)
Chief Complaint: Diarrhea, weight loss   Referring Provider:     Haywood Pao, MD   HPI:     Linda Parker is a 86 y.o. female with a history of CAD s/p CABG 2015, severe aortic stenosis s/p bioprosthetic AVR 2015, HLD, referred to the Gastroenterology Clinic for evaluation of diarrhea and weight loss.  Symptoms started 25 days ago, having up to 5 loose/watery, nonbloody stools per day.  + Postprandial diarrhea and nocturnal stools.  Has unintentionally lost 16# since February.  +abdominal cramping, but no associated abdominal pain.  Did have a single episode of BRB mixed in stool last week.  That has not recurred.  Has been trying to maintain adequate fluids with Pedialyte, water, etc.  Does visit her husband in the nursing home frequently, but otherwise no recent ABX, hospitalization, travel, etc.  Started taking IBGard without much improvement.  Trialed Imodium and Pepto-Bismol last week w/o change.  No nausea, vomiting, fever.  Went to the ER with the symptoms on 12/18/2021. - Normal CBC including WBC 8.5, normal CMP, lipase, UA - FOBT negative - Negative C. difficile, GI PCR panel  06/03/2021, husband diagnosed with dementia. Was having increased stress with that dx and trying to care for him at home. Moved him to nursing home 09/21/21 (hospice, palliative care).  She started Zoloft 10/23/2021.   No recent abdominal imaging for review.  Endoscopic History: - Flexible sigmoidoscopy (10/1998): Normal - Colonoscopy (09/2003): Sigmoid diverticulosis, otherwise normal  Past Medical History:  Diagnosis Date   Allergy    Anxiety    Aortic stenosis    s/p AVR w/ 23 mm Edward Magna-ease Pericardial Valve   Arthritis    hands & knee- L    Coronary artery disease    Depression    Dysrhythmia    when taking antihistamines   Heart murmur    History of echocardiogram    Echo (8/15): EF 55-60%, normal wall motion, grade 1 diastolic dysfunction, AVR okay (mean 10 mm Hg),  normal RV function, moderate TR   Osteoporosis    Palpitations    Unspecified hypothyroidism    Vertigo      Past Surgical History:  Procedure Laterality Date   ABDOMINAL HYSTERECTOMY     AORTIC VALVE REPLACEMENT N/A 09/24/2013   Procedure: AORTIC VALVE REPLACEMENT (AVR) using Pericardial Tissue Valve (size 14m).;  Surgeon: BGaye Pollack MD;  Location: [facility] OR;  Service: Open Heart Surgery;  Laterality: N/A;   APPENDECTOMY     at time of hysterectomy   bilateral knee surgery  35 yrs. ago   cartilage removed from knees   CCliftonGRAFT N/A 09/24/2013   Procedure: CORONARY ARTERY BYPASS GRAFTING (CABG) x4: LIMA to LAD, SVG to Diagonal 1, SVG to Diagonal 2, Svg to OM 1.;  Surgeon: BGaye Pollack MD;  Location: [facility] OR;  Service: Open Heart Surgery;  Laterality: N/A;   FINGER SURGERY     left hand   INJECTION KNEE Left 03/04/2013   Procedure: left KNEE cortisone INJECTION;  Surgeon: FGearlean Alf MD;  Location: [facility] ORS;  Service: Orthopedics;  Laterality: Left;   INTRAOPERATIVE TRANSESOPHAGEAL ECHOCARDIOGRAM N/A 09/24/2013   Procedure: INTRAOPERATIVE TRANSESOPHAGEAL ECHOCARDIOGRAM;  Surgeon: BGaye Pollack MD;  [facility];  Service: Open Heart Surgery;  Laterality: N/A;   LEFT AND RIGHT HEART  CATHETERIZATION WITH CORONARY ANGIOGRAM N/A 09/17/2013   Procedure: LEFT AND RIGHT HEART CATHETERIZATION WITH CORONARY ANGIOGRAM;  Surgeon: Blane Ohara, MD;  Location: [facility] CATH LAB;  Service: Cardiovascular;  Laterality: N/A;   MASS EXCISION Right 05/17/2018   Procedure: EXCISION CYST RIGHT RING FINGER, DEBRIDEMENT DISTAL INTERPHALANGEAL RIGHT FINGER;  Surgeon: Daryll Brod, MD;  Location: [place];  Service: Orthopedics;  Laterality: Right;   MASS EXCISION Right 07/14/2020   Procedure: EXCISION CYST, DEBRIDEMENT DISTAL INTERPHALANGEAL JOINT RIGHT INDEX FINGER;  Surgeon: Daryll Brod, MD;  Location: [place];  Service: Orthopedics;  Laterality: Right;  IV REGIONAL FOREARM BLOCK; 60 MINUTES   TONSILLECTOMY     TOTAL KNEE ARTHROPLASTY Right 03/04/2013   Procedure: RIGHT TOTAL KNEE ARTHROPLASTY;  Surgeon: Gearlean Alf, MD;  Location: [facility] ORS;  Service: Orthopedics;  Laterality: Right;   Family History  Problem Relation Age of Onset   Heart disease Mother    Heart failure Mother    Esophageal cancer Father    Heart disease Father    Heart disease Sister    Hyperlipidemia Sister    Heart disease Sister    Heart disease Brother    ALS Brother    Social History   Tobacco Use   Smoking status: Never   Smokeless tobacco: Never  Vaping Use   Vaping Use: Never used  Substance Use Topics   Alcohol use: Yes    Alcohol/week: 1.0 standard drink of alcohol    Types: 1 Glasses of wine per week    Comment: 4 oz nightly red wine   Drug use: No   Current Outpatient Medications  Medication Sig Dispense Refill   acetaminophen (TYLENOL) 325 MG tablet Take 650 mg by mouth every 6 (six) hours as needed.     Ascorbic Acid (VITAMIN C) POWD Take by mouth. Taking 1/4 teaspoon daily     aspirin 81 MG tablet Take 81 mg by mouth daily.     cholecalciferol (VITAMIN D) 1000 UNITS tablet Take 2,000 Units by mouth daily.      CINNAMON PO Take by mouth. Takes 1/4 teaspoon daily     Cyanocobalamin (VITAMIN B-12 PO) Take by mouth as directed.     estradiol (ESTRACE) 0.5 MG tablet Take 0.5 mg by mouth daily before breakfast.      fluticasone (FLONASE) 50 MCG/ACT nasal spray Place 1 spray into both nostrils as needed.     folic acid (FOLVITE) 081 MCG tablet Take 400 mcg by mouth daily.     Glucosamine-Chondroitin (GLUCOSAMINE CHONDR COMPLEX PO) Take 1 tablet by mouth 2 (two) times daily.     hydrocortisone 2.5 % ointment Apply 1 Application topically as needed.     levothyroxine (SYNTHROID, LEVOTHROID) 50 MCG tablet Take 50 mcg by mouth daily.      lovastatin (MEVACOR) 20 MG tablet TAKE 1 TABLET (20  MG TOTAL) BY MOUTH AT BEDTIME. 90 tablet 3   methocarbamol (ROBAXIN) 500 MG tablet Take 1 tablet by mouth as needed.     metoprolol tartrate (LOPRESSOR) 25 MG tablet Take 0.5 tablets (12.5 mg total) by mouth 2 (two) times daily. 90 tablet 3   omega-3 acid ethyl esters (LOVAZA) 1 G capsule Take 1 g by mouth 2 (two) times daily.     sertraline (ZOLOFT) 50 MG tablet Take 50 mg by mouth daily.     traMADol (ULTRAM) 50 MG tablet Take 1 tablet (50 mg total) by mouth every 6 (six) hours as needed.  20 tablet 0   vitamin E 400 UNIT capsule Take 400 Units by mouth daily.     VITAMIN K PO Take by mouth.     amoxicillin (AMOXIL) 500 MG tablet Take 4 tablets ( 2 gms ) 1 hour before dental procedure (Patient not taking: Reported on 12/23/2021) 4 tablet 3   No current facility-administered medications for this visit.   Allergies  Allergen Reactions   Erythromycin Nausea Only    Feels like she is dying/out of her body   Erythromycin Base Other (See Comments)    Has out of body feeling   Monosodium Glutamate Other (See Comments)    Migraine headache    Nitrates, Organic     headache   Sulfamethoxazole Other (See Comments)    Makes her feel strange   Sulfonamide Derivatives Nausea Only    Feels like she is dying/out of her body   Ciprofloxacin Palpitations    Feels out of her body experience   Versed [Midazolam] Other (See Comments)    Pt experienced forgetfulness for days after and requests we do not use.     Review of Systems: All systems reviewed and negative except where noted in HPI.     Physical Exam:    Wt Readings from Last 3 Encounters:  12/23/21 104 lb 6 oz (47.3 kg)  09/08/21 108 lb (49 kg)  05/24/21 114 lb (51.7 kg)    BP 130/60 (BP Location: Left Arm, Patient Position: Sitting, Cuff Size: Normal)   Pulse 84   Ht 5' 1.5" (1.562 m) Comment: height measured without shoes  Wt 104 lb 6 oz (47.3 kg)   BMI 19.40 kg/m  Constitutional:  Pleasant, in no acute  distress. Psychiatric: Normal mood and affect. Behavior is normal. Neurological: Alert and oriented to person place and time. Skin: Skin is warm and dry. No rashes noted.   ASSESSMENT AND PLAN;   1) Diarrhea 2) Unintentional weight loss 3) Abdominal cramping  - EGD with random and directed biopsies - Colonoscopy with random and directed biopsies - Stool studies negative for infection. - Check fecal calprotectin, ESR, CRP, TSH, fecal pancreatic elastase - Rx for Lomotil - If work-up unrevealing, CT abdomen/pelvis especially given unintentional weight loss - Depending on response, could consider trial of Questran, budesonide?   The indications, risks, and benefits of EGD and colonoscopy were explained to the patient in detail. Risks include but are not limited to bleeding, perforation, adverse reaction to medications, and cardiopulmonary compromise. Sequelae include but are not limited to the possibility of surgery, hospitalization, and mortality. The patient verbalized understanding and wished to proceed. All questions answered, referred to scheduler and bowel prep ordered. Further recommendations pending results of the exam.      Lavena Bullion, DO, FACG  12/23/2021, 11:21 AM   [place], Fransico Him, MD

## 2021-12-23 NOTE — Telephone Encounter (Signed)
[facility] Pre-operative Risk Assessment     Request for surgical clearance:     Endoscopy Procedure  What type of surgery is being performed?     Endoscopy and Colonoscopy  When is this surgery scheduled?     12/30/21  What type of clearance is required ? Medical  Are there any medications that need to be held prior to surgery and how long? No  Practice name and name of physician performing surgery?  [facility], Dr. Bryan Lemma    What is your office phone and fax number?      Phone- [phone]  Fax506 [phone]  Anesthesia type (None, local, MAC, general) ?       MAC

## 2021-12-24 ENCOUNTER — Telehealth: Payer: Self-pay | Admitting: *Deleted

## 2021-12-24 DIAGNOSIS — R197 Diarrhea, unspecified: Secondary | ICD-10-CM | POA: Diagnosis not present

## 2021-12-24 DIAGNOSIS — R634 Abnormal weight loss: Secondary | ICD-10-CM | POA: Diagnosis not present

## 2021-12-24 NOTE — Telephone Encounter (Signed)
Consent was given, however the meds will need to be reviewd at tele appt. Pt hung up before I could ask her about her medications.

## 2021-12-24 NOTE — Telephone Encounter (Signed)
Consent was given, however the meds will need to be reviewd at tele appt. Pt hung up before I could ask her about her medications.     Patient Consent for Virtual Visit        Linda Parker has provided verbal consent on 12/24/2021 for a virtual visit (video or telephone).   CONSENT FOR VIRTUAL VISIT FOR:  Linda Parker  By participating in this virtual visit I agree to the following:  I hereby voluntarily request, consent and authorize [facility] and its employed or contracted physicians, physician assistants, nurse practitioners or other licensed health care professionals (the Practitioner), to provide me with telemedicine health care services (the "Services") as deemed necessary by the treating Practitioner. I acknowledge and consent to receive the Services by the Practitioner via telemedicine. I understand that the telemedicine visit will involve communicating with the Practitioner through live audiovisual communication technology and the disclosure of certain medical information by electronic transmission. I acknowledge that I have been given the opportunity to request an in-person assessment or other available alternative prior to the telemedicine visit and am voluntarily participating in the telemedicine visit.  I understand that I have the right to withhold or withdraw my consent to the use of telemedicine in the course of my care at any time, without affecting my right to future care or treatment, and that the Practitioner or I may terminate the telemedicine visit at any time. I understand that I have the right to inspect all information obtained and/or recorded in the course of the telemedicine visit and may receive copies of available information for a reasonable fee.  I understand that some of the potential risks of receiving the Services via telemedicine include:  Delay or interruption in medical evaluation due to technological equipment failure or disruption; Information  transmitted may not be sufficient (e.g. poor resolution of images) to allow for appropriate medical decision making by the Practitioner; and/or  In rare instances, security protocols could fail, causing a breach of personal health information.  Furthermore, I acknowledge that it is my responsibility to provide information about my medical history, conditions and care that is complete and accurate to the best of my ability. I acknowledge that Practitioner's advice, recommendations, and/or decision may be based on factors not within their control, such as incomplete or inaccurate data provided by me or distortions of diagnostic images or specimens that may result from electronic transmissions. I understand that the practice of medicine is not an exact science and that Practitioner makes no warranties or guarantees regarding treatment outcomes. I acknowledge that a copy of this consent can be made available to me via my patient portal ([facility]), or I can request a printed copy by calling the office of [facility].    I understand that my insurance will be billed for this visit.   I have read or had this consent read to me. I understand the contents of this consent, which adequately explains the benefits and risks of the Services being provided via telemedicine.  I have been provided ample opportunity to ask questions regarding this consent and the Services and have had my questions answered to my satisfaction. I give my informed consent for the services to be provided through the use of telemedicine in my medical care

## 2021-12-24 NOTE — Telephone Encounter (Signed)
I s/w the pt and she is agreeable to plan of care for tele pre op appt 12/28/21 at 11 am. Per pre op provider today the pt needed to be added on to Tuesday 12/28/21 as procedure is 12/30/21 and the office is closed 12/27/21 for Labor Day.   I d/w pre op provider for 12/28/21 who asked to schedule pt either at 11 am or 1:40 12/28/21. Pt opted for 11 am. I will update the requesting office as well.

## 2021-12-24 NOTE — Telephone Encounter (Signed)
Please arrange virtual telephone visit for cardiac clearance ?

## 2021-12-26 ENCOUNTER — Encounter: Payer: Self-pay | Admitting: Certified Registered Nurse Anesthetist

## 2021-12-27 NOTE — Progress Notes (Signed)
Virtual Visit via Telephone Note   Because of Linda Parker's co-morbid illnesses, she is at least at moderate risk for complications without adequate follow up.  This format is felt to be most appropriate for this patient at this time.  The patient did not have access to video technology/had technical difficulties with video requiring transitioning to audio format only (telephone).  All issues noted in this document were discussed and addressed.  No physical exam could be performed with this format.  Please refer to the patient's chart for her consent to telehealth for [facility].  Evaluation Performed:  Preoperative cardiovascular risk assessment _____________   Date:  12/28/2021   Patient ID:  Linda Parker, DOB Mar 26, 1936, MRN [id number] Patient Location:  Home Provider location:   Office  Primary Care Provider:  Haywood Pao, MD Primary Cardiologist:  [name], MD  Chief Complaint / Patient Profile   86 y.o. y/o female with a h/o AVR with bioprosthetic pericardial valve, CAD s/p CABG x 4 (LIMA-LAD, SVG-D1, SVG-D2, SVG-OM1) completed and June 2015. She additionally has hx of borderline dilatation of aortic arch, palpitations, and hyperlipidemia who is pending endoscopy and colonoscopy and presents today for telephonic preoperative cardiovascular risk assessment.  Past Medical History    Past Medical History:  Diagnosis Date   Allergy    Anxiety    Aortic stenosis    s/p AVR w/ 23 mm Edward Magna-ease Pericardial Valve   Arthritis    hands & knee- L    Coronary artery disease    Depression    Dysrhythmia    when taking antihistamines   Heart murmur    History of echocardiogram    Echo (8/15): EF 55-60%, normal wall motion, grade 1 diastolic dysfunction, AVR okay (mean 10 mm Hg), normal RV function, moderate TR   Osteoporosis    Palpitations    Unspecified hypothyroidism    Vertigo    Past Surgical History:  Procedure Laterality Date   ABDOMINAL  HYSTERECTOMY     AORTIC VALVE REPLACEMENT N/A 09/24/2013   Procedure: AORTIC VALVE REPLACEMENT (AVR) using Pericardial Tissue Valve (size 45m).;  Surgeon: BGaye Pollack MD;  Location: [facility] OR;  Service: Open Heart Surgery;  Laterality: N/A;   APPENDECTOMY     at time of hysterectomy   bilateral knee surgery  35 yrs. ago   cartilage removed from knees   [place] 09/[place]/2013   Procedure: CORONARY ARTERY BYPASS GRAFTING (CABG) x4: LIMA to LAD, SVG to Diagonal 1, SVG to Diagonal 2, Svg to OM 1.;  Surgeon: BGaye Pollack MD;  Location: [facility] OR;  Service: Open Heart Surgery;  Laterality: N/A;   FINGER SURGERY     left hand   INJECTION KNEE Left 03/04/2013   Procedure: left KNEE cortisone INJECTION;  Surgeon: FGearlean Alf MD;  Location: [facility] ORS;  Service: Orthopedics;  Laterality: Left;   INTRAOPERATIVE TRANSESOPHAGEAL ECHOCARDIOGRAM N/A 09/24/2013   Procedure: INTRAOPERATIVE TRANSESOPHAGEAL ECHOCARDIOGRAM;  Surgeon: BGaye Pollack MD;  [facility];  Service: Open Heart Surgery;  Laterality: N/A;   LEFT AND RIGHT HEART CATHETERIZATION WITH CORONARY ANGIOGRAM N/A 09/17/2013   Procedure: LEFT AND RIGHT HEART CATHETERIZATION WITH CORONARY ANGIOGRAM;  Surgeon: MBlane Ohara MD;  [facility];  Service: Cardiovascular;  Laterality: N/A;   MASS EXCISION Right 05/17/2018   Procedure: EXCISION CYST RIGHT RING FINGER, DEBRIDEMENT DISTAL  INTERPHALANGEAL RIGHT FINGER;  Surgeon: Daryll Brod, MD;  Location: [place];  Service: Orthopedics;  Laterality: Right;   MASS EXCISION Right 07/14/2020   Procedure: EXCISION CYST, DEBRIDEMENT DISTAL INTERPHALANGEAL JOINT RIGHT INDEX FINGER;  Surgeon: Daryll Brod, MD;  Location: [place];  Service: Orthopedics;  Laterality: Right;  IV REGIONAL FOREARM BLOCK; 60 MINUTES   TONSILLECTOMY     TOTAL KNEE ARTHROPLASTY Right 03/04/2013   Procedure: RIGHT  TOTAL KNEE ARTHROPLASTY;  Surgeon: Gearlean Alf, MD;  Location: [facility];  Service: Orthopedics;  Laterality: Right;    Allergies  Allergies  Allergen Reactions   Erythromycin Nausea Only    Feels like she is dying/out of her body   Erythromycin Base Other (See Comments)    Has out of body feeling   Monosodium Glutamate Other (See Comments)    Migraine headache    Nitrates, Organic     headache   Sulfamethoxazole Other (See Comments)    Makes her feel strange   Sulfonamide Derivatives Nausea Only    Feels like she is dying/out of her body   Ciprofloxacin Palpitations    Feels out of her body experience   Versed [Midazolam] Other (See Comments)    Pt experienced forgetfulness for days after and requests we do not use.    History of Present Illness    Linda Parker is a 86 y.o. female who presents via audio/video conferencing for a telehealth visit today.  Pt was last seen in cardiology clinic on 09/08/21 by Dr. Stanford Breed.  At that time Linda Parker was doing well.  The patient is now pending procedure as outlined above. Since her last visit, she  denies chest pain, shortness of breath, lower extremity edema, fatigue, palpitations, melena, hematuria, hemoptysis, diaphoresis, weakness, presyncope, syncope, orthopnea, and PND. She is having frequent diarrhea which is the reason she is having the EGD/colonoscopy. Remains very active at home and walks for exercise.   Home Medications    Prior to Admission medications   Medication Sig Start Date End Date Taking? Authorizing Provider  acetaminophen (TYLENOL) 325 MG tablet Take 650 mg by mouth every 6 (six) hours as needed.    [provider]  amoxicillin (AMOXIL) 500 MG tablet Take 4 tablets ( 2 gms ) 1 hour before dental procedure Patient not taking: Reported on 12/23/2021 02/19/21   Lelon Perla, MD  Ascorbic Acid (VITAMIN C) POWD Take by mouth. Taking 1/4 teaspoon daily    [provider]  aspirin 81 MG tablet Take  81 mg by mouth daily.    [provider]  cholecalciferol (VITAMIN D) 1000 UNITS tablet Take 2,000 Units by mouth daily.     [provider]  CINNAMON PO Take by mouth. Takes 1/4 teaspoon daily    [provider]  Cyanocobalamin (VITAMIN B-12 PO) Take by mouth as directed.    [provider]  diphenoxylate-atropine (LOMOTIL) 2.5-0.025 MG tablet Take 1 tablet by mouth 4 (four) times daily as needed for diarrhea or loose stools. 12/23/21   Cirigliano, Vito V, DO  estradiol (ESTRACE) 0.5 MG tablet Take 0.5 mg by mouth daily before breakfast.     [provider]  fluticasone (FLONASE) 50 MCG/ACT nasal spray Place 1 spray into both nostrils as needed.    [provider]  folic acid (FOLVITE) 235 MCG tablet Take 400 mcg by mouth daily.    [provider]  Glucosamine-Chondroitin (GLUCOSAMINE CHONDR COMPLEX PO) Take 1 tablet  by mouth 2 (two) times daily.    [provider]  hydrocortisone 2.5 % ointment Apply 1 Application topically as needed.    [provider]  levothyroxine (SYNTHROID, LEVOTHROID) 50 MCG tablet Take 50 mcg by mouth daily.     [provider]  lovastatin (MEVACOR) 20 MG tablet TAKE 1 TABLET (20 MG TOTAL) BY MOUTH AT BEDTIME. 11/30/20   Crenshaw, Denice Bors, MD  methocarbamol (ROBAXIN) 500 MG tablet Take 1 tablet by mouth as needed. 11/11/20   [provider]  metoprolol tartrate (LOPRESSOR) 25 MG tablet Take 0.5 tablets (12.5 mg total) by mouth 2 (two) times daily. 12/07/21   Lelon Perla, MD  omega-3 acid ethyl esters (LOVAZA) 1 G capsule Take 1 g by mouth 2 (two) times daily.    [provider]  sertraline (ZOLOFT) 50 MG tablet Take 50 mg by mouth daily. 10/28/21   [provider]  traMADol (ULTRAM) 50 MG tablet Take 1 tablet (50 mg total) by mouth every 6 (six) hours as needed. 07/14/20   Daryll Brod, MD  vitamin E 400 UNIT capsule Take 400 Units by mouth daily.    [provider]  VITAMIN K PO Take by mouth.    [provider]    Physical Exam    Vital Signs:  Linda Parker does not have vital signs available for review today.  Given telephonic nature of communication, physical exam is limited. AAOx3. NAD. Normal affect.  Speech and respirations are unlabored.  Accessory Clinical Findings    None  Assessment & Plan    1.  Preoperative Cardiovascular Risk Assessment: The patient is doing well from a cardiac perspective. Therefore, based on ACC/AHA guidelines, the patient would be at acceptable risk for the planned procedure without further cardiovascular testing. The patient was advised that if he develops new symptoms prior to surgery to contact our office to arrange for a follow-up visit, and he verbalized understanding. According to the Revised Cardiac Risk Index (RCRI), her Perioperative Risk of Major Cardiac Event is (%): 0.4. Her Functional Capacity in METs is: 6.05 according to the Duke Activity Status Index (DASI).  SBE prophylaxis is not needed for GI procedures.   A copy of this note will be routed to requesting surgeon.  Time:   Today, I have spent 10 minutes with the patient with telehealth technology discussing medical history, symptoms, and management plan.     Emmaline Life, NP-C    12/28/2021, 11:09 AM [address], [address] Office [phone] Fax [phone]

## 2021-12-28 ENCOUNTER — Ambulatory Visit: Payer: Medicare HMO | Attending: Cardiovascular Disease | Admitting: Nurse Practitioner

## 2021-12-28 ENCOUNTER — Encounter: Payer: Self-pay | Admitting: Nurse Practitioner

## 2021-12-28 ENCOUNTER — Ambulatory Visit: Payer: Medicare HMO | Admitting: Gastroenterology

## 2021-12-28 DIAGNOSIS — Z01810 Encounter for preprocedural cardiovascular examination: Secondary | ICD-10-CM | POA: Diagnosis not present

## 2021-12-28 MED ORDER — AMOXICILLIN 500 MG PO TABS: ORAL_TABLET | ORAL | 3 refills | Status: DC

## 2021-12-30 ENCOUNTER — Encounter: Payer: Self-pay | Admitting: Gastroenterology

## 2021-12-30 ENCOUNTER — Ambulatory Visit (AMBULATORY_SURGERY_CENTER): Payer: Medicare HMO | Admitting: Gastroenterology

## 2021-12-30 ENCOUNTER — Encounter: Payer: Medicare HMO | Admitting: Gastroenterology

## 2021-12-30 VITALS — BP 146/83 | HR 71 | Temp 97.8°F | Resp 15 | Ht 61.5 in | Wt 104.0 lb

## 2021-12-30 DIAGNOSIS — K2970 Gastritis, unspecified, without bleeding: Secondary | ICD-10-CM

## 2021-12-30 DIAGNOSIS — K2951 Unspecified chronic gastritis with bleeding: Secondary | ICD-10-CM | POA: Diagnosis not present

## 2021-12-30 DIAGNOSIS — K317 Polyp of stomach and duodenum: Secondary | ICD-10-CM | POA: Diagnosis not present

## 2021-12-30 DIAGNOSIS — R634 Abnormal weight loss: Secondary | ICD-10-CM

## 2021-12-30 DIAGNOSIS — I2510 Atherosclerotic heart disease of native coronary artery without angina pectoris: Secondary | ICD-10-CM | POA: Diagnosis not present

## 2021-12-30 DIAGNOSIS — K259 Gastric ulcer, unspecified as acute or chronic, without hemorrhage or perforation: Secondary | ICD-10-CM

## 2021-12-30 DIAGNOSIS — K5730 Diverticulosis of large intestine without perforation or abscess without bleeding: Secondary | ICD-10-CM | POA: Diagnosis not present

## 2021-12-30 DIAGNOSIS — K2950 Unspecified chronic gastritis without bleeding: Secondary | ICD-10-CM | POA: Diagnosis not present

## 2021-12-30 DIAGNOSIS — K254 Chronic or unspecified gastric ulcer with hemorrhage: Secondary | ICD-10-CM | POA: Diagnosis not present

## 2021-12-30 DIAGNOSIS — R197 Diarrhea, unspecified: Secondary | ICD-10-CM

## 2021-12-30 DIAGNOSIS — K52832 Lymphocytic colitis: Secondary | ICD-10-CM

## 2021-12-30 DIAGNOSIS — R109 Unspecified abdominal pain: Secondary | ICD-10-CM

## 2021-12-30 DIAGNOSIS — D122 Benign neoplasm of ascending colon: Secondary | ICD-10-CM

## 2021-12-30 LAB — CALPROTECTIN, FECAL: Calprotectin, Fecal: <5 ug/g (ref 0–120)

## 2021-12-30 MED ORDER — OMEPRAZOLE 40 MG PO CPDR: 40.0000 mg | DELAYED_RELEASE_CAPSULE | Freq: Two times a day (BID) | ORAL | 3 refills | Status: DC

## 2021-12-30 MED ORDER — SODIUM CHLORIDE 0.9 % IV SOLN: 500.0000 mL | Freq: Once | INTRAVENOUS | Status: AC

## 2021-12-30 NOTE — Progress Notes (Signed)
1423 Robinul 0.1 mg IV given due large amount of secretions upon assessment.  MD made aware, vss

## 2021-12-30 NOTE — Op Note (Signed)
[facility] Patient Name: Linda Parker Procedure Date: 12/30/2021 2:22 PM MRN: [id number] Endoscopist: Vito Cirigliano , MD Age: 86 Referring MD:  Date of Birth: 06/18/1935 Gender: Female Account #: [id number] Procedure:                Colonoscopy Indications:              Epigastric abdominal cramping, Change in bowel                            habits, Diarrhea, Weight loss                           Recent evaluation has been unrevealing, to include                            normal/negative calprotectin, TSH, ESR, CRP, GI PCR                            panel, C diff panel. Pancreatic elastase pending. Medicines:                Monitored Anesthesia Care Procedure:                Pre-Anesthesia Assessment:                           - Prior to the procedure, a History and Physical                            was performed, and patient medications and                            allergies were reviewed. The patient's tolerance of                            previous anesthesia was also reviewed. The risks                            and benefits of the procedure and the sedation                            options and risks were discussed with the patient.                            All questions were answered, and informed consent                            was obtained. Prior Anticoagulants: The patient has                            taken no previous anticoagulant or antiplatelet                            agents. ASA Grade Assessment: III - A patient with                              severe systemic disease. After reviewing the risks                            and benefits, the patient was deemed in                            satisfactory condition to undergo the procedure.                           - Prior to the procedure, a History and Physical                            was performed, and patient medications and                            allergies were reviewed. The patient's  tolerance of                            previous anesthesia was also reviewed. The risks                            and benefits of the procedure and the sedation                            options and risks were discussed with the patient.                            All questions were answered, and informed consent                            was obtained. Prior Anticoagulants: The patient has                            taken no previous anticoagulant or antiplatelet                            agents. ASA Grade Assessment: III - A patient with                            severe systemic disease. After reviewing the risks                            and benefits, the patient was deemed in                            satisfactory condition to undergo the procedure.                           After obtaining informed consent, the colonoscope                            was passed under direct vision. Throughout the                              procedure, the patient's blood pressure, pulse, and                            oxygen saturations were monitored continuously. The                            Olympus PCF-H190DL ([id number]) Colonoscope was                            introduced through the anus and advanced to the the                            terminal ileum. The colonoscopy was performed                            without difficulty. The patient tolerated the                            procedure well. The quality of the bowel                            preparation was good. The terminal ileum, ileocecal                            valve, appendiceal orifice, and rectum were                            photographed. Scope In: 2:40:12 PM Scope Out: 2:57:51 PM Scope Withdrawal Time: 0 hours 14 minutes 52 seconds  Total Procedure Duration: 0 hours 17 minutes 39 seconds  Findings:                 The perianal and digital rectal examinations were                            normal.                            A 4 mm polyp was found in the ascending colon. The                            polyp was sessile. The polyp was removed with a                            cold snare. Resection and retrieval were complete.                            Estimated blood loss was minimal.                           Multiple small and large-mouthed diverticula were                            found in the entire colon.  Normal mucosa was found in the entire colon.                            Biopsies for histology were taken with a cold                            forceps from the right colon and left colon for                            evaluation of microscopic colitis. Estimated blood                            loss was minimal.                           The retroflexed view of the distal rectum and anal                            verge was normal and showed no anal or rectal                            abnormalities.                           The terminal ileum appeared normal. Complications:            No immediate complications. Estimated Blood Loss:     Estimated blood loss was minimal. Impression:               - One 4 mm polyp in the ascending colon, removed                            with a cold snare. Resected and retrieved.                           - Diverticulosis in the entire examined colon.                           - Normal mucosa in the entire examined colon.                            Biopsied.                           - The distal rectum and anal verge are normal on                            retroflexion view.                           - The examined portion of the ileum was normal. Recommendation:           - Patient has a contact number available for                            emergencies.  The signs and symptoms of potential                            delayed complications were discussed with the                            patient. Return to normal activities  tomorrow.                            Written discharge instructions were provided to the                            patient.                           - Resume previous diet.                           - Continue present medications.                           - Await pathology results.                           - Repeat colonoscopy for screening purposes is not                            recommended due to current age (66 years or older)                            and findings on today's study.                           - Use fiber, for example Citrucel, Fibercon, Konsyl                            or Metamucil. Vito Cirigliano, MD 12/30/2021 3:10:01 PM 

## 2021-12-30 NOTE — Progress Notes (Signed)
Vitals-DT  Pt's states no medical or surgical changes since previsit or office visit.  

## 2021-12-30 NOTE — Patient Instructions (Signed)
Handouts on polyp, diverticulosis, and gastritis given to you today Fiber supplement recommended  Use Prilosec (omeprazole) 40 mg PO BID for 4 weeks to promote mucosal healing, then reduce to 40 mg daily and titrate off if no further upper GI symptoms.  YOU HAD AN ENDOSCOPIC PROCEDURE TODAY AT [facility]:   Refer to the procedure report that was given to you for any specific questions about what was found during the examination.  If the procedure report does not answer your questions, please call your gastroenterologist to clarify.  If you requested that your care partner not be given the details of your procedure findings, then the procedure report has been included in a sealed envelope for you to review at your convenience later.  YOU SHOULD EXPECT: Some feelings of bloating in the abdomen. Passage of more gas than usual.  Walking can help get rid of the air that was put into your GI tract during the procedure and reduce the bloating. If you had a lower endoscopy (such as a colonoscopy or flexible sigmoidoscopy) you may notice spotting of blood in your stool or on the toilet paper. If you underwent a bowel prep for your procedure, you may not have a normal bowel movement for a few days.  Please Note:  You might notice some irritation and congestion in your nose or some drainage.  This is from the oxygen used during your procedure.  There is no need for concern and it should clear up in a day or so.  SYMPTOMS TO REPORT IMMEDIATELY:  Following lower endoscopy (colonoscopy or flexible sigmoidoscopy):  Excessive amounts of blood in the stool  Significant tenderness or worsening of abdominal pains  Swelling of the abdomen that is new, acute  Fever of 100F or higher  Following upper endoscopy (EGD)  Vomiting of blood or coffee ground material  New chest pain or pain under the shoulder blades  Painful or persistently difficult swallowing  New shortness of breath  Fever of 100F  or higher  Black, tarry-looking stools  For urgent or emergent issues, a gastroenterologist can be reached at any hour by calling [phone]. Do not use MyChart messaging for urgent concerns.    DIET:  We do recommend a small meal at first, but then you may proceed to your regular diet.  Drink plenty of fluids but you should avoid alcoholic beverages for 24 hours.  ACTIVITY:  You should plan to take it easy for the rest of today and you should NOT DRIVE or use heavy machinery until tomorrow (because of the sedation medicines used during the test).    FOLLOW UP: Our staff will call the number listed on your records the next business day following your procedure.  We will call around 7:15- 8:00 am to check on you and address any questions or concerns that you may have regarding the information given to you following your procedure. If we do not reach you, we will leave a message.  If you develop any symptoms (ie: fever, flu-like symptoms, shortness of breath, cough etc.) before then, please call [phone].  If you test positive for Covid 19 in the 2 weeks post procedure, please call and report this information to [place].    If any biopsies were taken you will be contacted by phone or by letter within the next 1-3 weeks.  Please call us at [phone] if you have not heard about the biopsies in 3 weeks.    SIGNATURES/CONFIDENTIALITY: You and/or  your care partner have signed paperwork which will be entered into your electronic medical record.  These signatures attest to the fact that that the information above on your After Visit Summary has been reviewed and is understood.  Full responsibility of the confidentiality of this discharge information lies with you and/or your care-partner.

## 2021-12-30 NOTE — Progress Notes (Signed)
GASTROENTEROLOGY PROCEDURE H&P NOTE   Primary Care Physician: Haywood Pao, MD    Reason for Procedure:   Diarrhea, weight loss, change in stools, abdominal cramping  Plan:    EGD, colonoscopy   Patient is appropriate for endoscopic procedure(s) in the ambulatory ([facility]) setting.  The nature of the procedure, as well as the risks, benefits, and alternatives were carefully and thoroughly reviewed with the patient. Ample time for discussion and questions allowed. The patient understood, was satisfied, and agreed to proceed.     HPI: Linda Parker is a 86 y.o. female who presents for EGD and colonoscopy for evaluation of wt loss, diarrhea, change in stools, abdominal cramping.  Patient was most recently seen in the Gastroenterology Clinic on 12/23/2021 by me.  No interval change in medical history since that appointment. Please refer to that note for full details regarding GI history and clinical presentation.   Normal/negative calprotectin, TSH, ESR, CRP, GI PCR panel, C diff panel. Pancreatic elastase pending.   Past Medical History:  Diagnosis Date   Allergy    Anxiety    Aortic stenosis    s/p AVR w/ 23 mm Edward Magna-ease Pericardial Valve   Arthritis    hands & knee- L    Coronary artery disease    Depression    Dysrhythmia    when taking antihistamines   Heart murmur    History of echocardiogram    Echo (8/15): EF 55-60%, normal wall motion, grade 1 diastolic dysfunction, AVR okay (mean 10 mm Hg), normal RV function, moderate TR   Osteoporosis    Palpitations    Unspecified hypothyroidism    Vertigo     Past Surgical History:  Procedure Laterality Date   ABDOMINAL HYSTERECTOMY     AORTIC VALVE REPLACEMENT N/A 09/24/2013   Procedure: AORTIC VALVE REPLACEMENT (AVR) using Pericardial Tissue Valve (size 23m).;  Surgeon: BGaye Pollack MD;  Location: [facility];  Service: Open Heart Surgery;  Laterality: N/A;   APPENDECTOMY     at time of hysterectomy   bilateral  knee surgery  35 yrs. ago   cartilage removed from knees   [place] 09[place]2013   Procedure: CORONARY ARTERY BYPASS GRAFTING (CABG) x4: LIMA to LAD, SVG to Diagonal 1, SVG to Diagonal 2, Svg to OM 1.;  Surgeon: BGaye Pollack MD;  Location: [facility] OR;  Service: Open Heart Surgery;  Laterality: N/A;   FINGER SURGERY     left hand   INJECTION KNEE Left 03/04/2013   Procedure: left KNEE cortisone INJECTION;  Surgeon: FGearlean Alf MD;  Location: [facility];  Service: Orthopedics;  Laterality: Left;   INTRAOPERATIVE TRANSESOPHAGEAL ECHOCARDIOGRAM N/A 09/24/2013   Procedure: INTRAOPERATIVE TRANSESOPHAGEAL ECHOCARDIOGRAM;  Surgeon: BGaye Pollack MD;  [facility];  Service: Open Heart Surgery;  Laterality: N/A;   LEFT AND RIGHT HEART CATHETERIZATION WITH CORONARY ANGIOGRAM N/A 09/17/2013   Procedure: LEFT AND RIGHT HEART CATHETERIZATION WITH CORONARY ANGIOGRAM;  Surgeon: MBlane Ohara MD;  [facility];  Service: Cardiovascular;  Laterality: N/A;   MASS EXCISION Right 05/17/2018   Procedure: EXCISION CYST RIGHT RING FINGER, DEBRIDEMENT DISTAL INTERPHALANGEAL RIGHT FINGER;  Surgeon: KDaryll Brod MD;  [facility]  Service: Orthopedics;  Laterality: Right;   MASS EXCISION Right 07/14/2020   Procedure: EXCISION CYST, DEBRIDEMENT DISTAL INTERPHALANGEAL JOINT RIGHT INDEX FINGER;  Surgeon: KDaryll Brod MD;  Location: [facility];  Service: Orthopedics;  Laterality: Right;  IV REGIONAL FOREARM BLOCK; 60 MINUTES   TONSILLECTOMY     TOTAL KNEE ARTHROPLASTY Right 03/04/2013   Procedure: RIGHT TOTAL KNEE ARTHROPLASTY;  Surgeon: Gearlean Alf, MD;  Location: [facility];  Service: Orthopedics;  Laterality: Right;    Prior to Admission medications   Medication Sig Start Date End Date Taking? Authorizing Provider  Ascorbic Acid (VITAMIN C) POWD Take by mouth. Taking 1/4 teaspoon daily   Yes  [provider]  aspirin 81 MG tablet Take 81 mg by mouth daily.   Yes [provider]  cholecalciferol (VITAMIN D) 1000 UNITS tablet Take 2,000 Units by mouth daily.    Yes [provider]  CINNAMON PO Take by mouth. Takes 1/4 teaspoon daily   Yes [provider]  Cyanocobalamin (VITAMIN B-12 PO) Take by mouth as directed.   Yes [provider]  diphenoxylate-atropine (LOMOTIL) 2.5-0.025 MG tablet Take 1 tablet by mouth 4 (four) times daily as needed for diarrhea or loose stools. 12/23/21  Yes Rozell Theiler V, DO  estradiol (ESTRACE) 0.5 MG tablet Take 0.5 mg by mouth daily before breakfast.    Yes [provider]  fluticasone (FLONASE) 50 MCG/ACT nasal spray Place 1 spray into both nostrils as needed.   Yes [provider]  folic acid (FOLVITE) 270 MCG tablet Take 400 mcg by mouth daily.   Yes [provider]  Glucosamine-Chondroitin (GLUCOSAMINE CHONDR COMPLEX PO) Take 1 tablet by mouth 2 (two) times daily.   Yes [provider]  levothyroxine (SYNTHROID, LEVOTHROID) 50 MCG tablet Take 50 mcg by mouth daily.    Yes [provider]  lovastatin (MEVACOR) 20 MG tablet TAKE 1 TABLET (20 MG TOTAL) BY MOUTH AT BEDTIME. 11/30/20  Yes Crenshaw, Denice Bors, MD  metoprolol tartrate (LOPRESSOR) 25 MG tablet Take 0.5 tablets (12.5 mg total) by mouth 2 (two) times daily. 12/07/21  Yes Lelon Perla, MD  omega-3 acid ethyl esters (LOVAZA) 1 G capsule Take 1 g by mouth 2 (two) times daily.   Yes [provider]  sertraline (ZOLOFT) 50 MG tablet Take 50 mg by mouth daily. 10/28/21  Yes [provider]  vitamin E 400 UNIT capsule Take 400 Units by mouth daily.   Yes [provider]  VITAMIN K PO Take by mouth.   Yes [provider]  acetaminophen (TYLENOL) 325 MG tablet Take 650 mg by mouth every 6 (six) hours as needed.    [provider]  hydrocortisone 2.5 % ointment Apply 1  Application topically as needed.    [provider]  methocarbamol (ROBAXIN) 500 MG tablet Take 1 tablet by mouth as needed. 11/11/20   [provider]  traMADol (ULTRAM) 50 MG tablet Take 1 tablet (50 mg total) by mouth every 6 (six) hours as needed. 07/14/20   Daryll Brod, MD    Current Outpatient Medications  Medication Sig Dispense Refill   Ascorbic Acid (VITAMIN C) POWD Take by mouth. Taking 1/4 teaspoon daily     aspirin 81 MG tablet Take 81 mg by mouth daily.     cholecalciferol (VITAMIN D) 1000 UNITS tablet Take 2,000 Units by mouth daily.      CINNAMON PO Take by mouth. Takes 1/4 teaspoon daily     Cyanocobalamin (VITAMIN B-12 PO) Take by mouth as directed.     diphenoxylate-atropine (LOMOTIL) 2.5-0.025 MG tablet Take 1 tablet by mouth 4 (four) times daily as  needed for diarrhea or loose stools. 100 tablet 1   estradiol (ESTRACE) 0.5 MG tablet Take 0.5 mg by mouth daily before breakfast.      fluticasone (FLONASE) 50 MCG/ACT nasal spray Place 1 spray into both nostrils as needed.     folic acid (FOLVITE) 062 MCG tablet Take 400 mcg by mouth daily.     Glucosamine-Chondroitin (GLUCOSAMINE CHONDR COMPLEX PO) Take 1 tablet by mouth 2 (two) times daily.     levothyroxine (SYNTHROID, LEVOTHROID) 50 MCG tablet Take 50 mcg by mouth daily.      lovastatin (MEVACOR) 20 MG tablet TAKE 1 TABLET (20 MG TOTAL) BY MOUTH AT BEDTIME. 90 tablet 3   metoprolol tartrate (LOPRESSOR) 25 MG tablet Take 0.5 tablets (12.5 mg total) by mouth 2 (two) times daily. 90 tablet 3   omega-3 acid ethyl esters (LOVAZA) 1 G capsule Take 1 g by mouth 2 (two) times daily.     sertraline (ZOLOFT) 50 MG tablet Take 50 mg by mouth daily.     vitamin E 400 UNIT capsule Take 400 Units by mouth daily.     VITAMIN K PO Take by mouth.     acetaminophen (TYLENOL) 325 MG tablet Take 650 mg by mouth every 6 (six) hours as needed.     hydrocortisone 2.5 % ointment Apply 1 Application topically as needed.      methocarbamol (ROBAXIN) 500 MG tablet Take 1 tablet by mouth as needed.     traMADol (ULTRAM) 50 MG tablet Take 1 tablet (50 mg total) by mouth every 6 (six) hours as needed. 20 tablet 0   Current Facility-Administered Medications  Medication Dose Route Frequency Provider Last Rate Last Admin   0.9 %  sodium chloride infusion  500 mL Intravenous Once Cavan Bearden V, DO        Allergies as of 12/30/2021 - Review Complete 12/30/2021  Allergen Reaction Noted   Erythromycin Nausea Only    Erythromycin base Other (See Comments) 10/23/2017   Monosodium glutamate Other (See Comments) 09/20/2013   Nitrates, organic  09/20/2013   Sulfamethoxazole Other (See Comments) 10/23/2017   Sulfonamide derivatives Nausea Only    Ciprofloxacin Palpitations 02/27/2016   Versed [midazolam] Other (See Comments) 05/10/2018    Family History  Problem Relation Age of Onset   Heart disease Mother    Heart failure Mother    Esophageal cancer Father    Heart disease Father    Heart disease Sister    Hyperlipidemia Sister    Heart disease Sister    Heart disease Brother    ALS Brother     Social History   Socioeconomic History   Marital status: Married    Spouse name: Not on file   Number of children: 3   Years of education: Not on file   Highest education level: Not on file  Occupational History   Occupation: retired  Tobacco Use   Smoking status: Never   Smokeless tobacco: Never  Vaping Use   Vaping Use: Never used  Substance and Sexual Activity   Alcohol use: Yes    Alcohol/week: 1.0 standard drink of alcohol    Types: 1 Glasses of wine per week    Comment: 4 oz nightly red wine   Drug use: No   Sexual activity: Not Currently    Birth control/protection: Post-menopausal, Surgical  Other Topics Concern   Not on file  Social History Narrative   Not on file   Social Determinants of Health   Financial  Resource Strain: Not on file  Food Insecurity: Not on file  Transportation  Needs: Not on file  Physical Activity: Not on file  Stress: Not on file  Social Connections: Not on file  Intimate Partner Violence: Not on file    Physical Exam: Vital signs in last 24 hours: '@BP'  (!) 146/74 (BP Location: Right Arm, Patient Position: Sitting, Cuff Size: Normal)   Pulse 69   Temp 97.8 F (36.6 C) (Temporal)   Ht 5' 1.5" (1.562 m)   Wt 104 lb (47.2 kg)   SpO2 96%   BMI 19.33 kg/m  GEN: NAD EYE: Sclerae anicteric ENT: MMM CV: Non-tachycardic Pulm: CTA b/l GI: Soft, NT/ND NEURO:  Alert & Oriented x 3   Gerrit Heck, DO [facility]   12/30/2021 2:19 PM

## 2021-12-30 NOTE — Progress Notes (Signed)
Report given to PACU, vss 

## 2021-12-30 NOTE — Progress Notes (Signed)
Called to room to assist during endoscopic procedure.  Patient ID and intended procedure confirmed with present staff. Received instructions for my participation in the procedure from the performing physician.  

## 2021-12-30 NOTE — Op Note (Signed)
[facility] Patient Name: Linda Parker Procedure Date: 12/30/2021 2:22 PM MRN: [id number] Endoscopist: Gerrit Heck , MD Age: 86 Referring MD:  Date of Birth: [birth year] Gender: Female Account #: [id number] Procedure:                Upper GI endoscopy with biopsies Indications:              Generalized abdominal cramping, Diarrhea, Weight                            loss Medicines:                Monitored Anesthesia Care Procedure:                Pre-Anesthesia Assessment:                           - Prior to the procedure, a History and Physical                            was performed, and patient medications and                            allergies were reviewed. The patient's tolerance of                            previous anesthesia was also reviewed. The risks                            and benefits of the procedure and the sedation                            options and risks were discussed with the patient.                            All questions were answered, and informed consent                            was obtained. Prior Anticoagulants: The patient has                            taken no previous anticoagulant or antiplatelet                            agents. ASA Grade Assessment: III - A patient with                            severe systemic disease. After reviewing the risks                            and benefits, the patient was deemed in                            satisfactory condition to undergo the procedure.  After obtaining informed consent, the endoscope was                            passed under direct vision. Throughout the                            procedure, the patient's blood pressure, pulse, and                            oxygen saturations were monitored continuously. The                            GIF [id number] #[id number] was introduced through the                            mouth, and advanced to the third part of  duodenum.                            The upper GI endoscopy was accomplished without                            difficulty. The patient tolerated the procedure                            well. Scope In: Scope Out: Findings:                 The examined esophagus was normal.                           The Z-line was regular and was found 37 cm from the                            incisors.                           Localized mild inflammation characterized by                            congestion (edema), erythema, and shallow                            ulcerations was found in the gastric antrum.                            Biopsies were taken with a cold forceps for                            histology and Helicobacter pylori testing.                            Estimated blood loss was minimal.                           A single 3 mm sessile polyp with no bleeding was  found in the cardia. The polyp was removed with a                            cold biopsy forceps. Resection and retrieval were                            complete. Estimated blood loss was minimal. The                            remainder of the stomach was normal appearing.                           The examined duodenum was normal. Biopsies were                            taken with a cold forceps for histology. Estimated                            blood loss was minimal. Complications:            No immediate complications. Estimated Blood Loss:     Estimated blood loss was minimal. Impression:               - Normal esophagus.                           - Z-line regular, 37 cm from the incisors.                           - Antral gastritis with shallow ulcers. Biopsied.                           - A single gastric polyp. Resected and retrieved.                           - Normal examined duodenum. Biopsied. Recommendation:           - Patient has a contact number available for                             emergencies. The signs and symptoms of potential                            delayed complications were discussed with the                            patient. Return to normal activities tomorrow.                            Written discharge instructions were provided to the                            patient.                           - Resume previous diet.                           -  Continue present medications.                           - Await pathology results.                           - Perform a colonoscopy today.                           - Use Prilosec (omeprazole) 40 mg PO BID for 4                            weeks to promote mucosal healing, then reduce to 40                            mg daily and titrate off if no further upper GI                            symptoms. Gerrit Heck, MD 12/30/2021 3:05:27 PM

## 2021-12-31 ENCOUNTER — Telehealth: Payer: Self-pay | Admitting: *Deleted

## 2021-12-31 NOTE — Telephone Encounter (Signed)
  Follow up Call-     12/30/2021    1:36 PM 12/30/2021    1:24 PM  Call back number  Post procedure Call Back phone  # [phone]   Permission to leave phone message  Yes     Patient questions:  Do you have a fever, pain , or abdominal swelling? No. Pain Score  0 *  Have you tolerated food without any problems? Yes.    Have you been able to return to your normal activities? Yes.    Do you have any questions about your discharge instructions: Diet   No. Medications  No. Follow up visit  No.  Do you have questions or concerns about your Care? No.  Actions: * If pain score is 4 or above: No action needed, pain <4.

## 2022-01-01 ENCOUNTER — Telehealth: Payer: Self-pay | Admitting: Gastroenterology

## 2022-01-01 LAB — PANCREATIC ELASTASE, FECAL: Pancreatic Elastase-1, Stool: >500 ug/g

## 2022-01-01 NOTE — Telephone Encounter (Signed)
Patient called in this AM with medication question. I have called her back at number listed and goes straight to voicemail. I left her a message letting her know we were returning her call. I then called back a few minutes later and same thing occurred. Will try again to call her this morning.

## 2022-01-01 NOTE — Telephone Encounter (Signed)
I called the patient back. She had several questions about the medications that were prescribed to her post procedure, she had an EGD and colonoscopy last week. She had been given omeprazole and lomotil and is concerned about potential interactions. I put them through the up to date drug interaction database and don't see any interactions listed between the two. She asked me to do that for zoloft as well and again don't see any concerning interactions. She had several questions about long term plan and medications for her condition, I answered them but we need to await biopsy results. She had more questions about plan which I will defer to Dr. Bryan Lemma, she asked that he call her next week.  [place], [place]. I answered all of her acute questions this AM and reassured her, she has a lot more she wants to discuss with you if you can give her a ring back sometime next week. Thanks

## 2022-01-03 ENCOUNTER — Telehealth: Payer: Self-pay | Admitting: Cardiology

## 2022-01-03 NOTE — Telephone Encounter (Signed)
Richardson Landry, thanks for taking care of her and answering her questions over the weekend. Very much appreciate it.   Mickel Baas, can you please reach out to [facility] today and see how she is doing and what other issues/questions I can help out with? Thank you.

## 2022-01-03 NOTE — Telephone Encounter (Signed)
Called pt to inform her Dr. Stanford Breed said she can take the booster shot. She verbalized understanding. No further questions at this time.

## 2022-01-03 NOTE — Telephone Encounter (Signed)
Inbound call from Linda Parker stating this morning she woke up with swelling in her ankles and feet. Patient states she has been wearing compression socks for the last two days and the swelling still has not went down. Patient is inquiring if she should take a prilosec medication before meals or after. Patient is also inquiring if she should only take the prilosec medication for two weeks. Patient also states that her back was hurting over herthe weekend .Patients states she will be going to the nursing home later today to have lunch with her husband so if you cannot reach her then to please leave a message.  Thank you

## 2022-01-03 NOTE — Telephone Encounter (Signed)
Spoke with pt and gave pt recommendations. Pt verbalized understanding and had no other concerns at end of call.

## 2022-01-03 NOTE — Telephone Encounter (Signed)
Pt would like to know if its okay for her to have covid booster shot. Pt asked to speak with Luisa Dago RN.

## 2022-01-03 NOTE — Telephone Encounter (Signed)
Plan is to take the Prilosec 40 mg twice daily for the next 4 weeks, then reduce to 40 mg daily for 1 week, then every other day for 1 week to reduce risk of rebound hyper acid secretion.  Since no underlying reflux, should be able to quickly titrate off the omeprazole completely by 6 weeks.  Agree with recommendations on timing of Prilosec.  Otherwise, completely agree that she should contact her PCM or Cardiologist regarding the swelling.

## 2022-01-03 NOTE — Telephone Encounter (Addendum)
Pt concerned that her feet and ankles have been swelling more lately. Pt states she wore her compression socks all day yesterday but when she took them off she still noticed some swelling. Let pt know she could follow up with her PCP or cardiologist for swollen feet and ankles. Pt stated she always has swelling in her right leg since they took a vein out of her leg for bypass surgery but she never has swelling in her left leg and she felt like this could be related to starting prilosec. Pt also reported yesterday she had back pain over her kidney. Pt took an ibuprofen and pain resolved. Pt was concerned that this could have been related to prilosec as well. Pt also wanted to know how to take prilosec. Let pt know she should take prilosec 30-60 min before a meal and she will be taking it twice a day for 4 weeks and then she will take it once a day and titrate off if no further upper GI symptoms. Pt wanted to know how long she will need to take Prilosec daily before she stops.

## 2022-01-04 ENCOUNTER — Ambulatory Visit: Payer: Medicare HMO | Admitting: Gastroenterology

## 2022-01-05 ENCOUNTER — Telehealth: Payer: Self-pay | Admitting: Gastroenterology

## 2022-01-05 ENCOUNTER — Other Ambulatory Visit: Payer: Self-pay

## 2022-01-05 MED ORDER — BUDESONIDE 3 MG PO CPEP: ORAL_CAPSULE | ORAL | 0 refills | Status: AC

## 2022-01-05 NOTE — Telephone Encounter (Signed)
Patient called, states she would like to speak to a nurse. Please call to advise

## 2022-01-05 NOTE — Telephone Encounter (Addendum)
error 

## 2022-01-05 NOTE — Telephone Encounter (Signed)
Left message for pt to call back  °

## 2022-01-06 ENCOUNTER — Telehealth: Payer: Self-pay | Admitting: Gastroenterology

## 2022-01-06 NOTE — Telephone Encounter (Signed)
Pt had a questions about what Dr. Bryan Lemma said about the imodium. Let pt know that if the budesonide was not helping she could add imodium prn in addition to budesonide. Pt verbalized understanding. Pt also mentioned that she was unable to pick up budesonide because her insurance required a prior auth.

## 2022-01-06 NOTE — Telephone Encounter (Signed)
Inbound call from patient requesting a call back to discuss which rx she should be taking. She also states she needs a prior auth for budesonide. Please advise.

## 2022-01-06 NOTE — Telephone Encounter (Signed)
Spoke with pt on 9/13. See 9/7 surgical pathology result note.

## 2022-01-10 DIAGNOSIS — E7800 Pure hypercholesterolemia, unspecified: Secondary | ICD-10-CM | POA: Diagnosis not present

## 2022-01-10 DIAGNOSIS — E039 Hypothyroidism, unspecified: Secondary | ICD-10-CM | POA: Diagnosis not present

## 2022-01-10 DIAGNOSIS — R5383 Other fatigue: Secondary | ICD-10-CM | POA: Diagnosis not present

## 2022-01-10 DIAGNOSIS — R7989 Other specified abnormal findings of blood chemistry: Secondary | ICD-10-CM | POA: Diagnosis not present

## 2022-01-11 ENCOUNTER — Telehealth: Payer: Self-pay

## 2022-01-11 NOTE — Telephone Encounter (Signed)
Patient Advocate Encounter   Received notification from [facility] that prior authorization is required for Budesonide '3MG'$  dr capsules  Submitted: 01-11-2022 Key [id number]

## 2022-01-12 ENCOUNTER — Telehealth: Payer: Self-pay

## 2022-01-12 ENCOUNTER — Telehealth: Payer: Self-pay | Admitting: Cardiology

## 2022-01-12 ENCOUNTER — Other Ambulatory Visit (HOSPITAL_COMMUNITY): Payer: Self-pay

## 2022-01-12 NOTE — Telephone Encounter (Signed)
Spoke to patient, patient states she found her medication, nothing further needed.

## 2022-01-12 NOTE — Telephone Encounter (Signed)
Pt c/o medication issue:  1. Name of Medication: metoprolol tartrate (LOPRESSOR) 25 MG tablet  2. How are you currently taking this medication (dosage and times per day)? 1 tablet daily at night   3. Are you having a reaction (difficulty breathing--STAT)? No   4. What is your medication issue? Patient is calling stating she has been out of this medication for two days, but was advised her insurance will not cover a new refill until November. Please advise.

## 2022-01-12 NOTE — Telephone Encounter (Signed)
Received notification from [employer] regarding a prior authorization for Budesonide. Authorization has been APPROVED from 04/25/21 to 04/24/22.     PA Case ID: [id number]

## 2022-01-13 DIAGNOSIS — H40013 Open angle with borderline findings, low risk, bilateral: Secondary | ICD-10-CM | POA: Diagnosis not present

## 2022-01-13 NOTE — Telephone Encounter (Signed)
Patient Advocate Encounter  Prior Authorization for Budesonide '3MG'$  has been approved.   Effective: 04/25/2021 to 04/24/2022  Clista Bernhardt, CPhT Rx Patient Advocate Phone: [phone]

## 2022-01-17 DIAGNOSIS — I350 Nonrheumatic aortic (valve) stenosis: Secondary | ICD-10-CM | POA: Diagnosis not present

## 2022-01-17 DIAGNOSIS — I25810 Atherosclerosis of coronary artery bypass graft(s) without angina pectoris: Secondary | ICD-10-CM | POA: Diagnosis not present

## 2022-01-17 DIAGNOSIS — E7800 Pure hypercholesterolemia, unspecified: Secondary | ICD-10-CM | POA: Diagnosis not present

## 2022-01-17 DIAGNOSIS — R82998 Other abnormal findings in urine: Secondary | ICD-10-CM | POA: Diagnosis not present

## 2022-01-17 DIAGNOSIS — D692 Other nonthrombocytopenic purpura: Secondary | ICD-10-CM | POA: Diagnosis not present

## 2022-01-17 DIAGNOSIS — I119 Hypertensive heart disease without heart failure: Secondary | ICD-10-CM | POA: Diagnosis not present

## 2022-01-17 DIAGNOSIS — M8580 Other specified disorders of bone density and structure, unspecified site: Secondary | ICD-10-CM | POA: Diagnosis not present

## 2022-01-17 DIAGNOSIS — E039 Hypothyroidism, unspecified: Secondary | ICD-10-CM | POA: Diagnosis not present

## 2022-01-17 DIAGNOSIS — M1990 Unspecified osteoarthritis, unspecified site: Secondary | ICD-10-CM | POA: Diagnosis not present

## 2022-01-17 DIAGNOSIS — Z1339 Encounter for screening examination for other mental health and behavioral disorders: Secondary | ICD-10-CM | POA: Diagnosis not present

## 2022-01-17 DIAGNOSIS — Z1331 Encounter for screening for depression: Secondary | ICD-10-CM | POA: Diagnosis not present

## 2022-01-17 DIAGNOSIS — Z0000 Encounter for general adult medical examination without abnormal findings: Secondary | ICD-10-CM | POA: Diagnosis not present

## 2022-01-17 DIAGNOSIS — Z952 Presence of prosthetic heart valve: Secondary | ICD-10-CM | POA: Diagnosis not present

## 2022-01-19 ENCOUNTER — Ambulatory Visit: Payer: Medicare HMO | Admitting: Gastroenterology

## 2022-01-19 NOTE — Telephone Encounter (Signed)
Spoke with pt and she states she has started on the budesonide and the diarrhea is gone. She is doing much better.

## 2022-01-24 ENCOUNTER — Telehealth: Payer: Self-pay | Admitting: Gastroenterology

## 2022-01-24 NOTE — Telephone Encounter (Addendum)
Let pt know that she can titrate miralax and take 1-3 capfuls per day to help with constipation. Also encouraged pt to drink plenty of water. Pt verbalized understanding.

## 2022-01-24 NOTE — Telephone Encounter (Signed)
Inbound called from patient states she was taking "Budesonide" and she is now constipate.Want to know if she can drink Miralax twice at day.Please advise

## 2022-02-25 DIAGNOSIS — S0990XA Unspecified injury of head, initial encounter: Secondary | ICD-10-CM | POA: Diagnosis not present

## 2022-02-25 DIAGNOSIS — S61511A Laceration without foreign body of right wrist, initial encounter: Secondary | ICD-10-CM | POA: Diagnosis not present

## 2022-02-25 DIAGNOSIS — S0231XA Fracture of orbital floor, right side, initial encounter for closed fracture: Secondary | ICD-10-CM | POA: Diagnosis not present

## 2022-02-25 DIAGNOSIS — E039 Hypothyroidism, unspecified: Secondary | ICD-10-CM | POA: Diagnosis not present

## 2022-02-25 DIAGNOSIS — S01111A Laceration without foreign body of right eyelid and periocular area, initial encounter: Secondary | ICD-10-CM | POA: Diagnosis not present

## 2022-02-25 DIAGNOSIS — Z951 Presence of aortocoronary bypass graft: Secondary | ICD-10-CM | POA: Diagnosis not present

## 2022-02-25 DIAGNOSIS — W19XXXA Unspecified fall, initial encounter: Secondary | ICD-10-CM | POA: Diagnosis not present

## 2022-02-25 DIAGNOSIS — S6991XA Unspecified injury of right wrist, hand and finger(s), initial encounter: Secondary | ICD-10-CM | POA: Diagnosis not present

## 2022-02-25 DIAGNOSIS — I2510 Atherosclerotic heart disease of native coronary artery without angina pectoris: Secondary | ICD-10-CM | POA: Diagnosis not present

## 2022-02-25 DIAGNOSIS — Z79899 Other long term (current) drug therapy: Secondary | ICD-10-CM | POA: Diagnosis not present

## 2022-02-25 DIAGNOSIS — Z79890 Hormone replacement therapy (postmenopausal): Secondary | ICD-10-CM | POA: Diagnosis not present

## 2022-02-25 DIAGNOSIS — S61411A Laceration without foreign body of right hand, initial encounter: Secondary | ICD-10-CM | POA: Diagnosis not present

## 2022-02-25 DIAGNOSIS — S0083XA Contusion of other part of head, initial encounter: Secondary | ICD-10-CM | POA: Diagnosis not present

## 2022-02-28 NOTE — Progress Notes (Signed)
HPI: FU AVR and CAD. Echocardiogram in May 2015 demonstrated severe aortic stenosis. She was symptomatic and referred for cardiac catheterization. This demonstrated 3 vessel CAD. She was referred for CABG and AVR. She was admitted 6/15 and underwent CABG (LIMA-LAD, SVG-D1, SVG-D2, SVG-OM1) and bioprosthetic AVR (23 mm Edwards magna-ease pericardial valve). Note preoperative carotid Dopplers May 2015 showed bilateral 1-39% stenosis. Echocardiogram August 2022 showed normal LV function, mild left ventricular hypertrophy, grade 1 diastolic dysfunction, biatrial enlargement, mild mitral vegetation, moderate tricuspid regurgitation, previous aortic valve replacement with no aortic insufficiency and mean gradient 10 mmHg.  CTA October 2022 showed coronary and aortic atherosclerosis and borderline dilatation of the aortic arch at 3.1 cm.  Since last seen, she denies dyspnea, chest pain or syncope.  She recently lost her husband and is appropriately grieving.  She has some unsteadiness and weight loss.  She fell recently with trauma to her right face.  Current Outpatient Medications  Medication Sig Dispense Refill   acetaminophen (TYLENOL) 325 MG tablet Take 650 mg by mouth every 6 (six) hours as needed.     acyclovir (ZOVIRAX) 400 MG tablet Take 400 mg by mouth 5 (five) times daily. As needed     Ascorbic Acid (VITAMIN C) POWD Take by mouth. Taking 1/4 teaspoon daily     aspirin 81 MG tablet Take 81 mg by mouth daily.     budesonide (ENTOCORT EC) 3 MG 24 hr capsule Take 3 capsules (9 mg total) by mouth daily for 56 days, THEN 2 capsules (6 mg total) daily for 14 days, THEN 1 capsule (3 mg total) daily for 14 days. 210 capsule 0   cephALEXin (KEFLEX) 500 MG capsule Take 500 mg by mouth 3 (three) times daily.     cholecalciferol (VITAMIN D) 1000 UNITS tablet Take 2,000 Units by mouth daily.      CINNAMON PO Take by mouth. Takes 1/4 teaspoon daily     estradiol (ESTRACE) 0.5 MG tablet Take 0.5 mg by mouth  daily before breakfast.      fluticasone (FLONASE) 50 MCG/ACT nasal spray Place 1 spray into both nostrils as needed.     Glucosamine-Chondroitin (GLUCOSAMINE CHONDR COMPLEX PO) Take 1 tablet by mouth 2 (two) times daily.     levothyroxine (SYNTHROID, LEVOTHROID) 50 MCG tablet Take 50 mcg by mouth daily.      lovastatin (MEVACOR) 20 MG tablet TAKE 1 TABLET AT BEDTIME 90 tablet 10   metoprolol tartrate (LOPRESSOR) 25 MG tablet Take 0.5 tablets (12.5 mg total) by mouth 2 (two) times daily. 90 tablet 3   omega-3 acid ethyl esters (LOVAZA) 1 G capsule Take 1 g by mouth 2 (two) times daily.     traMADol (ULTRAM) 50 MG tablet Take 1 tablet (50 mg total) by mouth every 6 (six) hours as needed. 20 tablet 0   vitamin E 400 UNIT capsule Take 400 Units by mouth daily.     VITAMIN K PO Take by mouth.     Cyanocobalamin (VITAMIN B-12 PO) Take by mouth as directed. (Patient not taking: Reported on 03/14/2022)     diphenoxylate-atropine (LOMOTIL) 2.5-0.025 MG tablet Take 1 tablet by mouth 4 (four) times daily as needed for diarrhea or loose stools. (Patient not taking: Reported on 03/14/2022) 423 tablet 1   folic acid (FOLVITE) 536 MCG tablet Take 400 mcg by mouth daily. (Patient not taking: Reported on 03/14/2022)     hydrocortisone 2.5 % ointment Apply 1 Application topically as needed. (Patient not taking:  Reported on 03/14/2022)     levothyroxine (SYNTHROID) 25 MCG tablet Take by mouth.     methocarbamol (ROBAXIN) 500 MG tablet Take 1 tablet by mouth as needed. (Patient not taking: Reported on 03/14/2022)     omeprazole (PRILOSEC) 40 MG capsule Take 1 capsule (40 mg total) by mouth in the morning and at bedtime. Take 40 mg twice a day for 4 weeks (Patient not taking: Reported on 03/14/2022) 90 capsule 3   sertraline (ZOLOFT) 50 MG tablet Take 50 mg by mouth daily. (Patient not taking: Reported on 03/14/2022)     Current Facility-Administered Medications  Medication Dose Route Frequency Provider Last Rate  Last Admin   0.9 %  sodium chloride infusion  500 mL Intravenous Once Cirigliano, Vito V, DO         Past Medical History:  Diagnosis Date   Allergy    Anxiety    Aortic stenosis    s/p AVR w/ 23 mm Edward Magna-ease Pericardial Valve   Arthritis    hands & knee- L    Coronary artery disease    Depression    Dysrhythmia    when taking antihistamines   Heart murmur    History of echocardiogram    Echo (8/15): EF 55-60%, normal wall motion, grade 1 diastolic dysfunction, AVR okay (mean 10 mm Hg), normal RV function, moderate TR   Osteoporosis    Palpitations    Unspecified hypothyroidism    Vertigo     Past Surgical History:  Procedure Laterality Date   ABDOMINAL HYSTERECTOMY     AORTIC VALVE REPLACEMENT N/A 09/24/2013   Procedure: AORTIC VALVE REPLACEMENT (AVR) using Pericardial Tissue Valve (size 51m).;  Surgeon: BGaye Pollack MD;  Location: [facility];  Service: Open Heart Surgery;  Laterality: N/A;   APPENDECTOMY     at time of hysterectomy   bilateral knee surgery  35 yrs. ago   cartilage removed from knees   CWilkinsonGRAFT N/A 09/24/2013   Procedure: CORONARY ARTERY BYPASS GRAFTING (CABG) x4: LIMA to LAD, SVG to Diagonal 1, SVG to Diagonal 2, Svg to OM 1.;  Surgeon: BGaye Pollack MD;  Location: [facility];  Service: Open Heart Surgery;  Laterality: N/A;   FINGER SURGERY     left hand   INJECTION KNEE Left 03/04/2013   Procedure: left KNEE cortisone INJECTION;  Surgeon: FGearlean Alf MD;  Location: [facility];  Service: Orthopedics;  Laterality: Left;   INTRAOPERATIVE TRANSESOPHAGEAL ECHOCARDIOGRAM N/A 09/24/2013   Procedure: INTRAOPERATIVE TRANSESOPHAGEAL ECHOCARDIOGRAM;  Surgeon: BGaye Pollack MD;  [facility];  Service: Open Heart Surgery;  Laterality: N/A;   LEFT AND RIGHT HEART CATHETERIZATION WITH CORONARY ANGIOGRAM N/A 09/17/2013   Procedure: LEFT AND RIGHT HEART CATHETERIZATION WITH CORONARY  ANGIOGRAM;  Surgeon: MBlane Ohara MD;  [facility];  Service: Cardiovascular;  Laterality: N/A;   MASS EXCISION Right 05/17/2018   Procedure: EXCISION CYST RIGHT RING FINGER, DEBRIDEMENT DISTAL INTERPHALANGEAL RIGHT FINGER;  Surgeon: KDaryll Brod MD;  [place] [facility]  Service: [place]opedics;  Laterality: Right;   MASS EXCISION Right 07/14/2020   Procedure: EXCISION CYST, DEBRIDEMENT DISTAL INTERPHALANGEAL JOINT RIGHT INDEX FINGER;  Surgeon: KDaryll Brod MD;  [place] [facility]  Service: Orthopedics;  Laterality: Right;  IV REGIONAL FOREARM BLOCK; 60 MINUTES   TONSILLECTOMY     TOTAL KNEE ARTHROPLASTY Right 03/04/2013   Procedure: RIGHT  TOTAL KNEE ARTHROPLASTY;  Surgeon: Gearlean Alf, MD;  Location: [facility];  Service: Orthopedics;  Laterality: Right;    Social History   Socioeconomic History   Marital status: Married    Spouse name: Not on file   Number of children: 3   Years of education: Not on file   Highest education level: Not on file  Occupational History   Occupation: retired  Tobacco Use   Smoking status: Never   Smokeless tobacco: Never  Vaping Use   Vaping Use: Never used  Substance and Sexual Activity   Alcohol use: Yes    Alcohol/week: 1.0 standard drink of alcohol    Types: 1 Glasses of wine per week    Comment: 4 oz nightly red wine   Drug use: No   Sexual activity: Not Currently    Birth control/protection: Post-menopausal, Surgical  Other Topics Concern   Not on file  Social History Narrative   Not on file   Social Determinants of Health   Financial Resource Strain: Not on file  Food Insecurity: Not on file  Transportation Needs: Not on file  Physical Activity: Not on file  Stress: Not on file  Social Connections: Not on file  Intimate Partner Violence: Not on file    Family History  Problem Relation Age of Onset   Heart disease Mother    Heart failure Mother    Esophageal cancer Father    Heart  disease Father    Heart disease Sister    Hyperlipidemia Sister    Heart disease Sister    Heart disease Brother    ALS Brother     ROS: no fevers or chills, productive cough, hemoptysis, dysphasia, odynophagia, melena, hematochezia, dysuria, hematuria, rash, seizure activity, orthopnea, PND, pedal edema, claudication. Remaining systems are negative.  Physical Exam: Well-developed well-nourished in no acute distress.  Skin is warm and dry.  HEENT with ecchymosis following recent fall Neck is supple.  Chest is clear to auscultation with normal expansion.  3/6 systolic murmur left sternal border. Cardiovascular exam is regular rate and rhythm.  Abdominal exam nontender or distended. No masses palpated. Extremities show no edema. neuro grossly intactd  A/P  1 coronary artery disease status post coronary artery bypass graft-patient denies chest pain.  Continue medical therapy with aspirin and statin.  2 aortic valve replacement-continue SBE prophylaxis.  3 hyperlipidemia-continue statin.  4 palpitations-symptoms are controlled at present.  We can consider addition of beta-blockade in the future if necessary.  [name], MD

## 2022-03-04 DIAGNOSIS — Z4800 Encounter for change or removal of nonsurgical wound dressing: Secondary | ICD-10-CM | POA: Diagnosis not present

## 2022-03-04 DIAGNOSIS — S0181XA Laceration without foreign body of other part of head, initial encounter: Secondary | ICD-10-CM | POA: Diagnosis not present

## 2022-03-04 DIAGNOSIS — S61411A Laceration without foreign body of right hand, initial encounter: Secondary | ICD-10-CM | POA: Diagnosis not present

## 2022-03-07 ENCOUNTER — Other Ambulatory Visit: Payer: Self-pay | Admitting: Cardiology

## 2022-03-07 DIAGNOSIS — Z4802 Encounter for removal of sutures: Secondary | ICD-10-CM | POA: Diagnosis not present

## 2022-03-07 DIAGNOSIS — S0285XA Fracture of orbit, unspecified, initial encounter for closed fracture: Secondary | ICD-10-CM | POA: Diagnosis not present

## 2022-03-07 DIAGNOSIS — W010XXA Fall on same level from slipping, tripping and stumbling without subsequent striking against object, initial encounter: Secondary | ICD-10-CM | POA: Diagnosis not present

## 2022-03-07 DIAGNOSIS — K589 Irritable bowel syndrome without diarrhea: Secondary | ICD-10-CM | POA: Diagnosis not present

## 2022-03-07 DIAGNOSIS — S61411A Laceration without foreign body of right hand, initial encounter: Secondary | ICD-10-CM | POA: Diagnosis not present

## 2022-03-07 DIAGNOSIS — S01111A Laceration without foreign body of right eyelid and periocular area, initial encounter: Secondary | ICD-10-CM | POA: Diagnosis not present

## 2022-03-07 DIAGNOSIS — Z658 Other specified problems related to psychosocial circumstances: Secondary | ICD-10-CM | POA: Diagnosis not present

## 2022-03-14 ENCOUNTER — Encounter: Payer: Self-pay | Admitting: Cardiology

## 2022-03-14 ENCOUNTER — Ambulatory Visit: Payer: Medicare HMO | Attending: Cardiology | Admitting: Cardiology

## 2022-03-14 VITALS — BP 114/50 | HR 94 | Ht 63.75 in | Wt 113.2 lb

## 2022-03-14 DIAGNOSIS — I2510 Atherosclerotic heart disease of native coronary artery without angina pectoris: Secondary | ICD-10-CM | POA: Diagnosis not present

## 2022-03-14 DIAGNOSIS — E7800 Pure hypercholesterolemia, unspecified: Secondary | ICD-10-CM

## 2022-03-14 DIAGNOSIS — Z953 Presence of xenogenic heart valve: Secondary | ICD-10-CM

## 2022-03-14 DIAGNOSIS — I2583 Coronary atherosclerosis due to lipid rich plaque: Secondary | ICD-10-CM

## 2022-03-14 DIAGNOSIS — R002 Palpitations: Secondary | ICD-10-CM

## 2022-03-14 NOTE — Patient Instructions (Signed)
  Follow-Up: At [facility], you and your health needs are our priority.  As part of our continuing mission to provide you with exceptional heart care, we have created designated Provider Care Teams.  These Care Teams include your primary Cardiologist (physician) and Advanced Practice Providers (APPs -  Physician Assistants and Nurse Practitioners) who all work together to provide you with the care you need, when you need it.  We recommend signing up for the patient portal called "MyChart".  Sign up information is provided on this After Visit Summary.  MyChart is used to connect with patients for Virtual Visits (Telemedicine).  Patients are able to view lab/test results, encounter notes, upcoming appointments, etc.  Non-urgent messages can be sent to your provider as well.   To learn more about what you can do with MyChart, go to [email or web address].    Your next appointment:   6 month(s)  The format for your next appointment:   In Person  Provider:   Brian Crenshaw, MD    

## 2022-04-05 DIAGNOSIS — Z1231 Encounter for screening mammogram for malignant neoplasm of breast: Secondary | ICD-10-CM | POA: Diagnosis not present

## 2022-05-11 DIAGNOSIS — H2513 Age-related nuclear cataract, bilateral: Secondary | ICD-10-CM | POA: Diagnosis not present

## 2022-05-11 DIAGNOSIS — H25043 Posterior subcapsular polar age-related cataract, bilateral: Secondary | ICD-10-CM | POA: Diagnosis not present

## 2022-05-11 DIAGNOSIS — H52203 Unspecified astigmatism, bilateral: Secondary | ICD-10-CM | POA: Diagnosis not present

## 2022-05-17 DIAGNOSIS — L570 Actinic keratosis: Secondary | ICD-10-CM | POA: Diagnosis not present

## 2022-05-17 DIAGNOSIS — L820 Inflamed seborrheic keratosis: Secondary | ICD-10-CM | POA: Diagnosis not present

## 2022-05-17 DIAGNOSIS — Z85828 Personal history of other malignant neoplasm of skin: Secondary | ICD-10-CM | POA: Diagnosis not present

## 2022-06-14 DIAGNOSIS — H25811 Combined forms of age-related cataract, right eye: Secondary | ICD-10-CM | POA: Diagnosis not present

## 2022-06-14 DIAGNOSIS — H25041 Posterior subcapsular polar age-related cataract, right eye: Secondary | ICD-10-CM | POA: Diagnosis not present

## 2022-06-14 DIAGNOSIS — Z961 Presence of intraocular lens: Secondary | ICD-10-CM | POA: Diagnosis not present

## 2022-06-14 DIAGNOSIS — H2511 Age-related nuclear cataract, right eye: Secondary | ICD-10-CM | POA: Diagnosis not present

## 2022-06-14 DIAGNOSIS — H269 Unspecified cataract: Secondary | ICD-10-CM | POA: Diagnosis not present

## 2022-06-28 DIAGNOSIS — H2512 Age-related nuclear cataract, left eye: Secondary | ICD-10-CM | POA: Diagnosis not present

## 2022-06-28 DIAGNOSIS — H269 Unspecified cataract: Secondary | ICD-10-CM | POA: Diagnosis not present

## 2022-06-28 DIAGNOSIS — H25042 Posterior subcapsular polar age-related cataract, left eye: Secondary | ICD-10-CM | POA: Diagnosis not present

## 2022-06-28 DIAGNOSIS — H25812 Combined forms of age-related cataract, left eye: Secondary | ICD-10-CM | POA: Diagnosis not present

## 2022-06-28 DIAGNOSIS — Z961 Presence of intraocular lens: Secondary | ICD-10-CM | POA: Diagnosis not present

## 2022-07-28 DIAGNOSIS — H524 Presbyopia: Secondary | ICD-10-CM | POA: Diagnosis not present

## 2022-08-17 DIAGNOSIS — J309 Allergic rhinitis, unspecified: Secondary | ICD-10-CM | POA: Diagnosis not present

## 2022-08-17 DIAGNOSIS — Z952 Presence of prosthetic heart valve: Secondary | ICD-10-CM | POA: Diagnosis not present

## 2022-08-17 DIAGNOSIS — R058 Other specified cough: Secondary | ICD-10-CM | POA: Diagnosis not present

## 2022-08-17 DIAGNOSIS — J069 Acute upper respiratory infection, unspecified: Secondary | ICD-10-CM | POA: Diagnosis not present

## 2022-09-15 NOTE — Progress Notes (Signed)
HPI:FU AVR and CAD. Echocardiogram in May 2015 demonstrated severe aortic stenosis. She was symptomatic and referred for cardiac catheterization. This demonstrated 3 vessel CAD. She was referred for CABG and AVR. She was admitted 6/15 and underwent CABG (LIMA-LAD, SVG-D1, SVG-D2, SVG-OM1) and bioprosthetic AVR (23 mm Edwards magna-ease pericardial valve). Note preoperative carotid Dopplers May 2015 showed bilateral 1-39% stenosis. Echocardiogram August 2022 showed normal LV function, mild left ventricular hypertrophy, grade 1 diastolic dysfunction, biatrial enlargement, mild mitral vegetation, moderate tricuspid regurgitation, previous aortic valve replacement with no aortic insufficiency and mean gradient 10 mmHg.  CTA October 2022 showed coronary and aortic atherosclerosis and borderline dilatation of the aortic arch at 3.1 cm.  Since last seen, the patient has dyspnea with more extreme activities but not with routine activities. It is relieved with rest. It is not associated with chest pain. There is no orthopnea, PND or pedal edema. There is no syncope or palpitations. There is no exertional chest pain.   Current Outpatient Medications  Medication Sig Dispense Refill   aspirin 81 MG tablet Take 81 mg by mouth daily.     cholecalciferol (VITAMIN D) 1000 UNITS tablet Take 2,000 Units by mouth daily.      CINNAMON PO Take by mouth. Takes 1/4 teaspoon daily     estradiol (ESTRACE) 0.5 MG tablet Take 0.5 mg by mouth daily before breakfast.      fluticasone (FLONASE) 50 MCG/ACT nasal spray Place 1 spray into both nostrils as needed.     Glucosamine-Chondroitin (GLUCOSAMINE CHONDR COMPLEX PO) Take 1 tablet by mouth 2 (two) times daily.     hydrocortisone 2.5 % ointment Apply 1 Application topically as needed.     levothyroxine (SYNTHROID) 25 MCG tablet Take by mouth.     levothyroxine (SYNTHROID, LEVOTHROID) 50 MCG tablet Take 50 mcg by mouth daily.      lovastatin (MEVACOR) 20 MG tablet TAKE 1  TABLET AT BEDTIME 90 tablet 10   Menaquinone-7 40 MCG TABS Take by mouth.     metoprolol tartrate (LOPRESSOR) 25 MG tablet Take 0.5 tablets (12.5 mg total) by mouth 2 (two) times daily. 90 tablet 3   omega-3 acid ethyl esters (LOVAZA) 1 G capsule Take 1 g by mouth 2 (two) times daily.     vitamin E 400 UNIT capsule Take 400 Units by mouth daily.     VITAMIN K PO Take by mouth.     acetaminophen (TYLENOL) 325 MG tablet Take 650 mg by mouth every 6 (six) hours as needed. (Patient not taking: Reported on 09/22/2022)     acyclovir (ZOVIRAX) 400 MG tablet Take 400 mg by mouth 5 (five) times daily. As needed (Patient not taking: Reported on 09/22/2022)     Ascorbic Acid (VITAMIN C) POWD Take by mouth. Taking 1/4 teaspoon daily (Patient not taking: Reported on 09/22/2022)     cephALEXin (KEFLEX) 500 MG capsule Take 500 mg by mouth 3 (three) times daily. (Patient not taking: Reported on 09/22/2022)     Cyanocobalamin (VITAMIN B-12 PO) Take by mouth as directed. (Patient not taking: Reported on 09/22/2022)     diphenoxylate-atropine (LOMOTIL) 2.5-0.025 MG tablet Take 1 tablet by mouth 4 (four) times daily as needed for diarrhea or loose stools. (Patient not taking: Reported on 09/22/2022) 100 tablet 1   folic acid (FOLVITE) 800 MCG tablet Take 400 mcg by mouth daily. (Patient not taking: Reported on 09/22/2022)     methocarbamol (ROBAXIN) 500 MG tablet Take 1 tablet by mouth as  needed. (Patient not taking: Reported on 09/22/2022)     omeprazole (PRILOSEC) 40 MG capsule Take 1 capsule (40 mg total) by mouth in the morning and at bedtime. Take 40 mg twice a day for 4 weeks (Patient not taking: Reported on 09/22/2022) 90 capsule 3   sertraline (ZOLOFT) 50 MG tablet Take 50 mg by mouth daily. (Patient not taking: Reported on 09/22/2022)     traMADol (ULTRAM) 50 MG tablet Take 1 tablet (50 mg total) by mouth every 6 (six) hours as needed. (Patient not taking: Reported on 09/22/2022) 20 tablet 0   Current  Facility-Administered Medications  Medication Dose Route Frequency Provider Last Rate Last Admin   0.9 %  sodium chloride infusion  500 mL Intravenous Once Cirigliano, Vito V, DO         Past Medical History:  Diagnosis Date   Allergy    Anxiety    Aortic stenosis    s/p AVR w/ 23 mm Edward Magna-ease Pericardial Valve   Arthritis    hands & knee- L    Coronary artery disease    Depression    Dysrhythmia    when taking antihistamines   Heart murmur    History of echocardiogram    Echo (8/15): EF 55-60%, normal wall motion, grade 1 diastolic dysfunction, AVR okay (mean 10 mm Hg), normal RV function, moderate TR   Osteoporosis    Palpitations    Unspecified hypothyroidism    Vertigo     Past Surgical History:  Procedure Laterality Date   ABDOMINAL HYSTERECTOMY     AORTIC VALVE REPLACEMENT N/A 09/24/2013   Procedure: AORTIC VALVE REPLACEMENT (AVR) using Pericardial Tissue Valve (size 23mm).;  Surgeon: Alleen Borne, MD;  Location: [facility];  Service: Open Heart Surgery;  Laterality: N/A;   APPENDECTOMY     at time of hysterectomy   bilateral knee surgery  35 yrs. ago   cartilage removed from knees   CARDIAC CATHETERIZATION     CARDIAC VALVE REPLACEMENT     CORONARY ARTERY BYPASS GRAFT N/A 09/24/2013   Procedure: CORONARY ARTERY BYPASS GRAFTING (CABG) x4: LIMA to LAD, SVG to Diagonal 1, SVG to Diagonal 2, Svg to OM 1.;  Surgeon: Alleen Borne, MD;  Location: [facility];  Service: Open Heart Surgery;  Laterality: N/A;   FINGER SURGERY     left hand   INJECTION KNEE Left 03/04/2013   Procedure: left KNEE cortisone INJECTION;  Surgeon: Loanne Drilling, MD;  Location: [facility];  Service: Orthopedics;  Laterality: Left;   INTRAOPERATIVE TRANSESOPHAGEAL ECHOCARDIOGRAM N/A 09/24/2013   Procedure: INTRAOPERATIVE TRANSESOPHAGEAL ECHOCARDIOGRAM;  Surgeon: Alleen Borne, MD;  [facility];  Service: Open Heart Surgery;  Laterality: N/A;   LEFT AND RIGHT HEART CATHETERIZATION WITH CORONARY  ANGIOGRAM N/A 09/17/2013   Procedure: LEFT AND RIGHT HEART CATHETERIZATION WITH CORONARY ANGIOGRAM;  Surgeon: Micheline Chapman, MD;  Location: [facility] LAB;  Service: Cardiovascular;  Laterality: N/A;   MASS EXCISION Right 05/17/2018   Procedure: EXCISION CYST RIGHT RING FINGER, DEBRIDEMENT DISTAL INTERPHALANGEAL RIGHT FINGER;  Surgeon: Cindee Salt, MD;  Location: [facility];  Service: Orthopedics;  Laterality: Right;   MASS EXCISION Right 07/14/2020   Procedure: EXCISION CYST, DEBRIDEMENT DISTAL INTERPHALANGEAL JOINT RIGHT INDEX FINGER;  Surgeon: Cindee Salt, MD;  Location: [facility];  Service: Orthopedics;  Laterality: Right;  IV REGIONAL FOREARM BLOCK; 60 MINUTES   TONSILLECTOMY     TOTAL KNEE ARTHROPLASTY Right 03/04/2013   Procedure: RIGHT  TOTAL KNEE ARTHROPLASTY;  Surgeon: Loanne Drilling, MD;  Location: [facility];  Service: Orthopedics;  Laterality: Right;    Social History   Socioeconomic History   Marital status: Married    Spouse name: Not on file   Number of children: 3   Years of education: Not on file   Highest education level: Not on file  Occupational History   Occupation: retired  Tobacco Use   Smoking status: Never   Smokeless tobacco: Never  Vaping Use   Vaping Use: Never used  Substance and Sexual Activity   Alcohol use: Yes    Alcohol/week: 1.0 standard drink of alcohol    Types: 1 Glasses of wine per week    Comment: 4 oz nightly red wine   Drug use: No   Sexual activity: Not Currently    Birth control/protection: Post-menopausal, Surgical  Other Topics Concern   Not on file  Social History Narrative   Not on file   Social Determinants of Health   Financial Resource Strain: Not on file  Food Insecurity: Not on file  Transportation Needs: Not on file  Physical Activity: Not on file  Stress: Not on file  Social Connections: Not on file  Intimate Partner Violence: Not on file    Family History  Problem Relation Age of Onset    Heart disease Mother    Heart failure Mother    Esophageal cancer Father    Heart disease Father    Heart disease Sister    Hyperlipidemia Sister    Heart disease Sister    Heart disease Brother    ALS Brother     ROS: no fevers or chills, productive cough, hemoptysis, dysphasia, odynophagia, melena, hematochezia, dysuria, hematuria, rash, seizure activity, orthopnea, PND, pedal edema, claudication. Remaining systems are negative.  Physical Exam: Well-developed well-nourished in no acute distress.  Skin is warm and dry.  HEENT is normal.  Neck is supple.  Chest is clear to auscultation with normal expansion.  Cardiovascular exam is regular rate and rhythm.  2/6 systolic murmur left sternal border.  No diastolic murmur. Abdominal exam nontender or distended. No masses palpated. Extremities show no edema. neuro grossly intact  ECG-normal sinus rhythm at a rate of 71, left axis deviation, no ST changes.  Personally reviewed  A/P  1 coronary artery disease status post coronary artery bypass graft-patient doing well with no chest pain.  Continue aspirin and statin.  2 status post aortic valve replacement-continue SBE prophylaxis.  3 hyperlipidemia-continue statin.  4 palpitations-symptoms are reasonly well-controlled.  Will consider addition of beta-blockade in the future if she has worsening symptoms.  Olga Millers, MD

## 2022-09-22 ENCOUNTER — Encounter: Payer: Self-pay | Admitting: Cardiology

## 2022-09-22 ENCOUNTER — Ambulatory Visit: Payer: Medicare HMO | Attending: Cardiology | Admitting: Cardiology

## 2022-09-22 VITALS — BP 128/60 | Ht 63.75 in | Wt 115.2 lb

## 2022-09-22 DIAGNOSIS — E7800 Pure hypercholesterolemia, unspecified: Secondary | ICD-10-CM

## 2022-09-22 DIAGNOSIS — I2510 Atherosclerotic heart disease of native coronary artery without angina pectoris: Secondary | ICD-10-CM | POA: Diagnosis not present

## 2022-09-22 DIAGNOSIS — I2583 Coronary atherosclerosis due to lipid rich plaque: Secondary | ICD-10-CM

## 2022-09-22 DIAGNOSIS — Z953 Presence of xenogenic heart valve: Secondary | ICD-10-CM

## 2022-09-22 DIAGNOSIS — R002 Palpitations: Secondary | ICD-10-CM

## 2022-09-22 NOTE — Patient Instructions (Signed)
    Follow-Up: At [facility], you and your health needs are our priority.  As part of our continuing mission to provide you with exceptional heart care, we have created designated Provider Care Teams.  These Care Teams include your primary Cardiologist (physician) and Advanced Practice Providers (APPs -  Physician Assistants and Nurse Practitioners) who all work together to provide you with the care you need, when you need it.  We recommend signing up for the patient portal called "MyChart".  Sign up information is provided on this After Visit Summary.  MyChart is used to connect with patients for Virtual Visits (Telemedicine).  Patients are able to view lab/test results, encounter notes, upcoming appointments, etc.  Non-urgent messages can be sent to your provider as well.   To learn more about what you can do with MyChart, go to [email or web address].    Your next appointment:   6 month(s)  Provider:   Brian Crenshaw, MD      

## 2022-09-23 ENCOUNTER — Telehealth: Payer: Self-pay | Admitting: Cardiology

## 2022-09-23 DIAGNOSIS — R002 Palpitations: Secondary | ICD-10-CM

## 2022-09-23 MED ORDER — METOPROLOL TARTRATE 25 MG PO TABS
12.5000 mg | ORAL_TABLET | Freq: Two times a day (BID) | ORAL | 3 refills | Status: AC
Start: 2022-09-23 — End: ?

## 2022-09-23 NOTE — Telephone Encounter (Signed)
*  STAT* If patient is at the pharmacy, call can be transferred to refill team.   1. Which medications need to be refilled? (please list name of each medication and dose if known)  metoprolol tartrate (LOPRESSOR) 25 MG tablet  2. Which pharmacy/location (including street and city if local pharmacy) is medication to be sent to? [facility] - [place] - [address]  3. Do they need a 30 day or 90 day supply? 90 day  

## 2022-09-23 NOTE — Telephone Encounter (Signed)
Pt's medication was sent to pt's pharmacy as requested. Confirmation received.  °

## 2022-09-28 DIAGNOSIS — H40053 Ocular hypertension, bilateral: Secondary | ICD-10-CM | POA: Diagnosis not present

## 2022-09-28 DIAGNOSIS — H40013 Open angle with borderline findings, low risk, bilateral: Secondary | ICD-10-CM | POA: Diagnosis not present

## 2022-10-08 ENCOUNTER — Ambulatory Visit
Admission: EM | Admit: 2022-10-08 | Discharge: 2022-10-08 | Disposition: A | Discharge status: Home / Self Care | Payer: Medicare HMO | Attending: Internal Medicine | Admitting: Internal Medicine

## 2022-10-08 DIAGNOSIS — S61412A Laceration without foreign body of left hand, initial encounter: Secondary | ICD-10-CM

## 2022-10-08 NOTE — ED Triage Notes (Signed)
Pt states laceration to her left hand states a metal cup fell on her hand 3 days ago. Laceration noted with bruising to hand, bleeding controlled.

## 2022-10-08 NOTE — ED Provider Notes (Signed)
[facility]    CSN: [id number] Arrival date & time: 10/08/22  0825      History   Chief Complaint Chief Complaint  Patient presents with   Laceration    HPI Linda Parker is a 87 y.o. female.   Patient presents with laceration to the left hand that occurred about 3 days ago while she was [place] in [place].  Patient reports that it a metal [employer] cup was blown by the wind into her hand causing a laceration.  She states that she has been applying dressings and antibiotic ointment.  She does not take any blood thinning medications.  Has full range of motion of hand.  Denies numbness or tingling.  Reports that her tetanus vaccine was updated 1 year ago.    Laceration   Past Medical History:  Diagnosis Date   Allergy    Anxiety    Aortic stenosis    s/p AVR w/ 23 mm Edward Magna-ease Pericardial Valve   Arthritis    hands & knee- L    Coronary artery disease    Depression    Dysrhythmia    when taking antihistamines   Heart murmur    History of echocardiogram    Echo (8/15): EF 55-60%, normal wall motion, grade 1 diastolic dysfunction, AVR okay (mean 10 mm Hg), normal RV function, moderate TR   Osteoporosis    Palpitations    Unspecified hypothyroidism    Vertigo     Patient Active Problem List   Diagnosis Date Noted   S/P aortic valve replacement with bioprosthetic valve 04/21/2015   CAD (coronary artery disease) 09/24/2013   Postoperative anemia due to acute blood loss 03/05/2013   OA (osteoarthritis) of knee 03/04/2013   Chest pain 08/25/2011   Aortic valve disorder 04/16/2009   UNSPECIFIED HYPOTHYROIDISM 02/23/2009   PALPITATIONS 02/23/2009   DYSPNEA 02/23/2009    Past Surgical History:  Procedure Laterality Date   ABDOMINAL HYSTERECTOMY     AORTIC VALVE REPLACEMENT N/A 09/24/2013   Procedure: AORTIC VALVE REPLACEMENT (AVR) using Pericardial Tissue Valve (size 23mm).;  Surgeon: Alleen Borne, MD;  Location: [facility] OR;  Service: Open Heart  Surgery;  Laterality: N/A;   APPENDECTOMY     at time of hysterectomy   bilateral knee surgery  35 yrs. ago   cartilage removed from knees   CARDIAC CATHETERIZATION     CARDIAC VALVE REPLACEMENT     CORONARY ARTERY BYPASS GRAFT N/A 09/24/2013   Procedure: CORONARY ARTERY BYPASS GRAFTING (CABG) x4: LIMA to LAD, SVG to Diagonal 1, SVG to Diagonal 2, Svg to OM 1.;  Surgeon: Alleen Borne, MD;  Location: [facility] OR;  Service: Open Heart Surgery;  Laterality: N/A;   FINGER SURGERY     left hand   INJECTION KNEE Left 03/04/2013   Procedure: left KNEE cortisone INJECTION;  Surgeon: Loanne Drilling, MD;  Location: [facility] ORS;  Service: Orthopedics;  Laterality: Left;   INTRAOPERATIVE TRANSESOPHAGEAL ECHOCARDIOGRAM N/A 09/24/2013   Procedure: INTRAOPERATIVE TRANSESOPHAGEAL ECHOCARDIOGRAM;  Surgeon: Alleen Borne, MD;  Location[facility] OR;  Service: Open Heart Surgery;  Laterality: N/A;   LEFT AND RIGHT HEART CATHETERIZATION WITH CORONARY ANGIOGRAM N/A 09/17/2013   Procedure: LEFT AND RIGHT HEART CATHETERIZATION WITH CORONARY ANGIOGRAM;  Surgeon: Micheline Chapman, MD;  Location: [facility] CATH LAB;  Service: Cardiovascular;  Laterality: N/A;   MASS EXCISION Right 05/17/2018   Procedure: EXCISION CYST RIGHT RING FINGER, DEBRIDEMENT DISTAL INTERPHALANGEAL RIGHT FINGER;  Surgeon: Cindee Salt, MD;  Location: [facility];  Service: Orthopedics;  Laterality: Right;   MASS EXCISION Right 07/14/2020   Procedure: EXCISION CYST, DEBRIDEMENT DISTAL INTERPHALANGEAL JOINT RIGHT INDEX FINGER;  Surgeon: Cindee Salt, MD;  Location: [facility];  Service: Orthopedics;  Laterality: Right;  IV REGIONAL FOREARM BLOCK; 60 MINUTES   TONSILLECTOMY     TOTAL KNEE ARTHROPLASTY Right 03/04/2013   Procedure: RIGHT TOTAL KNEE ARTHROPLASTY;  Surgeon: Loanne Drilling, MD;  Location: [facility];  Service: Orthopedics;  Laterality: Right;    OB History   No obstetric history on file.      Home Medications    Prior to  Admission medications   Medication Sig Start Date End Date Taking? Authorizing Provider  acetaminophen (TYLENOL) 325 MG tablet Take 650 mg by mouth every 6 (six) hours as needed. Patient not taking: Reported on 09/22/2022    [provider]  acyclovir (ZOVIRAX) 400 MG tablet Take 400 mg by mouth 5 (five) times daily. As needed Patient not taking: Reported on 09/22/2022 01/21/22   [provider]  Ascorbic Acid (VITAMIN C) POWD Take by mouth. Taking 1/4 teaspoon daily Patient not taking: Reported on 09/22/2022    [provider]  aspirin 81 MG tablet Take 81 mg by mouth daily.    [provider]  cephALEXin (KEFLEX) 500 MG capsule Take 500 mg by mouth 3 (three) times daily. Patient not taking: Reported on 09/22/2022 03/11/22   [provider]  cholecalciferol (VITAMIN D) 1000 UNITS tablet Take 2,000 Units by mouth daily.     [provider]  CINNAMON PO Take by mouth. Takes 1/4 teaspoon daily    [provider]  Cyanocobalamin (VITAMIN B-12 PO) Take by mouth as directed. Patient not taking: Reported on 09/22/2022    [provider]  diphenoxylate-atropine (LOMOTIL) 2.5-0.025 MG tablet Take 1 tablet by mouth 4 (four) times daily as needed for diarrhea or loose stools. Patient not taking: Reported on 09/22/2022 12/23/21   Cirigliano, Vito V, DO  estradiol (ESTRACE) 0.5 MG tablet Take 0.5 mg by mouth daily before breakfast.     [provider]  fluticasone (FLONASE) 50 MCG/ACT nasal spray Place 1 spray into both nostrils as needed.    [provider]  folic acid (FOLVITE) 800 MCG tablet Take 400 mcg by mouth daily. Patient not taking: Reported on 09/22/2022    [provider]  Glucosamine-Chondroitin (GLUCOSAMINE CHONDR COMPLEX PO) Take 1 tablet by mouth 2 (two) times daily.    [provider]  hydrocortisone 2.5 % ointment Apply 1 Application topically as needed.    [provider]   levothyroxine (SYNTHROID) 25 MCG tablet Take by mouth.    [provider]  levothyroxine (SYNTHROID, LEVOTHROID) 50 MCG tablet Take 50 mcg by mouth daily.     [provider]  lovastatin (MEVACOR) 20 MG tablet TAKE 1 TABLET AT BEDTIME 03/07/22   Lewayne Bunting, MD  Menaquinone-7 40 MCG TABS Take by mouth. 06/24/20   [provider]  methocarbamol (ROBAXIN) 500 MG tablet Take 1 tablet by mouth as needed. Patient not taking: Reported on 09/22/2022 11/11/20   [provider]  metoprolol tartrate (LOPRESSOR) 25 MG tablet Take 0.5 tablets (12.5 mg total) by mouth 2 (two) times daily. 09/23/22   Lewayne Bunting, MD  omega-3 acid ethyl esters (LOVAZA) 1 G capsule Take 1 g by mouth 2 (two) times daily.    [provider]  omeprazole (PRILOSEC) 40 MG capsule  Take 1 capsule (40 mg total) by mouth in the morning and at bedtime. Take 40 mg twice a day for 4 weeks Patient not taking: Reported on 09/22/2022 12/30/21   Cirigliano, Vito V, DO  sertraline (ZOLOFT) 50 MG tablet Take 50 mg by mouth daily. Patient not taking: Reported on 09/22/2022 10/28/21   [provider]  traMADol (ULTRAM) 50 MG tablet Take 1 tablet (50 mg total) by mouth every 6 (six) hours as needed. Patient not taking: Reported on 09/22/2022 07/14/20   Cindee Salt, MD  vitamin E 400 UNIT capsule Take 400 Units by mouth daily.    [provider]  VITAMIN K PO Take by mouth.    [provider]    Family History Family History  Problem Relation Age of Onset   Heart disease Mother    Heart failure Mother    Esophageal cancer Father    Heart disease Father    Heart disease Sister    Hyperlipidemia Sister    Heart disease Sister    Heart disease Brother    ALS Brother     Social History Social History   Tobacco Use   Smoking status: Never   Smokeless tobacco: Never  Vaping Use   Vaping Use: Never used  Substance Use Topics   Alcohol use: Yes    Alcohol/week: 1.0  standard drink of alcohol    Types: 1 Glasses of wine per week    Comment: 4 oz nightly red wine   Drug use: No     Allergies   Erythromycin; Erythromycin base; Monosodium glutamate; Nitrates, organic; Sulfamethoxazole; Sulfonamide derivatives; Ciprofloxacin; and Versed [midazolam]   Review of Systems Review of Systems Per HPI  Physical Exam Triage Vital Signs ED Triage Vitals  Enc Vitals Group     BP 10/08/22 0841 131/75     Pulse Rate 10/08/22 0841 67     Resp 10/08/22 0841 16     Temp 10/08/22 0841 97.8 F (36.6 C)     Temp Source 10/08/22 0841 Oral     SpO2 10/08/22 0841 98 %     Weight --      Height --      Head Circumference --      Peak Flow --      Pain Score 10/08/22 0838 0     Pain Loc --      Pain Edu? --      Excl. in GC? --    No data found.  Updated Vital Signs BP 131/75 (BP Location: Left Arm)   Pulse 67   Temp 97.8 F (36.6 C) (Oral)   Resp 16   SpO2 98%   Visual Acuity Right Eye Distance:   Left Eye Distance:   Bilateral Distance:    Right Eye Near:   Left Eye Near:    Bilateral Near:     Physical Exam Constitutional:      General: She is not in acute distress.    Appearance: Normal appearance. She is not toxic-appearing or diaphoretic.  HENT:     Head: Normocephalic and atraumatic.  Eyes:     Extraocular Movements: Extraocular movements intact.     Conjunctiva/sclera: Conjunctivae normal.  Pulmonary:     Effort: Pulmonary effort is normal.  Skin:    Comments: Patient has approximately 0.5 inch very superficial linear laceration present to dorsal surface of left hand in between first and second digit.  No bleeding noted.  Patient does have very minimal surrounding bruising  and swelling discoloration.  No signs of infection.  Patient has full range of motion of fingers and is neurovascularly intact with normal capillary refill and pulses.  Neurological:     General: No focal deficit present.     Mental Status: She is alert and  oriented to person, place, and time. Mental status is at baseline.  Psychiatric:        Mood and Affect: Mood normal.        Behavior: Behavior normal.        Thought Content: Thought content normal.        Judgment: Judgment normal.      UC Treatments / Results  Labs (all labs ordered are listed, but only abnormal results are displayed) Labs Reviewed - No data to display  EKG   Radiology No results found.  Procedures Procedures (including critical care time)  Medications Ordered in UC Medications - No data to display  Initial Impression / Assessment and Plan / UC Course  I have reviewed the triage vital signs and the nursing notes.  Pertinent labs & imaging results that were available during my care of the patient were reviewed by me and considered in my medical decision making (see chart for details).     Patient has healing laceration to dorsal surface of left hand.  It appears to be a possible avulsion laceration with no closure needed.  No signs of infection present.  Clinical staff applied a dressing with antibiotic ointment and patient was encouraged to change dressing daily and monitor for signs of infection.  Advised strict follow-up precautions.  Tetanus vaccine already up-to-date per patient report.  Offered patient x-ray of hand but she declined this.  Patient verbalized understanding and was agreeable with plan. Final Clinical Impressions(s) / UC Diagnoses   Final diagnoses:  Laceration of left hand without foreign body, initial encounter     Discharge Instructions      Change dressing daily with antibiotic ointment.  Monitor for signs of infection that include increased redness, swelling, pus and follow-up sooner if these occur.    ED Prescriptions   None    PDMP not reviewed this encounter.   Gustavus Bryant, [place] 10/08/22 [phone]

## 2022-10-08 NOTE — Discharge Instructions (Signed)
Change dressing daily with antibiotic ointment.  Monitor for signs of infection that include increased redness, swelling, pus and follow-up sooner if these occur.

## 2022-12-12 ENCOUNTER — Telehealth: Payer: Self-pay | Admitting: Cardiology

## 2022-12-12 NOTE — Telephone Encounter (Signed)
Pt c/o Shortness Of Breath: STAT if SOB developed within the last 24 hours or pt is noticeably SOB on the phone  1. Are you currently SOB (can you hear that pt is SOB on the phone)? No  2. How long have you been experiencing SOB? 2 months  3. Are you SOB when sitting or when up moving around? Moving around, bending over  4. Are you currently experiencing any other symptoms? No  Pt states that she also would like advise from nurse regarding hurting in  her left side as well. Please advise

## 2022-12-12 NOTE — Telephone Encounter (Signed)
Returned call to pt. Pt states she has been experiencing SOB with exertion. She has been SOB for a "couple" of months. She thinks this is all lung related but she just wants a follow up. She does not have any other "issues" and she just wants a visit. This nurse scheduled her with an APP in Sept. Pt. Verbalized understanding and said she will be here.

## 2022-12-28 DIAGNOSIS — L570 Actinic keratosis: Secondary | ICD-10-CM | POA: Diagnosis not present

## 2022-12-28 DIAGNOSIS — L820 Inflamed seborrheic keratosis: Secondary | ICD-10-CM | POA: Diagnosis not present

## 2022-12-28 DIAGNOSIS — L821 Other seborrheic keratosis: Secondary | ICD-10-CM | POA: Diagnosis not present

## 2022-12-28 DIAGNOSIS — Z85828 Personal history of other malignant neoplasm of skin: Secondary | ICD-10-CM | POA: Diagnosis not present

## 2022-12-29 DIAGNOSIS — H40013 Open angle with borderline findings, low risk, bilateral: Secondary | ICD-10-CM | POA: Diagnosis not present

## 2023-01-03 ENCOUNTER — Ambulatory Visit: Payer: Medicare HMO | Admitting: Pulmonary Disease

## 2023-01-03 ENCOUNTER — Encounter: Payer: Self-pay | Admitting: Pulmonary Disease

## 2023-01-03 ENCOUNTER — Ambulatory Visit (INDEPENDENT_AMBULATORY_CARE_PROVIDER_SITE_OTHER): Payer: Medicare HMO

## 2023-01-03 VITALS — BP 124/80 | HR 70 | Temp 98.1°F | Ht 63.0 in | Wt 117.8 lb

## 2023-01-03 DIAGNOSIS — R0782 Intercostal pain: Secondary | ICD-10-CM

## 2023-01-03 DIAGNOSIS — R079 Chest pain, unspecified: Secondary | ICD-10-CM | POA: Diagnosis not present

## 2023-01-03 MED ORDER — SPIRIVA RESPIMAT 2.5 MCG/ACT IN AERS: 2.0000 | INHALATION_SPRAY | Freq: Every day | RESPIRATORY_TRACT | Status: DC

## 2023-01-03 NOTE — Patient Instructions (Addendum)
Not to see you again  For the shortness of breath try Spiriva 2 puffs once a day  When I press on rib on the left there is some pain.  Suspect pain related to abdominal muscle issue.  Will get a chest x-ray for further evaluation.  Try Salonpas lidocaine patches over-the-counter.  Place over the area of the pain.  Good on the morning take it off in the evening.  Leave on for 12 hours at a time.  Would also be okay to take Tylenol Extra Strength 1 tablet (500 mg) up to 3 times a day to see if this helps with the pain.  Return to clinic in 3 months or sooner as needed with Dr. Laurena Spies

## 2023-01-03 NOTE — Progress Notes (Addendum)
@Patient  ID: Carolan Clines, female    DOB: 06-Oct-1935, 87 y.o.   MRN: [id number]  Chief Complaint  Patient presents with   Follow-up    Pain around Left rib area when coughing or sneezing x 3 months.    Referring provider: Tisovec, Adelfa Koh, MD  HPI:   87 y.o. woman whom we are seeing in consultation for evaluation of left-sided chest/flank pain.  Note from PCP reviewed.  Most recent cardiology note  reviewed.    Returns over urinalysis last visit.  Seems chest discomfort had improved with Salonpas lidocaine patches as I recommended.  Musculoskeletal in nature given clear chest x-ray, rectal exam etc.  Has returned over the last couple of months.  With sneezing or coughing.  Denies any fall or mechanical injury to the area.  Describes her shortness of breath of the last few months.  With exertion.  No times a day when things are better or worse.  Evidently seems better or worse etc.  No relieving or exacerbating factors.  Does not use inhalers.  Reviewed chest x-rays in the past with signs of mild hyperinflation.  We discussed trialing inhaler therapy.  She has upcoming appointment with cardiology for further evaluation given her known cardiac history.  HPI at initial visit: Overall she is doing well.  She denies any respiratory symptoms.  No shortness of breath.  No consistent cough.  She reports about 53-month history of left-sided chest and flank pain.  No flank pain.  Starts about nipple line and radiates posteriorly, occasionally elevated spine.  Exacerbated by cough, sneeze.  Occasional deep inspiration.  Not with positions or movement.  Gradually improving over time in terms of severity.  Seen by PCP many weeks ago and diagnosed with pleurisy.  However this is persisted for months as a close to the few weeks she was told it would.  Work-up includes CTA PE protocol 01/2021 that on my review interpretation shows no PE, clear lungs, no parenchymal source of symptoms.  Do not see any  fractured ribs on imaging is well.  She denies any other relieving or exacerbating factors.  Has not taken Tylenol or ibuprofen.  Since its intermittent and somewhat unpredictable, she does not feel the need to take a daily medication for this.  Especially as intensity is gradually improving over time.  PMH: Allergies, arthritis Surgical history: Heart valve and CABG 2015, hysterectomy, appendectomy Family history: Reviewed, no significant respiratory illness of first relatives Social history: Never smoker, lives in [place] with husband  Questionaires / Pulmonary Flowsheets:   ACT:      No data to display          MMRC:     No data to display          Epworth:      No data to display          Tests:   FENO:  No results found for: "NITRICOXIDE"  PFT:    Latest Ref Rng & Units 09/20/2013    1:06 PM  PFT Results  FVC-Pre L 2.41   FVC-Predicted Pre % 90   Pre FEV1/FVC % % 74   FEV1-Pre L 1.78   FEV1-Predicted Pre % 89   DLCO uncorrected ml/min/mmHg 15.85   DLCO UNC% % 69   DLCO corrected ml/min/mmHg 15.31   DLCO COR %Predicted % 66   DLVA Predicted % 88   TLC L 4.62   TLC % Predicted % 94   RV %  Predicted % 87   Personally reviewed and interpreted as normal spirometry, lung volumes within normal limits, DLCO moderately reduced but likely underestimated given volume used to calculate DLCO was less than 85% of TLC recorded  WALK:      No data to display          Imaging: Personally reviewed as per EMR discussion this note  Lab Results: Personally reviewed CBC    Component Value Date/Time   WBC 8.5 12/18/2021 1206   RBC 3.75 (L) 12/18/2021 1206   HGB 12.7 12/18/2021 1206   HCT 36.8 12/18/2021 1206   PLT 287 12/18/2021 1206   MCV 98.1 12/18/2021 1206   MCH 33.9 12/18/2021 1206   MCHC 34.5 12/18/2021 1206   RDW 12.8 12/18/2021 1206   LYMPHSABS 1.2 12/18/2021 1206   MONOABS 1.2 (H) 12/18/2021 1206   EOSABS 0.0 12/18/2021 1206    BASOSABS 0.0 12/18/2021 1206    BMET    Component Value Date/Time   NA 135 12/18/2021 1206   K 3.9 12/18/2021 1206   CL 100 12/18/2021 1206   CO2 24 12/18/2021 1206   GLUCOSE 98 12/18/2021 1206   BUN 18 12/18/2021 1206   CREATININE 0.61 12/18/2021 1206   CALCIUM 9.0 12/18/2021 1206   GFRNONAA >60 12/18/2021 1206   GFRAA >90 09/27/2013 0413    BNP No results found for: "BNP"  ProBNP No results found for: "PROBNP"  Specialty Problems       Pulmonary Problems   DYSPNEA    Qualifier: Diagnosis of  By: Jens Som, MD, Lyn Hollingshead        Allergies  Allergen Reactions   Erythromycin Nausea Only    Feels like she is dying/out of her body   Erythromycin Base Other (See Comments)    Has out of body feeling   Monosodium Glutamate Other (See Comments)    Migraine headache    Nitrates, Organic     headache   Sulfamethoxazole Other (See Comments)    Makes her feel strange   Sulfonamide Derivatives Nausea Only    Feels like she is dying/out of her body   Ciprofloxacin Palpitations    Feels out of her body experience   Versed [Midazolam] Other (See Comments)    Pt experienced forgetfulness for days after and requests we do not use.    Immunization History  Administered Date(s) Administered   Influenza-Unspecified 03/09/2021   PFIZER(Purple Top)SARS-COV-2 Vaccination 05/15/2019, 06/02/2019    Past Medical History:  Diagnosis Date   Allergy    Anxiety    Aortic stenosis    s/p AVR w/ 23 mm Edward Magna-ease Pericardial Valve   Arthritis    hands & knee- L    Coronary artery disease    Depression    Dysrhythmia    when taking antihistamines   Heart murmur    History of echocardiogram    Echo (8/15): EF 55-60%, normal wall motion, grade 1 diastolic dysfunction, AVR okay (mean 10 mm Hg), normal RV function, moderate TR   Osteoporosis    Palpitations    Unspecified hypothyroidism    Vertigo     Tobacco History: Social History   Tobacco Use   Smoking Status Never  Smokeless Tobacco Never   Counseling given: Not Answered   Continue to not smoke  Outpatient Encounter Medications as of 01/03/2023  Medication Sig   acetaminophen (TYLENOL) 325 MG tablet Take 650 mg by mouth every 6 (six) hours as needed.   acyclovir (ZOVIRAX) 400  MG tablet Take 400 mg by mouth 2 (two) times daily as needed (for cold sore). As needed   Ascorbic Acid (VITAMIN C) POWD Take by mouth. Taking 1/4 teaspoon daily   aspirin 81 MG tablet Take 81 mg by mouth daily.   cholecalciferol (VITAMIN D) 1000 UNITS tablet Take 2,000 Units by mouth daily.    CINNAMON PO Take by mouth. Takes 1/4 teaspoon daily   Cyanocobalamin (VITAMIN B-12 PO) Take by mouth as directed.   estradiol (ESTRACE) 0.5 MG tablet Take 0.5 mg by mouth daily before breakfast.    fluticasone (FLONASE) 50 MCG/ACT nasal spray Place 1 spray into both nostrils as needed.   folic acid (FOLVITE) 800 MCG tablet Take 400 mcg by mouth daily.   Glucosamine-Chondroitin (GLUCOSAMINE CHONDR COMPLEX PO) Take 1 tablet by mouth 2 (two) times daily.   hydrocortisone 2.5 % ointment Apply 1 Application topically as needed.   levothyroxine (SYNTHROID) 25 MCG tablet Take by mouth.   lovastatin (MEVACOR) 20 MG tablet TAKE 1 TABLET AT BEDTIME (Patient taking differently: Take 20 mg by mouth every other day.)   metoprolol tartrate (LOPRESSOR) 25 MG tablet Take 0.5 tablets (12.5 mg total) by mouth 2 (two) times daily.   omega-3 acid ethyl esters (LOVAZA) 1 G capsule Take 1 g by mouth 2 (two) times daily.   traMADol (ULTRAM) 50 MG tablet Take 1 tablet (50 mg total) by mouth every 6 (six) hours as needed.   vitamin E 400 UNIT capsule Take 400 Units by mouth daily.   VITAMIN K PO Take by mouth.   [DISCONTINUED] cephALEXin (KEFLEX) 500 MG capsule Take 500 mg by mouth 3 (three) times daily. (Patient not taking: Reported on 09/22/2022)   [DISCONTINUED] diphenoxylate-atropine (LOMOTIL) 2.5-0.025 MG tablet Take 1 tablet by  mouth 4 (four) times daily as needed for diarrhea or loose stools. (Patient not taking: Reported on 09/22/2022)   [DISCONTINUED] levothyroxine (SYNTHROID, LEVOTHROID) 50 MCG tablet Take 50 mcg by mouth daily.  (Patient not taking: Reported on 01/03/2023)   [DISCONTINUED] Menaquinone-7 40 MCG TABS Take by mouth. (Patient not taking: Reported on 01/03/2023)   [DISCONTINUED] methocarbamol (ROBAXIN) 500 MG tablet Take 1 tablet by mouth as needed. (Patient not taking: Reported on 09/22/2022)   [DISCONTINUED] omeprazole (PRILOSEC) 40 MG capsule Take 1 capsule (40 mg total) by mouth in the morning and at bedtime. Take 40 mg twice a day for 4 weeks (Patient not taking: Reported on 09/22/2022)   [DISCONTINUED] sertraline (ZOLOFT) 50 MG tablet Take 50 mg by mouth daily. (Patient not taking: Reported on 09/22/2022)   Facility-Administered Encounter Medications as of 01/03/2023  Medication   0.9 %  sodium chloride infusion     Review of Systems  Review of Systems  N/a  Physical Exam  BP 124/80 (BP Location: Left Arm, Patient Position: Sitting, Cuff Size: Normal)   Pulse 70   Temp 98.1 F (36.7 C) (Oral)   Ht 5\' 3"  (1.6 m)   Wt 117 lb 12.8 oz (53.4 kg)   SpO2 98%   BMI 20.87 kg/m   Wt Readings from Last 5 Encounters:  01/03/23 117 lb 12.8 oz (53.4 kg)  09/22/22 115 lb 3.2 oz (52.3 kg)  03/14/22 113 lb 3.2 oz (51.3 kg)  12/30/21 104 lb (47.2 kg)  12/23/21 104 lb 6 oz (47.3 kg)    BMI Readings from Last 5 Encounters:  01/03/23 20.87 kg/m  09/22/22 19.93 kg/m  03/14/22 19.58 kg/m  12/30/21 19.33 kg/m  12/23/21 19.40 kg/m  Physical Exam General: Well-appearing, no acute distress Eyes: EOMI, icterus Neck: Supple, no JVP Pulmonary: Clear, left flank pain with deep inspiration, normal work of breathing Cardiovascular: Regular rate and rhythm, no murmurs Abdomen: Nondistended, bowel sounds present MSK: Point tenderness over left lateral/flank area when palpating between ribs, no  synovitis on peripheral joints Neuro: Normal gait, no weakness Psych: Normal mood, full affect   Assessment & Plan:   Left-sided chest pain: Present for several months.  Worse with coughing or sneezing.  Does not feel worse with positions.  CT chest 01/2021 reviewed with no PE, no intraparenchymal cause of her symptoms.  Tender to palpation on exam.  MSK in nature.  Recommended local lidocaine or Salonpas patches for 12 hours on, 12 hours off 04/2021.  Symptoms better for over a year.  Has reoccurred and worse with sneezing coughing.  Again reproducible with palpation on the left.  Again suspect MSK in nature.  Trial Salonpas, lidocaine.  CXR today. Consider PT referral for core strengthening if not improving.  Dyspnea on exertion: Unclear etiology.  Seems relatively mild.  New symptom.  Trial Spiriva 2 puff daily given mild hyperinflation.  Consider PFTs in the future.  Has upcoming cardiology evaluation.   Return in about 3 months (around 04/04/2023).   Karren Burly, MD 01/03/2023

## 2023-01-09 ENCOUNTER — Telehealth: Payer: Self-pay | Admitting: Cardiology

## 2023-01-09 NOTE — Telephone Encounter (Signed)
Spoke with pt, Linda Parker.

## 2023-01-09 NOTE — Telephone Encounter (Signed)
Calling to see if its okay for her to take the new covid shot. Please advise

## 2023-01-11 ENCOUNTER — Telehealth: Payer: Self-pay | Admitting: Pulmonary Disease

## 2023-01-11 NOTE — Telephone Encounter (Signed)
Patient would like to know the results of her x-ray. She can be reached at [phone]

## 2023-01-13 ENCOUNTER — Encounter: Payer: Self-pay | Admitting: Pulmonary Disease

## 2023-01-13 NOTE — Telephone Encounter (Signed)
Attempted to call pt but unable to reach. Left message to return call.

## 2023-01-13 NOTE — Telephone Encounter (Signed)
The radiologist has yet to read the xray but it looks clear to my eye. No cause of pain in the lungs which is good.

## 2023-01-14 ENCOUNTER — Encounter: Payer: Self-pay | Admitting: Pulmonary Disease

## 2023-01-18 NOTE — Progress Notes (Signed)
Cardiology Clinic Note   Patient Name: Linda Parker Date of Encounter: 01/20/2023  Primary Care Provider:  Gaspar Garbe, MD Primary Cardiologist:  Olga Millers, MD  Patient Profile    87 year old female with history of a aortic valve repair with bioprosthetic 23 mm [facility] Ease pericardial valve (SBE prophylaxis required), CAD status post CABG on 615 (LIMA to LAD, SVG to diagonal 1, SVG to diagonal 2, SVG to OM1), coronary CTA October 2022 revealed coronary and aortic atherosclerosis with borderline dilatation of the aortic arch at 3.1 cm.    Last seen by Dr. Jens Som on 09/22/2022 at which time she was complaining of worsening dyspnea on exertion with palpitations.  No medication changes are made or testing completed.  Past Medical History    Past Medical History:  Diagnosis Date   Allergy    Anxiety    Aortic stenosis    s/p AVR w/ 23 mm [facility] Magna-ease Pericardial Valve   Arthritis    hands & knee- L    Coronary artery disease    Depression    Dysrhythmia    when taking antihistamines   Heart murmur    History of echocardiogram    Echo (8/15): EF 55-60%, normal wall motion, grade 1 diastolic dysfunction, AVR okay (mean 10 mm Hg), normal RV function, moderate TR   Osteoporosis    Palpitations    Unspecified hypothyroidism    Vertigo    Past Surgical History:  Procedure Laterality Date   ABDOMINAL HYSTERECTOMY     AORTIC VALVE REPLACEMENT N/A 09/24/2013   Procedure: AORTIC VALVE REPLACEMENT (AVR) using Pericardial Tissue Valve (size 23mm).;  Surgeon: Alleen Borne, MD;  Location: [facility] OR;  Service: Open Heart Surgery;  Laterality: N/A;   APPENDECTOMY     at time of hysterectomy   bilateral knee surgery  35 yrs. ago   cartilage removed from knees   CARDIAC CATHETERIZATION     CARDIAC VALVE REPLACEMENT     CORONARY ARTERY BYPASS GRAFT N/A 09/24/2013   Procedure: CORONARY ARTERY BYPASS GRAFTING (CABG) x4: LIMA to LAD, SVG to Diagonal 1, SVG to Diagonal 2, Svg  to OM 1.;  Surgeon: Alleen Borne, MD;  Location: [facility] OR;  Service: Open Heart Surgery;  Laterality: N/A;   FINGER SURGERY     left hand   INJECTION KNEE Left 03/04/2013   Procedure: left KNEE cortisone INJECTION;  Surgeon: Loanne Drilling, MD;  Location: [facility] ORS;  Service: Orthopedics;  Laterality: Left;   INTRAOPERATIVE TRANSESOPHAGEAL ECHOCARDIOGRAM N/A 09/24/2013   Procedure: INTRAOPERATIVE TRANSESOPHAGEAL ECHOCARDIOGRAM;  Surgeon: Alleen Borne, MD;  Location: [facility] OR;  Service: Open Heart Surgery;  Laterality: N/A;   LEFT AND RIGHT HEART CATHETERIZATION WITH CORONARY ANGIOGRAM N/A 09/17/2013   Procedure: LEFT AND RIGHT HEART CATHETERIZATION WITH CORONARY ANGIOGRAM;  Surgeon: Micheline Chapman, MD;  Location: [facility] CATH LAB;  Service: Cardiovascular;  Laterality: N/A;   MASS EXCISION Right 05/17/2018   Procedure: EXCISION CYST RIGHT RING FINGER, DEBRIDEMENT DISTAL INTERPHALANGEAL RIGHT FINGER;  Surgeon: Cindee Salt, MD;  Location: [facility];  Service: Orthopedics;  Laterality: Right;   MASS EXCISION Right 07/14/2020   Procedure: EXCISION CYST, DEBRIDEMENT DISTAL INTERPHALANGEAL JOINT RIGHT INDEX FINGER;  Surgeon: Cindee Salt, MD;  Location: [facility];  Service: Orthopedics;  Laterality: Right;  IV REGIONAL FOREARM BLOCK; 60 MINUTES   TONSILLECTOMY     TOTAL KNEE ARTHROPLASTY Right 03/04/2013   Procedure: RIGHT TOTAL KNEE ARTHROPLASTY;  Surgeon: Gus Rankin  Aluisio, MD;  Location: [facility]S;  Service: Orthopedics;  Laterality: Right;    Allergies  Allergies  Allergen Reactions   Erythromycin Nausea Only    Feels like she is dying/out of her body   Erythromycin Base Other (See Comments)    Has out of body feeling   Monosodium Glutamate Other (See Comments)    Migraine headache    Nitrates, Organic     headache   Sulfamethoxazole Other (See Comments)    Makes her feel strange   Sulfonamide Derivatives Nausea Only    Feels like she is dying/out of her body    Ciprofloxacin Palpitations    Feels out of her body experience   Versed [Midazolam] Other (See Comments)    Pt experienced forgetfulness for days after and requests we do not use.    History of Present Illness    Mrs. Deremer returns to the office today for ongoing assessment and management of coronary artery disease status post CABG in 2015, history of aortic valve replacement during CABG, hyperlipidemia and palpitations.  She has been followed by Dr. Judeth Horn pulmonologist for ongoing complaints of dyspnea, and left  lateral chest discomfort.  He believes that she has pulled a muscle.  There was no evidence of pleurisy cracked ribs or masses on his evaluation with x-rays.  She is now on Spiriva.  She started going to the gym and working out with her son.  She is a little sore from it but states that she is enjoying it and would like to get stronger.  She wants to make it to her 89th birthday.  Home Medications    Current Outpatient Medications  Medication Sig Dispense Refill   acetaminophen (TYLENOL) 325 MG tablet Take 650 mg by mouth every 6 (six) hours as needed.     acyclovir (ZOVIRAX) 400 MG tablet Take 400 mg by mouth 2 (two) times daily as needed (for cold sore). As needed     Ascorbic Acid (VITAMIN C) POWD Take by mouth. Taking 1/4 teaspoon daily     aspirin 81 MG tablet Take 81 mg by mouth daily.     cholecalciferol (VITAMIN D) 1000 UNITS tablet Take 2,000 Units by mouth daily.      CINNAMON PO Take by mouth. Takes 1/4 teaspoon daily     Cyanocobalamin (VITAMIN B-12 PO) Take by mouth as directed.     estradiol (ESTRACE) 0.5 MG tablet Take 0.5 mg by mouth daily before breakfast.      fluticasone (FLONASE) 50 MCG/ACT nasal spray Place 1 spray into both nostrils as needed.     Glucosamine-Chondroitin (GLUCOSAMINE CHONDR COMPLEX PO) Take 1 tablet by mouth 2 (two) times daily.     hydrocortisone 2.5 % ointment Apply 1 Application topically as needed.     influenza vac split trivalent  (FLUZONE) injection Inject into the muscle.     levothyroxine (SYNTHROID) 25 MCG tablet Take by mouth.     lovastatin (MEVACOR) 20 MG tablet TAKE 1 TABLET AT BEDTIME (Patient taking differently: Take 20 mg by mouth every other day.) 90 tablet 10   metoprolol tartrate (LOPRESSOR) 25 MG tablet Take 0.5 tablets (12.5 mg total) by mouth 2 (two) times daily. 90 tablet 3   omega-3 acid ethyl esters (LOVAZA) 1 G capsule Take 1 g by mouth 2 (two) times daily.     Tiotropium Bromide Monohydrate (SPIRIVA RESPIMAT) 2.5 MCG/ACT AERS Inhale 2 puffs into the lungs daily.     traMADol (ULTRAM) 50 MG tablet Take  1 tablet (50 mg total) by mouth every 6 (six) hours as needed. 20 tablet 0   vitamin E 400 UNIT capsule Take 400 Units by mouth daily.     VITAMIN K PO Take by mouth.     folic acid (FOLVITE) 800 MCG tablet Take 400 mcg by mouth daily. (Patient not taking: Reported on 01/20/2023)     Current Facility-Administered Medications  Medication Dose Route Frequency Provider Last Rate Last Admin   0.9 %  sodium chloride infusion  500 mL Intravenous Once Cirigliano, Vito V, DO         Family History    Family History  Problem Relation Age of Onset   Heart disease Mother    Heart failure Mother    Esophageal cancer Father    Heart disease Father    Heart disease Sister    Hyperlipidemia Sister    Heart disease Sister    Heart disease Brother    ALS Brother    She indicated that her mother is deceased. She indicated that her father is deceased. She indicated that both of her sisters are alive. She indicated that all of her three brothers are deceased. She indicated that her maternal grandmother is deceased. She indicated that her maternal grandfather is deceased. She indicated that her paternal grandmother is deceased. She indicated that her paternal grandfather is deceased. She indicated that her daughter is alive. She indicated that both of her sons are alive.  Social History    Social History    Socioeconomic History   Marital status: Married    Spouse name: Not on file   Number of children: 3   Years of education: Not on file   Highest education level: Not on file  Occupational History   Occupation: retired  Tobacco Use   Smoking status: Never   Smokeless tobacco: Never  Vaping Use   Vaping status: Never Used  Substance and Sexual Activity   Alcohol use: Yes    Alcohol/week: 1.0 standard drink of alcohol    Types: 1 Glasses of wine per week    Comment: 4 oz nightly red wine   Drug use: No   Sexual activity: Not Currently    Birth control/protection: Post-menopausal, Surgical  Other Topics Concern   Not on file  Social History Narrative   Not on file   Social Determinants of Health   Financial Resource Strain: Not on file  Food Insecurity: Not on file  Transportation Needs: Not on file  Physical Activity: Not on file  Stress: Not on file  Social Connections: Not on file  Intimate Partner Violence: Not on file     Review of Systems    General:  No chills, fever, night sweats or weight changes.  Cardiovascular: Positive for left lower lateral chest pain with inspiration,, no significant dyspnea on exertion, edema, orthopnea, palpitations, paroxysmal nocturnal dyspnea. Dermatological: No rash, lesions/masses Respiratory: No cough, dyspnea Urologic: No hematuria, dysuria Abdominal:   No nausea, vomiting, diarrhea, bright red blood per rectum, melena, or hematemesis Neurologic:  No visual changes, wkns, changes in mental status. All other systems reviewed and are otherwise negative except as noted above.  EKG Interpretation Date/Time:  Friday January 20 2023 09:10:28 EDT Ventricular Rate:  70 PR Interval:  138 QRS Duration:  98 QT Interval:  408 QTC Calculation: 440 R Axis:   -52  Text Interpretation: Sinus rhythm with marked sinus arrhythmia Left axis deviation Incomplete right bundle branch block When compared with ECG of  25-Sep-2013 06:47,  Incomplete right bundle branch block is now Present Criteria for Septal infarct are no longer Present Confirmed by Joni Reining [phone]) on 01/20/2023 9:41:55 AM    Physical Exam    VS:  BP 116/62 (BP Location: Left Arm, Patient Position: Sitting, Cuff Size: Normal)   Pulse 70   Ht 5\' 3"  (1.6 m)   Wt 117 lb 3.2 oz (53.2 kg)   SpO2 95%   BMI 20.76 kg/m  , BMI Body mass index is 20.76 kg/m.     GEN: Well nourished, well developed, in no acute distress. HEENT: normal. Neck: Supple, no JVD, carotid bruits, or masses. Cardiac: RRR, 2/6 harsh systolic murmur heard best at the right sternal border, does radiate into the abdomen and left carotid artery, no rubs, or gallops. No clubbing, cyanosis, edema.  Radials/DP/PT 2+ and equal bilaterally.  Respiratory:  Respirations regular and unlabored, clear to auscultation bilaterally. GI: Soft, nontender, nondistended, BS + x 4. MS: no deformity or atrophy.  Pain with palpation of the right ankle. Skin: warm and dry, no rash. Neuro:  Strength and sensation are intact. Psych: Normal affect.  EKG Interpretation Date/Time:  Friday January 20 2023 09:10:28 EDT Ventricular Rate:  70 PR Interval:  138 QRS Duration:  98 QT Interval:  408 QTC Calculation: 440 R Axis:   -52  Text Interpretation: Sinus rhythm with marked sinus arrhythmia Left axis deviation Incomplete right bundle branch block When compared with ECG of 25-Sep-2013 06:47, Incomplete right bundle branch block is now Present Criteria for Septal infarct are no longer Present Confirmed by Joni Reining [phone]) on 01/20/2023 9:41:55 AM   Lab Results  Component Value Date   WBC 8.5 12/18/2021   HGB 12.7 12/18/2021   HCT 36.8 12/18/2021   MCV 98.1 12/18/2021   PLT 287 12/18/2021   Lab Results  Component Value Date   CREATININE 0.61 12/18/2021   BUN 18 12/18/2021   NA 135 12/18/2021   K 3.9 12/18/2021   CL 100 12/18/2021   CO2 24 12/18/2021   Lab Results  Component Value  Date   ALT 14 12/18/2021   AST 15 12/18/2021   ALKPHOS 69 12/18/2021   BILITOT 0.4 12/18/2021   Lab Results  Component Value Date   CHOL 188 09/01/2021   HDL 84 09/01/2021   LDLCALC 91 09/01/2021   TRIG 71 09/01/2021   CHOLHDL 2.2 09/01/2021    Lab Results  Component Value Date   HGBA1C 5.4 09/20/2013     Review of Prior Studies EKG Interpretation Date/Time:  Friday January 20 2023 09:10:28 EDT Ventricular Rate:  70 PR Interval:  138 QRS Duration:  98 QT Interval:  408 QTC Calculation: 440 R Axis:   -52  Text Interpretation: Sinus rhythm with marked sinus arrhythmia Left axis deviation Incomplete right bundle branch block When compared with ECG of 25-Sep-2013 06:47, Incomplete right bundle branch block is now Present Criteria for Septal infarct are no longer Present Confirmed by Joni Reining [phone]) on 01/20/2023 9:41:55 AM    Echocardiogram 11/24/2020 1. Left ventricular ejection fraction, by estimation, is 60 to 65%. The  left ventricle has normal function. The left ventricle has no regional  wall motion abnormalities. There is mild left ventricular hypertrophy of  the basal-septal segment. Left  ventricular diastolic parameters are consistent with Grade I diastolic  dysfunction (impaired relaxation).   2. Right ventricular systolic function is normal. The right ventricular  size is normal.   3. Left atrial size was moderately  dilated.   4. Right atrial size was mildly dilated.   5. The mitral valve is normal in structure. Mild mitral valve  regurgitation. No evidence of mitral stenosis.   6. Tricuspid valve regurgitation is moderate.   7. The aortic valve is normal in structure. Aortic valve regurgitation is  not visualized. No aortic stenosis is present. There is a 23 mm [facility] Ease bioprosthetic valve present in the aortic position. Procedure  Date: 09/24/13. Echo findings are  consistent with normal structure and function of the aortic valve   prosthesis.   8. Aortic dilatation noted. There is mild dilatation of the ascending  aorta, measuring 39 mm.   9. The inferior vena cava is normal in size with greater than 50%  respiratory variability, suggesting right atrial pressure of 3 mmHg.    Assessment & Plan   1.  Bioprosthetic aortic valve: This is a 23 mm [facility] Ease pericardial valve.  SBE prophylaxis will be needed for dental procedures.  Repeating echocardiogram as 1 was last completed in August 2022 for ongoing surveillance.  She denies any issues with the aortic valve, denies any palpitations, substernal chest pain.   2.  Coronary artery disease: History of CABG x 4 in 2015.  Repeat coronary CTA revealed coronary and aortic atherosclerosis without any new areas of stenosis.  Will plan on secondary management with blood pressure control, statin therapy, purposeful exercise, and Mediterranean diet.  3.  Hypercholesterolemia: Remains on lovastatin 20 mg daily.  Patient will need to have fasting lipids and LFTs ordered on next office visit unless completed by primary care provider goal of LDL less than 70.  Last values in epic were completed in 2023 with total cholesterol 188 LDL 63 HDL 84.  4. Hypertension: Well-controlled.  No changes in her medication regimen at this time.        Signed, Bettey Mare. Liborio Nixon, ANP, AACC   01/20/2023 9:42 AM      Office [phone] Fax [phone]  Notice: This dictation was prepared with Dragon dictation along with smaller phrase technology. Any transcriptional errors that result from this process are unintentional and may not be corrected upon review.

## 2023-01-18 NOTE — Telephone Encounter (Signed)
Patient is returning phone call. Patient phone number is [phone]. May leave detailed message on voicemail.

## 2023-01-18 NOTE — Telephone Encounter (Signed)
Pt informed cxr clear. Nothing further needed.

## 2023-01-20 ENCOUNTER — Telehealth: Payer: Self-pay | Admitting: Pulmonary Disease

## 2023-01-20 ENCOUNTER — Ambulatory Visit: Payer: Medicare HMO | Attending: Adult Health | Admitting: Adult Health

## 2023-01-20 ENCOUNTER — Encounter: Payer: Self-pay | Admitting: Adult Health

## 2023-01-20 VITALS — BP 116/62 | HR 70 | Ht 63.0 in | Wt 117.2 lb

## 2023-01-20 DIAGNOSIS — I10 Essential (primary) hypertension: Secondary | ICD-10-CM

## 2023-01-20 DIAGNOSIS — I2510 Atherosclerotic heart disease of native coronary artery without angina pectoris: Secondary | ICD-10-CM | POA: Diagnosis not present

## 2023-01-20 DIAGNOSIS — Z953 Presence of xenogenic heart valve: Secondary | ICD-10-CM

## 2023-01-20 DIAGNOSIS — Z09 Encounter for follow-up examination after completed treatment for conditions other than malignant neoplasm: Secondary | ICD-10-CM | POA: Diagnosis not present

## 2023-01-20 DIAGNOSIS — I2583 Coronary atherosclerosis due to lipid rich plaque: Secondary | ICD-10-CM | POA: Diagnosis not present

## 2023-01-20 DIAGNOSIS — E7800 Pure hypercholesterolemia, unspecified: Secondary | ICD-10-CM | POA: Diagnosis not present

## 2023-01-20 NOTE — Telephone Encounter (Signed)
Patient reports that she has completed one of the two samples. She reports no change in pain/shortness of breath on her left side. Will continue to take the second sample and call us back to report if symptoms improved or not. And let us know if she will need a prescription of Spiriva at that time. sd

## 2023-01-20 NOTE — Patient Instructions (Signed)
Medication Instructions:  NO CHANGES    Lab Work: NONE    Testing/Procedures: [address]  Your physician has requested that you have an echocardiogram. Echocardiography is a painless test that uses sound waves to create images of your heart. It provides your doctor with information about the size and shape of your heart and how well your heart's chambers and valves are working. This procedure takes approximately one hour. There are no restrictions for this procedure. Please do NOT wear cologne, perfume, aftershave, or lotions (deodorant is allowed). Please arrive 15 minutes prior to your appointment time.    Follow-Up: At [facility], you and your health needs are our priority.  As part of our continuing mission to provide you with exceptional heart care, we have created designated Provider Care Teams.  These Care Teams include your primary Cardiologist (physician) and Advanced Practice Providers (APPs -  Physician Assistants and Nurse Practitioners) who all work together to provide you with the care you need, when you need it.     Your next appointment:   KEEP JANUARY APPOINTMENT WITH DR. CRENSHAW  Provider:   Olga Millers, MD

## 2023-01-20 NOTE — Telephone Encounter (Signed)
PT saw Dr. Rexene Edison and he gave her two samples of Spiriva. She wonders if she was to take 2 samples only OR was he sending in a RX for it.  AVS states: For the shortness of breath try Spiriva 2 puffs once a day   Pharm: [facility]   [phone]

## 2023-01-23 ENCOUNTER — Other Ambulatory Visit: Payer: Self-pay | Admitting: Cardiology

## 2023-01-23 DIAGNOSIS — R002 Palpitations: Secondary | ICD-10-CM

## 2023-02-03 DIAGNOSIS — M8580 Other specified disorders of bone density and structure, unspecified site: Secondary | ICD-10-CM | POA: Diagnosis not present

## 2023-02-03 DIAGNOSIS — E039 Hypothyroidism, unspecified: Secondary | ICD-10-CM | POA: Diagnosis not present

## 2023-02-03 DIAGNOSIS — E781 Pure hyperglyceridemia: Secondary | ICD-10-CM | POA: Diagnosis not present

## 2023-02-08 ENCOUNTER — Ambulatory Visit (HOSPITAL_COMMUNITY): Payer: Medicare HMO | Attending: Adult Health

## 2023-02-08 DIAGNOSIS — I2510 Atherosclerotic heart disease of native coronary artery without angina pectoris: Secondary | ICD-10-CM | POA: Diagnosis not present

## 2023-02-08 DIAGNOSIS — I2583 Coronary atherosclerosis due to lipid rich plaque: Secondary | ICD-10-CM | POA: Insufficient documentation

## 2023-02-08 DIAGNOSIS — Z09 Encounter for follow-up examination after completed treatment for conditions other than malignant neoplasm: Secondary | ICD-10-CM | POA: Diagnosis not present

## 2023-02-08 DIAGNOSIS — Z953 Presence of xenogenic heart valve: Secondary | ICD-10-CM | POA: Diagnosis not present

## 2023-02-08 LAB — ECHOCARDIOGRAM COMPLETE
AR max vel: 1.56 cm2
AV Area VTI: 1.68 cm2
AV Area mean vel: 1.61 cm2
AV Mean grad: 11.4 mm[Hg]
AV Peak grad: 22.2 mm[Hg]
Ao pk vel: 2.36 m/s
Area-P 1/2: 3.74 cm2
S' Lateral: 3 cm

## 2023-02-10 ENCOUNTER — Telehealth: Payer: Self-pay

## 2023-02-10 DIAGNOSIS — M1990 Unspecified osteoarthritis, unspecified site: Secondary | ICD-10-CM | POA: Diagnosis not present

## 2023-02-10 DIAGNOSIS — M8580 Other specified disorders of bone density and structure, unspecified site: Secondary | ICD-10-CM | POA: Diagnosis not present

## 2023-02-10 DIAGNOSIS — E7800 Pure hypercholesterolemia, unspecified: Secondary | ICD-10-CM | POA: Diagnosis not present

## 2023-02-10 DIAGNOSIS — I119 Hypertensive heart disease without heart failure: Secondary | ICD-10-CM | POA: Diagnosis not present

## 2023-02-10 DIAGNOSIS — Z0000 Encounter for general adult medical examination without abnormal findings: Secondary | ICD-10-CM | POA: Diagnosis not present

## 2023-02-10 DIAGNOSIS — Z23 Encounter for immunization: Secondary | ICD-10-CM | POA: Diagnosis not present

## 2023-02-10 DIAGNOSIS — K52838 Other microscopic colitis: Secondary | ICD-10-CM | POA: Diagnosis not present

## 2023-02-10 DIAGNOSIS — Z1339 Encounter for screening examination for other mental health and behavioral disorders: Secondary | ICD-10-CM | POA: Diagnosis not present

## 2023-02-10 DIAGNOSIS — I25810 Atherosclerosis of coronary artery bypass graft(s) without angina pectoris: Secondary | ICD-10-CM | POA: Diagnosis not present

## 2023-02-10 DIAGNOSIS — Z1331 Encounter for screening for depression: Secondary | ICD-10-CM | POA: Diagnosis not present

## 2023-02-10 DIAGNOSIS — R82998 Other abnormal findings in urine: Secondary | ICD-10-CM | POA: Diagnosis not present

## 2023-02-10 DIAGNOSIS — E039 Hypothyroidism, unspecified: Secondary | ICD-10-CM | POA: Diagnosis not present

## 2023-02-10 DIAGNOSIS — D692 Other nonthrombocytopenic purpura: Secondary | ICD-10-CM | POA: Diagnosis not present

## 2023-02-10 NOTE — Telephone Encounter (Addendum)
Called patient regarding results. Left detailed message for patient to call office. Letter mailed on 02/10/23.----- Message from Joni Reining sent at 02/09/2023  7:15 AM EDT ----- I have reviewed the echo report. Heart is pumping normally with good strength. The prosthetic valve is functioning normally. Mitral valve has some mild backflow.  No concerns here.

## 2023-02-21 DIAGNOSIS — Z952 Presence of prosthetic heart valve: Secondary | ICD-10-CM | POA: Diagnosis not present

## 2023-02-21 DIAGNOSIS — E7800 Pure hypercholesterolemia, unspecified: Secondary | ICD-10-CM | POA: Diagnosis not present

## 2023-02-21 DIAGNOSIS — I119 Hypertensive heart disease without heart failure: Secondary | ICD-10-CM | POA: Diagnosis not present

## 2023-02-21 DIAGNOSIS — I350 Nonrheumatic aortic (valve) stenosis: Secondary | ICD-10-CM | POA: Diagnosis not present

## 2023-02-21 DIAGNOSIS — F419 Anxiety disorder, unspecified: Secondary | ICD-10-CM | POA: Diagnosis not present

## 2023-02-21 DIAGNOSIS — M1990 Unspecified osteoarthritis, unspecified site: Secondary | ICD-10-CM | POA: Diagnosis not present

## 2023-02-21 DIAGNOSIS — I25810 Atherosclerosis of coronary artery bypass graft(s) without angina pectoris: Secondary | ICD-10-CM | POA: Diagnosis not present

## 2023-02-21 DIAGNOSIS — E039 Hypothyroidism, unspecified: Secondary | ICD-10-CM | POA: Diagnosis not present

## 2023-02-21 DIAGNOSIS — K52838 Other microscopic colitis: Secondary | ICD-10-CM | POA: Diagnosis not present

## 2023-03-16 ENCOUNTER — Telehealth: Payer: Self-pay | Admitting: Pulmonary Disease

## 2023-03-16 NOTE — Telephone Encounter (Signed)
PT states a year and a half ago Dr. Rexene Edison diag a share pain in her side as pleurisy but a few mo ago she returned and he said it may be muscular  Every time she sneezes she has to hold on to something because she gets a very sharp pain in her side. She wonders if there are further tests that can be done like an MRI because it has gone on for about 6 years.  Pls call PT to advise @ [phone]

## 2023-03-20 NOTE — Telephone Encounter (Signed)
Patient calling again to check status of message.

## 2023-03-20 NOTE — Telephone Encounter (Signed)
Pt has concerns of side pain, which was discussed at her last ov. Pt had a cxr that was clear dealing with the lungs. Should the patient talk with her pcp about this. Dr.Hunsucker please advise

## 2023-03-21 NOTE — Telephone Encounter (Signed)
Other than a chest xray - not much more I can offer. Not my area of expertise. I agree - probably best to discuss with her PCP.

## 2023-03-21 NOTE — Telephone Encounter (Signed)
Called and spoke with pt who was advised to talk to her pcp. She verbalized understanding, nfn

## 2023-04-04 ENCOUNTER — Telehealth: Payer: Self-pay | Admitting: Gastroenterology

## 2023-04-04 DIAGNOSIS — I350 Nonrheumatic aortic (valve) stenosis: Secondary | ICD-10-CM | POA: Diagnosis not present

## 2023-04-04 DIAGNOSIS — I25810 Atherosclerosis of coronary artery bypass graft(s) without angina pectoris: Secondary | ICD-10-CM | POA: Diagnosis not present

## 2023-04-04 DIAGNOSIS — I119 Hypertensive heart disease without heart failure: Secondary | ICD-10-CM | POA: Diagnosis not present

## 2023-04-04 DIAGNOSIS — E7800 Pure hypercholesterolemia, unspecified: Secondary | ICD-10-CM | POA: Diagnosis not present

## 2023-04-04 DIAGNOSIS — F419 Anxiety disorder, unspecified: Secondary | ICD-10-CM | POA: Diagnosis not present

## 2023-04-04 DIAGNOSIS — M1990 Unspecified osteoarthritis, unspecified site: Secondary | ICD-10-CM | POA: Diagnosis not present

## 2023-04-04 DIAGNOSIS — K52838 Other microscopic colitis: Secondary | ICD-10-CM | POA: Diagnosis not present

## 2023-04-04 DIAGNOSIS — E039 Hypothyroidism, unspecified: Secondary | ICD-10-CM | POA: Diagnosis not present

## 2023-04-04 DIAGNOSIS — R079 Chest pain, unspecified: Secondary | ICD-10-CM | POA: Diagnosis not present

## 2023-04-04 NOTE — Telephone Encounter (Signed)
PT is experiencing a lot of intestinal pain and feels her ulcer is coming back after battling depression. Requesting to speak to a nurse regarding symptoms.

## 2023-04-05 NOTE — Telephone Encounter (Signed)
Spoke with patient who describes some recent family stressors which she feels is leading to depression and GI symptoms. She states that over the last several weeks, she has been experiencing frequent generalized abdominal cramping as well as loose stools. Loose stools tend to be more prominent directly after eating. No blood noted, no fever. Patient does have a history of microscopic colitis for which she initially took budesonide but has since discontinued (2023). She states that her PCP told her to reach out to GI because she may need additional medications.   Patient has been scheduled to see Hyacinth Meeker, PA-C for evaluation on 04/12/23 and patient is agreeable to this plan.

## 2023-04-07 DIAGNOSIS — Z1231 Encounter for screening mammogram for malignant neoplasm of breast: Secondary | ICD-10-CM | POA: Diagnosis not present

## 2023-04-10 ENCOUNTER — Ambulatory Visit: Payer: Medicare HMO | Admitting: Pulmonary Disease

## 2023-04-12 ENCOUNTER — Encounter: Payer: Self-pay | Admitting: Physician Assistant

## 2023-04-12 ENCOUNTER — Ambulatory Visit: Payer: Medicare HMO | Admitting: Physician Assistant

## 2023-04-12 VITALS — BP 112/60 | HR 79 | Ht 63.0 in | Wt 118.2 lb

## 2023-04-12 DIAGNOSIS — R1030 Lower abdominal pain, unspecified: Secondary | ICD-10-CM | POA: Diagnosis not present

## 2023-04-12 DIAGNOSIS — Z8719 Personal history of other diseases of the digestive system: Secondary | ICD-10-CM

## 2023-04-12 DIAGNOSIS — R195 Other fecal abnormalities: Secondary | ICD-10-CM | POA: Diagnosis not present

## 2023-04-12 DIAGNOSIS — R197 Diarrhea, unspecified: Secondary | ICD-10-CM

## 2023-04-12 MED ORDER — DICYCLOMINE HCL 10 MG PO CAPS: 10.0000 mg | ORAL_CAPSULE | Freq: Two times a day (BID) | ORAL | 3 refills | Status: DC

## 2023-04-12 NOTE — Patient Instructions (Signed)
We have sent the following medications to your pharmacy for you to pick up at your convenience: Bentyl 10 mg twice daily before breakfast and dinner.  _______________________________________________________  If your blood pressure at your visit was 140/90 or greater, please contact your primary care physician to follow up on this.  _______________________________________________________  If you are age 85 or older, your body mass index should be between 23-30. Your Body mass index is 20.95 kg/m. If this is out of the aforementioned range listed, please consider follow up with your Primary Care Provider.  If you are age 82 or younger, your body mass index should be between 19-25. Your Body mass index is 20.95 kg/m. If this is out of the aformentioned range listed, please consider follow up with your Primary Care Provider.   ________________________________________________________  The [place] [facility] providers would like to encourage you to use [facility] to communicate with providers for non-urgent requests or questions.  Due to long hold times on the telephone, sending your provider a message by [facility] may be a faster and more efficient way to get a response.  Please allow 48 business hours for a response.  Please remember that this is for non-urgent requests.    It was a pleasure to see you today!  Thank you for trusting me with your gastrointestinal care!    Hyacinth Meeker, PA-C

## 2023-04-12 NOTE — Progress Notes (Signed)
Chief Complaint: Abdominal cramping and loose stool  HPI:    Linda Parker is an 87 year old female with a past medical history as listed below including depression and anxiety as well as aortic stenosis, known to Dr. Barron Alvine, who was referred to me by Tisovec, Adelfa Koh, MD for a complaint of abdominal cramping and loose stool.    12/30/2021 colonoscopy with one 4 mm polyp in the ascending colon, diverticulosis and otherwise normal.  Biopsies showed microscopic colitis (lymphocytic colitis).    04/05/2023 patient called in and describes some recent family stressors which led to depression and GI symptoms.  Over the past several weeks experiencing frequent generalized abdominal cramping with loose stools.  Worse after eating.  Discussed a history of microscopic colitis for which she initially took some Budesonide but discontinued in 2023.    Today, patient describes that she is having a lot of family drama.  Apparently her husband passed away a year and a half ago and she is having some stress from his 2 grown children.  All of it is making her anxious and she has noticed that over the past 3 to 4 weeks or so whenever she eats it tends to go straight through her.  She also has some "griping pain" in her lower abdomen that seems to come and go, better after a bowel movement at least for a little while.  Her PCP started her on Xanax and she has been taking half a pill of that but it is not really helping with her GI symptoms.  She does tell me that she is having solid stools it is just that they are very soft.  She asked if her lymphocytic colitis is back.    Denies fever, chills or weight loss.    Past Medical History:  Diagnosis Date   Allergy    Anxiety    Aortic stenosis    s/p AVR w/ 23 mm Edward Magna-ease Pericardial Valve   Arthritis    hands & knee- L    Coronary artery disease    Depression    Dysrhythmia    when taking antihistamines   Heart murmur    History of echocardiogram     Echo (8/15): EF 55-60%, normal wall motion, grade 1 diastolic dysfunction, AVR okay (mean 10 mm Hg), normal RV function, moderate TR   Osteoporosis    Palpitations    Unspecified hypothyroidism    Vertigo     Past Surgical History:  Procedure Laterality Date   ABDOMINAL HYSTERECTOMY     AORTIC VALVE REPLACEMENT N/A 09/24/2013   Procedure: AORTIC VALVE REPLACEMENT (AVR) using Pericardial Tissue Valve (size 23mm).;  Surgeon: Alleen Borne, MD;  Location: [facility];  Service: Open Heart Surgery;  Laterality: N/A;   APPENDECTOMY     at time of hysterectomy   bilateral knee surgery  35 yrs. ago   cartilage removed from knees   CARDIAC CATHETERIZATION     CARDIAC VALVE REPLACEMENT     CORONARY ARTERY BYPASS GRAFT N/A 09/24/2013   Procedure: CORONARY ARTERY BYPASS GRAFTING (CABG) x4: LIMA to LAD, SVG to Diagonal 1, SVG to Diagonal 2, Svg to OM 1.;  Surgeon: Alleen Borne, MD;  Location: [facility] OR;  Service: Open Heart Surgery;  Laterality: N/A;   FINGER SURGERY     left hand   INJECTION KNEE Left 03/04/2013   Procedure: left KNEE cortisone INJECTION;  Surgeon: Loanne Drilling, MD;  Location: [facility];  Service: Orthopedics;  Laterality: Left;  INTRAOPERATIVE TRANSESOPHAGEAL ECHOCARDIOGRAM N/A 09/24/2013   Procedure: INTRAOPERATIVE TRANSESOPHAGEAL ECHOCARDIOGRAM;  Surgeon: Alleen Borne, MD;  [facility];  Service: Open Heart Surgery;  Laterality: N/A;   LEFT AND RIGHT HEART CATHETERIZATION WITH CORONARY ANGIOGRAM N/A 09/17/2013   Procedure: LEFT AND RIGHT HEART CATHETERIZATION WITH CORONARY ANGIOGRAM;  Surgeon: Micheline Chapman, MD;  Location: [facility] CATH LAB;  Service: Cardiovascular;  Laterality: N/A;   MASS EXCISION Right 05/17/2018   Procedure: EXCISION CYST RIGHT RING FINGER, DEBRIDEMENT DISTAL INTERPHALANGEAL RIGHT FINGER;  Surgeon: Cindee Salt, MD;  Location: [facility];  Service: Orthopedics;  Laterality: Right;   MASS EXCISION Right 07/14/2020   Procedure: EXCISION CYST, DEBRIDEMENT  DISTAL INTERPHALANGEAL JOINT RIGHT INDEX FINGER;  Surgeon: Cindee Salt, MD;  Location: [facility];  Service: Orthopedics;  Laterality: Right;  IV REGIONAL FOREARM BLOCK; 60 MINUTES   TONSILLECTOMY     TOTAL KNEE ARTHROPLASTY Right 03/04/2013   Procedure: RIGHT TOTAL KNEE ARTHROPLASTY;  Surgeon: Loanne Drilling, MD;  Location: [facility];  Service: Orthopedics;  Laterality: Right;    Current Outpatient Medications  Medication Sig Dispense Refill   acetaminophen (TYLENOL) 325 MG tablet Take 650 mg by mouth every 6 (six) hours as needed.     acyclovir (ZOVIRAX) 400 MG tablet Take 400 mg by mouth 2 (two) times daily as needed (for cold sore). As needed     ALPRAZolam (XANAX) 0.25 MG tablet Take 0.5 tablets by mouth daily.     Ascorbic Acid (VITAMIN C) POWD Take by mouth. Taking 1/4 teaspoon daily     aspirin 81 MG tablet Take 81 mg by mouth daily.     cholecalciferol (VITAMIN D) 1000 UNITS tablet Take 2,000 Units by mouth daily.      CINNAMON PO Take by mouth. Takes 1/4 teaspoon daily     Cyanocobalamin (VITAMIN B-12 PO) Take by mouth as directed.     estradiol (ESTRACE) 0.5 MG tablet Take 0.5 mg by mouth daily before breakfast.      fluticasone (FLONASE) 50 MCG/ACT nasal spray Place 1 spray into both nostrils as needed.     Glucosamine-Chondroitin (GLUCOSAMINE CHONDR COMPLEX PO) Take 1 tablet by mouth 2 (two) times daily.     hydrocortisone 2.5 % ointment Apply 1 Application topically as needed.     influenza vac split trivalent (FLUZONE) injection Inject into the muscle.     levothyroxine (SYNTHROID) 25 MCG tablet Take by mouth.     lovastatin (MEVACOR) 20 MG tablet TAKE 1 TABLET AT BEDTIME (Patient taking differently: Take 20 mg by mouth every other day.) 90 tablet 10   metoprolol succinate (TOPROL-XL) 25 MG 24 hr tablet Take 25 mg by mouth daily.     metoprolol tartrate (LOPRESSOR) 25 MG tablet TAKE 1/2 TABLET TWICE DAILY 90 tablet 3   omega-3 acid ethyl esters (LOVAZA) 1 G  capsule Take 1 g by mouth 2 (two) times daily.     traMADol (ULTRAM) 50 MG tablet Take 1 tablet (50 mg total) by mouth every 6 (six) hours as needed. 20 tablet 0   vitamin E 400 UNIT capsule Take 400 Units by mouth daily.     VITAMIN K PO Take by mouth.     folic acid (FOLVITE) 800 MCG tablet Take 400 mcg by mouth daily. (Patient not taking: Reported on 01/20/2023)     Current Facility-Administered Medications  Medication Dose Route Frequency Provider Last Rate Last Admin   0.9 %  sodium chloride infusion  500 mL  Intravenous Once Cirigliano, Vito V, DO        Allergies as of 04/12/2023 - Review Complete 04/12/2023  Allergen Reaction Noted   Erythromycin Nausea Only    Erythromycin base Other (See Comments) 10/23/2017   Monosodium glutamate Other (See Comments) 09/20/2013   Nitrates, organic  09/20/2013   Sulfamethoxazole Other (See Comments) 10/23/2017   Sulfonamide derivatives Nausea Only    Ciprofloxacin Palpitations 02/27/2016   Versed [midazolam] Other (See Comments) 05/10/2018    Family History  Problem Relation Age of Onset   Heart disease Mother    Heart failure Mother    Esophageal cancer Father    Heart disease Father    Heart disease Sister    Hyperlipidemia Sister    Heart disease Sister    Heart disease Brother    ALS Brother     Social History   Socioeconomic History   Marital status: Married    Spouse name: Not on file   Number of children: 3   Years of education: Not on file   Highest education level: Not on file  Occupational History   Occupation: retired  Tobacco Use   Smoking status: Never   Smokeless tobacco: Never  Vaping Use   Vaping status: Never Used  Substance and Sexual Activity   Alcohol use: Yes    Alcohol/week: 1.0 standard drink of alcohol    Types: 1 Glasses of wine per week    Comment: 4 oz red wine periodically   Drug use: No   Sexual activity: Not Currently    Birth control/protection: Post-menopausal, Surgical  Other Topics  Concern   Not on file  Social History Narrative   Not on file   Social Drivers of Health   Financial Resource Strain: Not on file  Food Insecurity: Not on file  Transportation Needs: Not on file  Physical Activity: Not on file  Stress: Not on file  Social Connections: Not on file  Intimate Partner Violence: Not on file    Review of Systems:    Constitutional: No weight loss, fever or chills Skin: No rash  Cardiovascular: No chest pain Respiratory: No SOB  Gastrointestinal: See HPI and otherwise negative Genitourinary: No dysuria Neurological: No headache, dizziness or syncope Musculoskeletal: No new muscle or joint pain Hematologic: No bleeding  Psychiatric: +anxiety    Physical Exam:  Vital signs: BP 112/60   Pulse 79   Ht 5\' 3"  (1.6 m)   Wt 118 lb 4 oz (53.6 kg)   SpO2 98%   BMI 20.95 kg/m    Constitutional:   Pleasant Elderly Caucasian female appears to be in NAD, Well developed, Well nourished, alert and cooperative Respiratory: Respirations even and unlabored. Lungs clear to auscultation bilaterally.   No wheezes, crackles, or rhonchi.  Cardiovascular: Normal S1, S2. No MRG. Regular rate and rhythm. No peripheral edema, cyanosis or pallor.  Gastrointestinal:  Soft, nondistended, nontender. No rebound or guarding.  Increased bowel sounds all 4 quadrants. No appreciable masses or hepatomegaly. Rectal:  Not performed.  Psychiatric: Demonstrates good judgement and reason without abnormal affect or behaviors.  RELEVANT LABS AND IMAGING: CBC    Component Value Date/Time   WBC 8.5 12/18/2021 1206   RBC 3.75 (L) 12/18/2021 1206   HGB 12.7 12/18/2021 1206   HCT 36.8 12/18/2021 1206   PLT 287 12/18/2021 1206   MCV 98.1 12/18/2021 1206   MCH 33.9 12/18/2021 1206   MCHC 34.5 12/18/2021 1206   RDW 12.8 12/18/2021 1206   LYMPHSABS  1.2 12/18/2021 1206   MONOABS 1.2 (H) 12/18/2021 1206   EOSABS 0.0 12/18/2021 1206   BASOSABS 0.0 12/18/2021 1206    CMP      Component Value Date/Time   NA 135 12/18/2021 1206   K 3.9 12/18/2021 1206   CL 100 12/18/2021 1206   CO2 24 12/18/2021 1206   GLUCOSE 98 12/18/2021 1206   BUN 18 12/18/2021 1206   CREATININE 0.61 12/18/2021 1206   CALCIUM 9.0 12/18/2021 1206   PROT 6.8 12/18/2021 1206   PROT 6.8 07/08/2019 1033   ALBUMIN 4.1 12/18/2021 1206   ALBUMIN 4.5 07/08/2019 1033   AST 15 12/18/2021 1206   ALT 14 12/18/2021 1206   ALKPHOS 69 12/18/2021 1206   BILITOT 0.4 12/18/2021 1206   BILITOT 0.4 07/08/2019 1033   GFRNONAA >60 12/18/2021 1206   GFRAA >90 09/27/2013 0413    Assessment: 1.  Lower abdominal cramping with loose stools: I suspect the patient's symptoms now are related more to IBS given her current anxiety state 2.  History of lymphocytic colitis  Plan: 1.  Prescribed Dicyclomine 10 mg twice daily, 20-30 minutes for breakfast and dinner.  #60 with 2 refills. 2.  Patient is having soft solid stools, no need for stool studies. 3.  If above does not help over the next couple of weeks patient will call.  At that time would discontinue the Dicyclomine and do a trial of Budesonide taper for known lymphocytic colitis. 4.  Patient to follow in clinic with [place] as needed.  Hyacinth Meeker, PA-C [facility] 04/12/2023, 2:58 PM  Cc: Tisovec, Adelfa Koh, MD

## 2023-04-18 ENCOUNTER — Other Ambulatory Visit: Payer: Self-pay | Admitting: Cardiology

## 2023-04-20 NOTE — Progress Notes (Signed)
 HPI: FU AVR and CAD. Echocardiogram in May 2015 demonstrated severe aortic stenosis. She was symptomatic and referred for cardiac catheterization. This demonstrated 3 vessel CAD. She was referred for CABG and AVR. She was admitted 6/15 and underwent CABG (LIMA-LAD, SVG-D1, SVG-D2, SVG-OM1) and bioprosthetic AVR (23 mm Edwards magna-ease pericardial valve). Note preoperative carotid Dopplers May 2015 showed bilateral 1-39% stenosis.  CTA October 2022 showed coronary and aortic atherosclerosis and borderline dilatation of the aortic arch at 3.1 cm.  Follow-up echocardiogram October 2024 showed normal LV function, mild left ventricular hypertrophy, mild RV dysfunction, mild left atrial enlargement, mild mitral regurgitation, mild to moderate tricuspid regurgitation, status post aortic valve replacement with no aortic insufficiency, mean gradient 11.4 mmHg.  Since last seen, there is no dyspnea, chest pain, palpitations or syncope.  She does have some fatigue.  Current Outpatient Medications  Medication Sig Dispense Refill   acetaminophen  (TYLENOL ) 325 MG tablet Take 650 mg by mouth every 6 (six) hours as needed.     acyclovir (ZOVIRAX) 400 MG tablet Take 400 mg by mouth 2 (two) times daily as needed (for cold sore). As needed     ALPRAZolam  (XANAX ) 0.25 MG tablet Take 0.5 tablets by mouth daily.     Ascorbic Acid (VITAMIN C) POWD Take by mouth. Taking 1/4 teaspoon daily     aspirin  81 MG tablet Take 81 mg by mouth daily.     cholecalciferol (VITAMIN D) 1000 UNITS tablet Take 2,000 Units by mouth daily.      CINNAMON PO Take by mouth. Takes 1/4 teaspoon daily     Cyanocobalamin  (VITAMIN B-12 PO) Take by mouth as directed.     dicyclomine  (BENTYL ) 10 MG capsule Take 1 capsule (10 mg total) by mouth 2 (two) times daily. Take 1 capsule before breakfast and dinner, 60 capsule 3   estradiol  (ESTRACE ) 0.5 MG tablet Take 0.5 mg by mouth daily before breakfast.      fluticasone (FLONASE) 50 MCG/ACT nasal  spray Place 1 spray into both nostrils as needed.     folic acid  (FOLVITE ) 800 MCG tablet Take 400 mcg by mouth daily.     Glucosamine-Chondroitin (GLUCOSAMINE CHONDR COMPLEX PO) Take 1 tablet by mouth 2 (two) times daily.     hydrocortisone 2.5 % ointment Apply 1 Application topically as needed.     influenza vac split trivalent (FLUZONE) injection Inject into the muscle.     levothyroxine  (SYNTHROID ) 25 MCG tablet Take by mouth.     lovastatin  (MEVACOR ) 20 MG tablet TAKE 1 TABLET AT BEDTIME 30 tablet 0   metoprolol  succinate (TOPROL -XL) 25 MG 24 hr tablet Take 25 mg by mouth daily.     metoprolol  tartrate (LOPRESSOR ) 25 MG tablet TAKE 1/2 TABLET TWICE DAILY 90 tablet 3   omega-3 acid ethyl esters (LOVAZA) 1 G capsule Take 1 g by mouth 2 (two) times daily.     traMADol  (ULTRAM ) 50 MG tablet Take 1 tablet (50 mg total) by mouth every 6 (six) hours as needed. 20 tablet 0   vitamin E 400 UNIT capsule Take 400 Units by mouth daily.     VITAMIN K PO Take by mouth.     Current Facility-Administered Medications  Medication Dose Route Frequency Provider Last Rate Last Admin   0.9 %  sodium chloride  infusion  500 mL Intravenous Once Cirigliano, Vito V, DO         Past Medical History:  Diagnosis Date   Allergy    Anxiety  Aortic stenosis    s/p AVR w/ 23 mm Edward Magna-ease Pericardial Valve   Arthritis    hands & knee- L    Coronary artery disease    Depression    Dysrhythmia    when taking antihistamines   Heart murmur    History of echocardiogram    Echo (8/15): EF 55-60%, normal wall motion, grade 1 diastolic dysfunction, AVR okay (mean 10 mm Hg), normal RV function, moderate TR   Osteoporosis    Palpitations    Unspecified hypothyroidism    Vertigo     Past Surgical History:  Procedure Laterality Date   ABDOMINAL HYSTERECTOMY     AORTIC VALVE REPLACEMENT N/A 09/24/2013   Procedure: AORTIC VALVE REPLACEMENT (AVR) using Pericardial Tissue Valve (size 23mm).;  Surgeon: Dorise MARLA Fellers, MD;  Location: [facility] OR;  Service: Open Heart Surgery;  Laterality: N/A;   APPENDECTOMY     at time of hysterectomy   bilateral knee surgery  35 yrs. ago   cartilage removed from knees   CARDIAC CATHETERIZATION     CARDIAC VALVE REPLACEMENT     CORONARY ARTERY BYPASS GRAFT N/A 09/24/2013   Procedure: CORONARY ARTERY BYPASS GRAFTING (CABG) x4: LIMA to LAD, SVG to Diagonal 1, SVG to Diagonal 2, Svg to OM 1.;  Surgeon: Dorise MARLA Fellers, MD;  Location: [facility] OR;  Service: Open Heart Surgery;  Laterality: N/A;   FINGER SURGERY     left hand   INJECTION KNEE Left 03/04/2013   Procedure: left KNEE cortisone INJECTION;  Surgeon: Dempsey LULLA Moan, MD;  Location: [facility];  Service: Orthopedics;  Laterality: Left;   INTRAOPERATIVE TRANSESOPHAGEAL ECHOCARDIOGRAM N/A 09/24/2013   Procedure: INTRAOPERATIVE TRANSESOPHAGEAL ECHOCARDIOGRAM;  Surgeon: Dorise MARLA Fellers, MD;  Location: [facility] OR;  Service: Open Heart Surgery;  Laterality: N/A;   LEFT AND RIGHT HEART CATHETERIZATION WITH CORONARY ANGIOGRAM N/A 09/17/2013   Procedure: LEFT AND RIGHT HEART CATHETERIZATION WITH CORONARY ANGIOGRAM;  Surgeon: Ozell JONETTA Fell, MD;  Location: [facility] CATH LAB;  Service: Cardiovascular;  Laterality: N/A;   MASS EXCISION Right 05/17/2018   Procedure: EXCISION CYST RIGHT RING FINGER, DEBRIDEMENT DISTAL INTERPHALANGEAL RIGHT FINGER;  Surgeon: Murrell Kuba, MD;  Location: [facility];  Service: Orthopedics;  Laterality: Right;   MASS EXCISION Right 07/14/2020   Procedure: EXCISION CYST, DEBRIDEMENT DISTAL INTERPHALANGEAL JOINT RIGHT INDEX FINGER;  Surgeon: Murrell Kuba, MD;  Location: [facility];  Service: Orthopedics;  Laterality: Right;  IV REGIONAL FOREARM BLOCK; 60 MINUTES   TONSILLECTOMY     TOTAL KNEE ARTHROPLASTY Right 03/04/2013   Procedure: RIGHT TOTAL KNEE ARTHROPLASTY;  Surgeon: Dempsey LULLA Moan, MD;  Location: [facility];  Service: Orthopedics;  Laterality: Right;    Social History   Socioeconomic History    Marital status: Married    Spouse name: Not on file   Number of children: 3   Years of education: Not on file   Highest education level: Not on file  Occupational History   Occupation: retired  Tobacco Use   Smoking status: Never   Smokeless tobacco: Never  Vaping Use   Vaping status: Never Used  Substance and Sexual Activity   Alcohol  use: Yes    Alcohol /week: 1.0 standard drink of alcohol     Types: 1 Glasses of wine per week    Comment: 4 oz red wine periodically   Drug use: No   Sexual activity: Not Currently    Birth control/protection: Post-menopausal, Surgical  Other Topics Concern   Not  on file  Social History Narrative   Not on file   Social Drivers of Health   Financial Resource Strain: Not on file  Food Insecurity: Not on file  Transportation Needs: Not on file  Physical Activity: Not on file  Stress: Not on file  Social Connections: Not on file  Intimate Partner Violence: Not on file    Family History  Problem Relation Age of Onset   Heart disease Mother    Heart failure Mother    Esophageal cancer Father    Heart disease Father    Heart disease Sister    Hyperlipidemia Sister    Heart disease Sister    Heart disease Brother    ALS Brother     ROS: Patient complains of IBS and depression but no fevers or chills, productive cough, hemoptysis, dysphasia, odynophagia, melena, hematochezia, dysuria, hematuria, rash, seizure activity, orthopnea, PND, pedal edema, claudication. Remaining systems are negative.  Physical Exam: Well-developed well-nourished in no acute distress.  Skin is warm and dry.  HEENT is normal.  Neck is supple.  Chest is clear to auscultation with normal expansion.  Cardiovascular exam is regular rate and rhythm.  2/6 systolic murmur left sternal border. Abdominal exam nontender or distended. No masses palpated. Extremities show no edema. neuro grossly intact  A/P  1 coronary artery disease status post coronary bypass and  graft-patient denies chest pain.  Continue aspirin  and statin.  2 status post aortic valve replacement-continue SBE prophylaxis.  Most recent echocardiogram showed normally functioning prosthetic aortic valve.  3 hyperlipidemia-continue statin.  Lipids and liver monitored by primary care.  4 palpitations-she denies any recent symptoms.  Continue toprol .  Redell Shallow, MD

## 2023-04-27 ENCOUNTER — Telehealth: Payer: Self-pay | Admitting: Physician Assistant

## 2023-04-27 NOTE — Telephone Encounter (Signed)
 Inbound call from patient, states bentyl is causing "weird" side effects and she does not wish to continue the medication. She would like to discuss more options.

## 2023-04-28 NOTE — Telephone Encounter (Addendum)
 Called patient in reference to complaints about Bentyl  medication causing drowsiness, tiredness, nausea and reflux. Patient states she would rather not take medication and wants to know if it can be controlled with diet. Informed the patient that diet control helps in refraining from foods that irritate ulcerative colitis. Patient also states she noted that most of the foods she likes are the foods that she is not supposed to eat. But wants to know if she can control the abdominal discomfort via diet control.

## 2023-04-28 NOTE — Telephone Encounter (Signed)
 Called to inform patient of Ms. Linda Parker recommendations in reference to the Low FODMAP diet and OTC IB Gard. Informed the patient the FODMAP diet can also be found on the internet per google. Patient in agreement.  Thank you Bayley

## 2023-05-02 ENCOUNTER — Ambulatory Visit: Payer: Medicare HMO | Attending: Cardiology | Admitting: Cardiology

## 2023-05-02 ENCOUNTER — Encounter: Payer: Self-pay | Admitting: Cardiology

## 2023-05-02 VITALS — BP 108/54 | HR 78 | Ht 63.0 in | Wt 113.0 lb

## 2023-05-02 DIAGNOSIS — I2510 Atherosclerotic heart disease of native coronary artery without angina pectoris: Secondary | ICD-10-CM | POA: Diagnosis not present

## 2023-05-02 DIAGNOSIS — I2583 Coronary atherosclerosis due to lipid rich plaque: Secondary | ICD-10-CM | POA: Diagnosis not present

## 2023-05-02 DIAGNOSIS — R002 Palpitations: Secondary | ICD-10-CM | POA: Diagnosis not present

## 2023-05-02 DIAGNOSIS — I10 Essential (primary) hypertension: Secondary | ICD-10-CM | POA: Diagnosis not present

## 2023-05-02 DIAGNOSIS — E7800 Pure hypercholesterolemia, unspecified: Secondary | ICD-10-CM

## 2023-05-02 DIAGNOSIS — Z953 Presence of xenogenic heart valve: Secondary | ICD-10-CM

## 2023-05-02 NOTE — Patient Instructions (Signed)
   Follow-Up: At [facility], you and your health needs are our priority.  As part of our continuing mission to provide you with exceptional heart care, we have created designated Provider Care Teams.  These Care Teams include your primary Cardiologist (physician) and Advanced Practice Providers (APPs -  Physician Assistants and Nurse Practitioners) who all work together to provide you with the care you need, when you need it.  We recommend signing up for the patient portal called "MyChart".  Sign up information is provided on this After Visit Summary.  MyChart is used to connect with patients for Virtual Visits (Telemedicine).  Patients are able to view lab/test results, encounter notes, upcoming appointments, etc.  Non-urgent messages can be sent to your provider as well.   To learn more about what you can do with MyChart, go to [email or web address].    Your next appointment:   6 month(s)  Provider:   Olga Millers, MD

## 2023-05-03 DIAGNOSIS — R079 Chest pain, unspecified: Secondary | ICD-10-CM | POA: Diagnosis not present

## 2023-05-21 NOTE — Progress Notes (Signed)
Agree with the assessment and plan as outlined by Hyacinth Meeker, PA-C. ? ?Orman Matsumura, DO, FACG ? ?

## 2023-05-25 ENCOUNTER — Telehealth: Payer: Self-pay | Admitting: Cardiology

## 2023-05-25 DIAGNOSIS — H5000 Unspecified esotropia: Secondary | ICD-10-CM | POA: Diagnosis not present

## 2023-05-25 NOTE — Telephone Encounter (Signed)
Called and spoke to patient. Verified name and DOB.Patient report Monday and Tuesday night after going to bed she had an episode of where she felt like her heart was thumping/pounding in her chest. She also felt like her heart was skipping a beat. She stated that she has never had that feeling before and hasn't had it since Tuesday night. She deny CP, SOB, dizziness or any other symptoms. She reports the episode lasted about 1 minute. Patient was advised if she continue to have symptoms or new symptoms develop that she should go to the ED for evaluation. Patient verbalized understanding and agree.

## 2023-05-25 NOTE — Telephone Encounter (Signed)
Patient c/o Palpitations:  STAT if patient reporting lightheadedness, shortness of breath, or chest pain  How long have you had palpitations/irregular HR/ Afib? Are you having the symptoms now? Patient said she had an episode on Monday and Tuesday night- only happen at nigh- said her heart was thumping in her  chest Are you currently experiencing lightheadedness, SOB or CP? no  Do you have a history of afib (atrial fibrillation) or irregular heart rhythm?   Have you checked your BP or HR? (document readings if available):   Are you experiencing any other symptoms? no

## 2023-05-25 NOTE — Telephone Encounter (Signed)
  May 25, 2023 Lewayne Bunting, MD to Freddi Starr, RN     05/25/23 10:56 AM Continue to follow.  Can consider monitor if symptoms recur. Olga Millers, MD  Advised patient, verbalized understanding

## 2023-05-29 ENCOUNTER — Telehealth: Payer: Self-pay | Admitting: Cardiology

## 2023-05-29 NOTE — Telephone Encounter (Signed)
Pt would like a c/b regarding what she is able to take for decongestant at night. Please advise

## 2023-05-29 NOTE — Telephone Encounter (Signed)
Linda Parker, RPH  Cv Div Nl Triage31 minutes ago (1:57 PM)   Recommend Mucinex   Spoke with patient. She reports she has been using a nasal decongestant OTC for about 1.5 years -- she has been taking [employer] brand Afrin every night. Explained that this can cause rebound congestion and should only be taken about 3 days in a row. She uses  a humidifier.   She said her "head is open during the day" but has "trouble breathing thru the nose at night". She has flonase but does not use. Advised she should start this -- but this can take a while to build up. She does not take a daily antihistamine - advised she could try to incorporate this into her routine.   Advised if symptoms not improved, resolved -- maybe see ENT.

## 2023-06-05 ENCOUNTER — Other Ambulatory Visit: Payer: Self-pay | Admitting: Cardiology

## 2023-06-16 DIAGNOSIS — H524 Presbyopia: Secondary | ICD-10-CM | POA: Diagnosis not present

## 2023-06-26 ENCOUNTER — Telehealth: Payer: Self-pay | Admitting: Cardiology

## 2023-06-26 MED ORDER — METOPROLOL SUCCINATE ER 25 MG PO TB24: 25.0000 mg | ORAL_TABLET | Freq: Every day | ORAL | 3 refills | Status: AC

## 2023-06-26 NOTE — Telephone Encounter (Signed)
*  STAT* If patient is at the pharmacy, call can be transferred to refill team.   1. Which medications need to be refilled? (please list name of each medication and dose if known) metoprolol succinate (TOPROL-XL) 25 MG 24 hr tablet    2. Would you like to learn more about the convenience, safety, & potential cost savings by using the [facility]? No   3. Are you open to using the [facility] (Type [facility].) No   4. Which pharmacy/location (including street and city if local pharmacy) is medication to be sent to? [facility] - [place], [place] - [address]    5. Do they need a 30 day or 90 day supply? 10 day  Pt states that she lost medication while moving

## 2023-06-28 DIAGNOSIS — H40013 Open angle with borderline findings, low risk, bilateral: Secondary | ICD-10-CM | POA: Diagnosis not present

## 2023-08-09 ENCOUNTER — Telehealth: Payer: Self-pay | Admitting: *Deleted

## 2023-08-09 ENCOUNTER — Telehealth: Payer: Self-pay | Admitting: Cardiology

## 2023-08-09 ENCOUNTER — Telehealth: Payer: Self-pay | Admitting: Physician Assistant

## 2023-08-09 NOTE — Telephone Encounter (Signed)
 Inbound call from patient requesting to speak with nurse, she states she has IBS and every time  that she eats and gets really nauseous and has to lay down for about 30 minute. Patient has been taking IBgard and is wanting to discuss if she needs another EGD and colonoscopy. Please advise.

## 2023-08-09 NOTE — Telephone Encounter (Signed)
 Copied from CRM [id number]. Topic: Clinical - Medical Advice >> Aug 09, 2023  9:13 AM Crist Dominion wrote: Reason for CRM: Patient is requesting to speak with Dr. Ames Justin nurse regarding the pain she has been having in her left side for over 2 years. Patient states Dr. Marygrace Snellen originally told her it was inflammation around her lungs but it was not and then was advised to do pulmonary rehab for a pulled muscle but after evaluation that was ruled out as well. Left side is still hurting and patient is requesting an MRI or CT.  I called and spoke with the pt  She states that she continues to have left side pain since the last visit  Denies any respiratory co's She states that she has spoken to PCP about this  They had rec that she ortho and she did back in Nov 2024 but not since  I urged to to f/u with her PCP  She verbalized understanding  Nothing further needed

## 2023-08-09 NOTE — Telephone Encounter (Signed)
 This is a Dr Karene Oto pt last seen 03/2023 by Bridgette Campus.  If this should come back to Pod C let me know.

## 2023-08-09 NOTE — Telephone Encounter (Signed)
 Patient identification verified by 2 forms. Hilton Lucky, RN    Called and spoke to patient  Patient states:   -she is extremely tired   -she has to lay down for 45 minutes after eating a meal   -her heart is fluttering sporadically   -fluttering occurs 3-4 times a day   -does not check BP and heart rate   -takes Metoprolol 25 mg every night  -unsure if tiredness is coming from valve issue  Patient denies:   -lightheadedness/dizziness  -SOB/difficulty breathing   -recent weight gain Patient scheduled for OV 4/18 at 3:20pm with Dr. Rolm Clos  Reviewed ED warning signs/precautions  Patient verbalized understanding, no questions at this time

## 2023-08-09 NOTE — Telephone Encounter (Signed)
 Patient c/o Palpitations:  STAT if patient reporting lightheadedness, shortness of breath, or chest pain  How long have you had palpitations/irregular HR/ Afib? Are you having the symptoms now? Yes  Are you currently experiencing lightheadedness, SOB or CP? No   Do you have a history of afib (atrial fibrillation) or irregular heart rhythm? Yes   Have you checked your BP or HR? (document readings if available): no   Are you experiencing any other symptoms? Very tired

## 2023-08-09 NOTE — Telephone Encounter (Signed)
 Returned call to patient. Patient reports that she has IBS and has been feeling nauseated after eating. Rest is the only thing that helps. Pt states that she wakes up about 4:30 am to take her Thyroid medication then falls back asleep. Patient has been taking IB Drucella Georgi but not 3 times a day. Pt states that Bentyl made her nausea worse. Patient has modified her diet, she avoids a lot of roughage and dairy. I advised patient that she needs updated evaluation before they decide if she needs repeat procedures. Patient has been scheduled for a follow up with Santina Cull, PA on 08/17/23 at 1:30 pm. Pt verbalized understanding and had no concerns at the end of the call.

## 2023-08-10 NOTE — Progress Notes (Deleted)
  Cardiology Office Note:  .   Date:  08/10/2023  ID:  Linda Parker, DOB 1935-05-01, MRN [id number] PCP: Suzzanne Estrin, MD  [facility] Providers Cardiologist:  Alexandria Angel, MD { Click to update primary MD,subspecialty MD or APP then REFRESH:1}   History of Present Illness: .   No chief complaint on file.   Linda Parker is a 88 y.o. female with history of CAD s/p CABG, AVR who presents for follow-up.      Problem List CAD -CABG x 3 (2015) 2. AVR -23 Magna Eease 2015    ROS: All other ROS reviewed and negative. Pertinent positives noted in the HPI.     Studies Reviewed: Aaron Aas       TTE 02/08/2023  1. Left ventricular ejection fraction, by estimation, is 55 to 60%. Left  ventricular ejection fraction by 3D volume is 54 %. The left ventricle has  normal function. The left ventricle has no regional wall motion  abnormalities. There is mild left  ventricular hypertrophy. Left ventricular diastolic parameters are  indeterminate.   2. Right ventricular systolic function is mildly reduced. The right  ventricular size is normal. There is normal pulmonary artery systolic  pressure. The estimated right ventricular systolic pressure is 23.2 mmHg.   3. Left atrial size was mildly dilated.   4. The mitral valve is degenerative. Mild mitral valve regurgitation. No  evidence of mitral stenosis. Moderate mitral annular calcification.   5. Tricuspid valve regurgitation is mild to moderate.   6. The aortic valve has been repaired/replaced. Aortic valve  regurgitation is not visualized. There is a 23 mm [facility] Ease  pericardial valve present in the aortic position. Procedure Date:  09/24/2013. Echo findings are consistent with normal  structure and function of the aortic valve prosthesis. Aortic valve area,  by VTI measures 1.68 cm. Aortic valve mean gradient measures 11.4 mmHg.  Aortic valve Vmax measures 2.36 m/s. Aortic valve acceleration time  measures 81 msec.   7.  Ascending aorta measurements are within normal limits for age when  indexed to body surface area.   8. The inferior vena cava is normal in size with greater than 50%  respiratory variability, suggesting right atrial pressure of 3 mmHg.  Physical Exam:   VS:  There were no vitals taken for this visit.   Wt Readings from Last 3 Encounters:  05/02/23 113 lb (51.3 kg)  04/12/23 118 lb 4 oz (53.6 kg)  01/20/23 117 lb 3.2 oz (53.2 kg)    GEN: Well nourished, well developed in no acute distress NECK: No JVD; No carotid bruits CARDIAC: ***RRR, no murmurs, rubs, gallops RESPIRATORY:  Clear to auscultation without rales, wheezing or rhonchi  ABDOMEN: Soft, non-tender, non-distended EXTREMITIES:  No edema; No deformity  ASSESSMENT AND PLAN: .   ***    {Are you ordering a CV Procedure (e.g. stress test, cath, DCCV, TEE, etc)?   Press F2        :[id number]}   Follow-up: No follow-ups on file.  Time Spent with Patient: I have spent a total of *** minutes caring for this patient today face to face, ordering and reviewing labs/tests, reviewing prior records/medical history, examining the patient, establishing an assessment and plan, communicating results/findings to the patient/family, and documenting in the medical record.   Signed, Gigi Kyle. Rolm Clos, MD, [facility]  [facility]  [address] [place], [place] [address] [phone]  10:05 PM

## 2023-08-11 ENCOUNTER — Ambulatory Visit: Attending: Cardiovascular Disease | Admitting: Cardiovascular Disease

## 2023-08-11 DIAGNOSIS — Z953 Presence of xenogenic heart valve: Secondary | ICD-10-CM

## 2023-08-11 DIAGNOSIS — E7800 Pure hypercholesterolemia, unspecified: Secondary | ICD-10-CM

## 2023-08-11 DIAGNOSIS — I10 Essential (primary) hypertension: Secondary | ICD-10-CM

## 2023-08-11 DIAGNOSIS — R0602 Shortness of breath: Secondary | ICD-10-CM

## 2023-08-11 DIAGNOSIS — I2510 Atherosclerotic heart disease of native coronary artery without angina pectoris: Secondary | ICD-10-CM

## 2023-08-14 DIAGNOSIS — R079 Chest pain, unspecified: Secondary | ICD-10-CM | POA: Diagnosis not present

## 2023-08-14 DIAGNOSIS — I350 Nonrheumatic aortic (valve) stenosis: Secondary | ICD-10-CM | POA: Diagnosis not present

## 2023-08-14 DIAGNOSIS — E7800 Pure hypercholesterolemia, unspecified: Secondary | ICD-10-CM | POA: Diagnosis not present

## 2023-08-14 DIAGNOSIS — F419 Anxiety disorder, unspecified: Secondary | ICD-10-CM | POA: Diagnosis not present

## 2023-08-14 DIAGNOSIS — Z952 Presence of prosthetic heart valve: Secondary | ICD-10-CM | POA: Diagnosis not present

## 2023-08-14 DIAGNOSIS — I25810 Atherosclerosis of coronary artery bypass graft(s) without angina pectoris: Secondary | ICD-10-CM | POA: Diagnosis not present

## 2023-08-14 DIAGNOSIS — I119 Hypertensive heart disease without heart failure: Secondary | ICD-10-CM | POA: Diagnosis not present

## 2023-08-17 ENCOUNTER — Ambulatory Visit: Admitting: Physician Assistant

## 2023-08-17 ENCOUNTER — Other Ambulatory Visit: Payer: Self-pay

## 2023-08-17 ENCOUNTER — Other Ambulatory Visit (INDEPENDENT_AMBULATORY_CARE_PROVIDER_SITE_OTHER)

## 2023-08-17 ENCOUNTER — Encounter: Payer: Self-pay | Admitting: Physician Assistant

## 2023-08-17 VITALS — BP 122/64 | HR 81 | Ht 63.0 in | Wt 106.0 lb

## 2023-08-17 DIAGNOSIS — K52832 Lymphocytic colitis: Secondary | ICD-10-CM

## 2023-08-17 DIAGNOSIS — I359 Nonrheumatic aortic valve disorder, unspecified: Secondary | ICD-10-CM

## 2023-08-17 DIAGNOSIS — K219 Gastro-esophageal reflux disease without esophagitis: Secondary | ICD-10-CM

## 2023-08-17 DIAGNOSIS — R110 Nausea: Secondary | ICD-10-CM

## 2023-08-17 DIAGNOSIS — R002 Palpitations: Secondary | ICD-10-CM | POA: Diagnosis not present

## 2023-08-17 DIAGNOSIS — R1013 Epigastric pain: Secondary | ICD-10-CM | POA: Diagnosis not present

## 2023-08-17 DIAGNOSIS — Z860101 Personal history of adenomatous and serrated colon polyps: Secondary | ICD-10-CM | POA: Diagnosis not present

## 2023-08-17 DIAGNOSIS — Z952 Presence of prosthetic heart valve: Secondary | ICD-10-CM | POA: Diagnosis not present

## 2023-08-17 DIAGNOSIS — I350 Nonrheumatic aortic (valve) stenosis: Secondary | ICD-10-CM | POA: Diagnosis not present

## 2023-08-17 LAB — COMPREHENSIVE METABOLIC PANEL WITH GFR
ALT: 25 U/L (ref 0–35)
AST: 20 U/L (ref 0–37)
Albumin: 4.4 g/dL (ref 3.5–5.2)
Alkaline Phosphatase: 74 U/L (ref 39–117)
BUN: 29 mg/dL — ABNORMAL HIGH (ref 6–23)
CO2: 30 meq/L (ref 19–32)
Calcium: 10 mg/dL (ref 8.4–10.5)
Chloride: 98 meq/L (ref 96–112)
Creatinine, Ser: 0.71 mg/dL (ref 0.40–1.20)
GFR: 77.31 mL/min (ref >60.00)
Glucose, Bld: 99 mg/dL (ref 70–99)
Potassium: 4.5 meq/L (ref 3.5–5.1)
Sodium: 134 meq/L — ABNORMAL LOW (ref 135–145)
Total Bilirubin: 0.4 mg/dL (ref 0.2–1.2)
Total Protein: 7.3 g/dL (ref 6.0–8.3)

## 2023-08-17 LAB — CBC WITH DIFFERENTIAL/PLATELET
Basophils Absolute: 0 10*3/uL (ref 0.0–0.1)
Basophils Relative: 0.1 % (ref 0.0–3.0)
Eosinophils Absolute: 0 10*3/uL (ref 0.0–0.7)
Eosinophils Relative: 0.1 % (ref 0.0–5.0)
HCT: 41 % (ref 36.0–46.0)
Hemoglobin: 13.6 g/dL (ref 12.0–15.0)
Lymphocytes Relative: 10 % — ABNORMAL LOW (ref 12.0–46.0)
Lymphs Abs: 0.9 10*3/uL (ref 0.7–4.0)
MCHC: 33.1 g/dL (ref 30.0–36.0)
MCV: 102.8 fl — ABNORMAL HIGH (ref 78.0–100.0)
Monocytes Absolute: 0.9 10*3/uL (ref 0.1–1.0)
Monocytes Relative: 9.1 % (ref 3.0–12.0)
Neutro Abs: 7.6 10*3/uL (ref 1.4–7.7)
Neutrophils Relative %: 80.7 % — ABNORMAL HIGH (ref 43.0–77.0)
Platelets: 268 10*3/uL (ref 150.0–400.0)
RBC: 3.99 Mil/uL (ref 3.87–5.11)
RDW: 14.1 % (ref 11.5–15.5)
WBC: 9.5 10*3/uL (ref 4.0–10.5)

## 2023-08-17 MED ORDER — FAMOTIDINE 20 MG PO TABS: 20.0000 mg | ORAL_TABLET | Freq: Two times a day (BID) | ORAL | 2 refills | Status: DC

## 2023-08-17 NOTE — Patient Instructions (Addendum)
 Your provider has requested that you go to the basement level for lab work before leaving today. Press "B" on the elevator. The lab is located at the first door on the left as you exit the elevator.  Due to recent changes in healthcare laws, you may see the results of your imaging and laboratory studies on MyChart before your provider has had a chance to review them.  We understand that in some cases there may be results that are confusing or concerning to you. Not all laboratory results come back in the same time frame and the provider may be waiting for multiple results in order to interpret others.  Please give us  48 hours in order for your provider to thoroughly review all the results before contacting the office for clarification of your results.   You have been scheduled for an abdominal ultrasound at [facility] (1st floor of hospital) on Wednesday, 08/23/23 at 8:30am. Please arrive 15 minutes prior to your appointment for registration. Make certain not to have anything to eat or drink after midnight prior to your appointment. Should you need to reschedule your appointment, please contact radiology at [phone]. This test typically takes about 30 minutes to perform.  Please take your proton pump inhibitor medication, pepcid   Please take this medication 30 minutes to 1 hour before meals- this makes it more effective.  Avoid spicy and acidic foods Avoid fatty foods Limit your intake of coffee, tea, alcohol , and carbonated drinks Work to maintain a healthy weight Keep the head of the bed elevated at least 3 inches with blocks or a wedge pillow if you are having any nighttime symptoms Stay upright for 2 hours after eating Avoid meals and snacks three to four hours before bedtime.  Follow up in 2 months.  Thank you for trusting me with your gastrointestinal care!   Santina Cull, PA-C  _______________________________________________________  If your blood pressure at your visit  was 140/90 or greater, please contact your primary care physician to follow up on this.  _______________________________________________________  If you are age 40 or older, your body mass index should be between 23-30. Your Body mass index is 18.78 kg/m. If this is out of the aforementioned range listed, please consider follow up with your Primary Care Provider.  If you are age 29 or younger, your body mass index should be between 19-25. Your Body mass index is 18.78 kg/m. If this is out of the aformentioned range listed, please consider follow up with your Primary Care Provider.   ________________________________________________________  The [facility] providers would like to encourage you to use MYCHART to communicate with providers for non-urgent requests or questions.  Due to long hold times on the telephone, sending your provider a message by [facility] may be a faster and more efficient way to get a response.  Please allow 48 business hours for a response.  Please remember that this is for non-urgent requests.  _______________________________________________________       FODMAP stands for fermentable oligo-, di-, mono-saccharides and polyols (1). These are the scientific terms used to classify groups of carbs that are difficult for our body to digest and that are notorious for triggering digestive symptoms like bloating, gas, loose stools and stomach pain.   You can try low FODMAP diet  - start with eliminating just one column at a time that you feel may be a trigger for you. - the table at the very bottom contains foods that are low in FODMAPs   Sometimes  trying to eliminate the FODMAP's from your diet is difficult or tricky, if you are stuggling with trying to do the elimination diet you can try an enzyme.  There is a food enzymes that you sprinkle in or on your food that helps break down the FODMAP. You can read more about the enzyme by going to this  site: https://fodzyme.com/

## 2023-08-17 NOTE — Progress Notes (Signed)
 08/17/2023 Linda Parker [id number] June 05, 1935  Referring provider: Suzzanne Estrin, MD Primary GI doctor: Dr. Karene Oto  ASSESSMENT AND PLAN:   Lymphocytic colitis/IBS with diarrhea  Diagnosed 12/30/2021 with colonoscopy 2023 negative ESR, CRP, C. diff, pancreatic elastase, fecal calprotectin Previously responded to budesonide  taper Was given dicyclomine  in dec but had nausea with this No issues with Bm's at this time  Post prandial nausea, GERD with epigastric burning, no fever, chills EGD 12/2021 for diarrhea Antral gastritis with shallow ulcers. Biopsied. Neg celiac, negative h pylori gastritis Has been on prednisone  for possible pleurisy x 3 days, no NSAIDS No melena Not on medication for GERD, at risk for worsening diarrhea with PPI -Will add on pepcid  20 mg BID, consider PPI if not helping but can increase risk of colitis -Check CBC, CMET -Will get RAB US  to rule out GB -- can consider repeat EGD if cardiology is unremarkable and symptoms are not better next ov  Personal history of adenomatous colon polyps 12/30/2021 4 mm TA polyp ascending colon diverticulosis otherwise normal positive microscopic colitis  Aortic stenosis status AVR 2015 with recent palpitations 02/08/2023 echo normal ejection fraction with well-functioning aortic valve prosthesis -Follow up with cardiology tomorrow, uncertain if Afib versus regular irregular/PVCs -ER precautions described.   Anxiety  Patient Care Team: Tisovec, Kristina Pfeiffer, MD as PCP - General Audery Blazing, Deannie Fabian, MD as PCP - Cardiology (Cardiology)  HISTORY OF PRESENT ILLNESS: 88 y.o. female with a past medical history listed below presents for evaluation of IBS.  Patient was last seen 04/12/2023 by Reginal Capra, PA.   Discussed the use of AI scribe software for clinical note transcription with the patient, who gave verbal consent to proceed.  History of Present Illness   Linda Parker "[name]" is an 88 year old female who  presents with nausea and gastrointestinal symptoms.  She experiences nausea after meals, described as a queasy sensation occurring after breakfast, lunch, and her main meal, typically consumed between 12 PM and 1 PM. Resting in her lift chair for 45 minutes alleviates the nausea. Despite modifying her diet to avoid cabbage, cruciferous vegetables, and dairy, opting for plant-based alternatives, the nausea persists.  She experiences acid reflux, particularly after consuming foods like chili with jalapeno peppers, described as a burning sensation and pain under her bra line, associated with stomach acid. She has not been taking any medication specifically for heartburn but uses Tums for relief.  She has been on prednisone  for three days, which she believes is causing soft stools. She has a history of lymphocytic colitis, which previously caused explosive diarrhea. Currently, her bowel movements are formed, though softer since starting prednisone . She practices portion control with her meals and engages in chair yoga for physical activity.  She has a history of pleurisy, initially diagnosed six years ago, with recurrent episodes. Various diagnostic tests, including X-rays and an MRI, ruled out lung cancer and broken ribs. Prednisone  was prescribed for chronic inflammation around her lungs.  She has a history of heart valve replacement in 2015 due to aortic stenosis and mild regurgitation. She experiences episodes of heart racing, particularly at night, which she attributes to her metoprolol  dosage. No dizziness, lightheadedness, sweating, or chest pain during these episodes.  She experiences intermittent vertigo, which has lasted up to eleven weeks in the past. She manages it by being cautious and wearing black glasses to prevent dizziness.  [place], which she finds beneficial for social interaction  after experiencing loneliness following her husband's death.      She   reports that she has never smoked. She has never used smokeless tobacco. She reports current alcohol  use of about 1.0 standard drink of alcohol  per week. She reports that she does not use drugs.  RELEVANT GI HISTORY, IMAGING AND LABS: Results   RADIOLOGY Chest X-ray: No malignancy, no rib fractures, pleural inflammation MRI: No malignancy      CBC    Component Value Date/Time   WBC 8.5 12/18/2021 1206   RBC 3.75 (L) 12/18/2021 1206   HGB 12.7 12/18/2021 1206   HCT 36.8 12/18/2021 1206   PLT 287 12/18/2021 1206   MCV 98.1 12/18/2021 1206   MCH 33.9 12/18/2021 1206   MCHC 34.5 12/18/2021 1206   RDW 12.8 12/18/2021 1206   LYMPHSABS 1.2 12/18/2021 1206   MONOABS 1.2 (H) 12/18/2021 1206   EOSABS 0.0 12/18/2021 1206   BASOSABS 0.0 12/18/2021 1206   No results for input(s): "HGB" in the last 8760 hours.  CMP     Component Value Date/Time   NA 135 12/18/2021 1206   K 3.9 12/18/2021 1206   CL 100 12/18/2021 1206   CO2 24 12/18/2021 1206   GLUCOSE 98 12/18/2021 1206   BUN 18 12/18/2021 1206   CREATININE 0.61 12/18/2021 1206   CALCIUM  9.0 12/18/2021 1206   PROT 6.8 12/18/2021 1206   PROT 6.8 07/08/2019 1033   ALBUMIN  4.1 12/18/2021 1206   ALBUMIN  4.5 07/08/2019 1033   AST 15 12/18/2021 1206   ALT 14 12/18/2021 1206   ALKPHOS 69 12/18/2021 1206   BILITOT 0.4 12/18/2021 1206   BILITOT 0.4 07/08/2019 1033   GFRNONAA >60 12/18/2021 1206   GFRAA >90 09/27/2013 0413      Latest Ref Rng & Units 12/18/2021   12:06 PM 07/08/2019   10:33 AM 01/23/2014    8:22 AM  Hepatic Function  Total Protein 6.5 - 8.1 g/dL 6.8  6.8  6.7   Albumin  3.5 - 5.0 g/dL 4.1  4.5  4.2   AST 15 - 41 U/L 15  19  19    ALT 0 - 44 U/L 14  11  15    Alk Phosphatase 38 - 126 U/L 69  79  74   Total Bilirubin 0.3 - 1.2 mg/dL 0.4  0.4  0.4   Bilirubin, Direct 0.00 - 0.40 mg/dL  1.30  0.1       Current Medications:   Current Outpatient Medications (Endocrine & Metabolic):    estradiol  (ESTRACE ) 0.5 MG  tablet, Take 0.5 mg by mouth daily before breakfast.    levothyroxine  (SYNTHROID ) 25 MCG tablet, Take by mouth.   predniSONE  5 MG/ML concentrated solution, Take by mouth daily with breakfast.   Current Outpatient Medications (Cardiovascular):    lovastatin  (MEVACOR ) 20 MG tablet, Take 1 tablet (20 mg total) by mouth at bedtime.   metoprolol  succinate (TOPROL -XL) 25 MG 24 hr tablet, Take 1 tablet (25 mg total) by mouth daily.   metoprolol  tartrate (LOPRESSOR ) 25 MG tablet, TAKE 1/2 TABLET TWICE DAILY   omega-3 acid ethyl esters (LOVAZA) 1 G capsule, Take 1 g by mouth 2 (two) times daily.   Current Outpatient Medications (Respiratory):    fluticasone (FLONASE) 50 MCG/ACT nasal spray, Place 1 spray into both nostrils as needed.   Current Outpatient Medications (Analgesics):    acetaminophen  (TYLENOL ) 325 MG tablet, Take 650 mg by mouth every 6 (six) hours as needed.   aspirin  81 MG tablet,  Take 81 mg by mouth daily.   traMADol  (ULTRAM ) 50 MG tablet, Take 1 tablet (50 mg total) by mouth every 6 (six) hours as needed.   Current Outpatient Medications (Hematological):    Cyanocobalamin  (VITAMIN B-12 PO), Take by mouth as directed.   folic acid  (FOLVITE ) 800 MCG tablet, Take 400 mcg by mouth daily.   Current Outpatient Medications (Other):    acyclovir (ZOVIRAX) 400 MG tablet, Take 400 mg by mouth 2 (two) times daily as needed (for cold sore). As needed   ALPRAZolam  (XANAX ) 0.25 MG tablet, Take 0.5 tablets by mouth daily.   Ascorbic Acid (VITAMIN C) POWD, Take by mouth. Taking 1/4 teaspoon daily   cholecalciferol (VITAMIN D) 1000 UNITS tablet, Take 2,000 Units by mouth daily.    CINNAMON PO, Take by mouth. Takes 1/4 teaspoon daily   dicyclomine  (BENTYL ) 10 MG capsule, Take 1 capsule (10 mg total) by mouth 2 (two) times daily. Take 1 capsule before breakfast and dinner,   famotidine  (PEPCID ) 20 MG tablet, Take 1 tablet (20 mg total) by mouth 2 (two) times daily.   Glucosamine-Chondroitin  (GLUCOSAMINE CHONDR COMPLEX PO), Take 1 tablet by mouth 2 (two) times daily.   hydrocortisone 2.5 % ointment, Apply 1 Application topically as needed.   influenza vac split trivalent (FLUZONE) injection, Inject into the muscle.   vitamin E 400 UNIT capsule, Take 400 Units by mouth daily.   VITAMIN K PO, Take by mouth.  Current Facility-Administered Medications (Other):    0.9 %  sodium chloride  infusion  Medical History:  Past Medical History:  Diagnosis Date   Allergy    Anxiety    Aortic stenosis    s/p AVR w/ 23 mm Edward Magna-ease Pericardial Valve   Arthritis    hands & knee- L    Coronary artery disease    Depression    Dysrhythmia    when taking antihistamines   Heart murmur    History of echocardiogram    Echo (8/15): EF 55-60%, normal wall motion, grade 1 diastolic dysfunction, AVR okay (mean 10 mm Hg), normal RV function, moderate TR   Osteoporosis    Palpitations    Unspecified hypothyroidism    Vertigo    Allergies:  Allergies  Allergen Reactions   Dicyclomine  Hcl Nausea Only and Other (See Comments)    Heart burn and gripping pain   Erythromycin Nausea Only    Feels like she is dying/out of her body   Erythromycin Base Other (See Comments)    Has out of body feeling   Monosodium Glutamate Other (See Comments)    Migraine headache    Nitrates, Organic     headache   Sulfamethoxazole Other (See Comments)    Makes her feel strange   Sulfonamide Derivatives Nausea Only    Feels like she is dying/out of her body   Ciprofloxacin Palpitations    Feels out of her body experience   Versed  [Midazolam ] Other (See Comments)    Pt experienced forgetfulness for days after and requests we do not use.     Surgical History:  She  has a past surgical history that includes Finger surgery; bilateral knee surgery (35 yrs. ago); Abdominal hysterectomy; Tonsillectomy; Appendectomy; Total knee arthroplasty (Right, 03/04/2013); Injection knee (Left, 03/04/2013); Cardiac  catheterization; Aortic valve replacement (N/A, 09/24/2013); Coronary artery bypass graft (N/A, 09/24/2013); Intraoprative transesophageal echocardiogram (N/A, 09/24/2013); left and right heart catheterization with coronary angiogram (N/A, 09/17/2013); Cardiac valve replacement; Mass excision (Right, 05/17/2018); and Mass excision (Right,  07/14/2020). Family History:  Her family history includes ALS in her brother; Esophageal cancer in her father; Heart disease in her brother, father, mother, sister, and sister; Heart failure in her mother; Hyperlipidemia in her sister.  REVIEW OF SYSTEMS  : All other systems reviewed and negative except where noted in the History of Present Illness.  PHYSICAL EXAM: BP 122/64   Pulse 81   Ht 5\' 3"  (1.6 m)   Wt 106 lb (48.1 kg)   BMI 18.78 kg/m  Physical Exam   GENERAL APPEARANCE: Well nourished, in no apparent distress. HEENT: No cervical lymphadenopathy, unremarkable thyroid , sclerae anicteric, conjunctiva pink. RESPIRATORY: Respiratory effort normal, BS equal bilateral without rales, rhonchi, wheezing. CARDIO: normal rate, possible PVCs versus irregular with no, Aortic valve with very slight murmur. ABDOMEN: Soft, non distended, active bowel sounds in all 4 quadrants, non-tender to palpation, normal to percussion, no rebound, no mass appreciated. RECTAL: Declines. MUSCULOSKELETAL: Full ROM, normal gait, without edema. SKIN: Dry, intact without rashes or lesions. No jaundice. NEURO: Alert, oriented, no focal deficits. PSYCH: Cooperative, normal mood and affect.      Edmonia Gottron, PA-C 2:08 PM

## 2023-08-18 ENCOUNTER — Ambulatory Visit

## 2023-08-18 ENCOUNTER — Other Ambulatory Visit: Payer: Self-pay

## 2023-08-18 DIAGNOSIS — E538 Deficiency of other specified B group vitamins: Secondary | ICD-10-CM | POA: Diagnosis not present

## 2023-08-18 DIAGNOSIS — D649 Anemia, unspecified: Secondary | ICD-10-CM

## 2023-08-18 LAB — IBC + FERRITIN
Ferritin: 75 ng/mL (ref 10.0–291.0)
Iron: 61 ug/dL (ref 42–145)
Saturation Ratios: 16 % — ABNORMAL LOW (ref 20.0–50.0)
TIBC: 382.2 ug/dL (ref 250.0–450.0)
Transferrin: 273 mg/dL (ref 212.0–360.0)

## 2023-08-18 LAB — B12 AND FOLATE PANEL
Folate: 16.2 ng/mL (ref >5.9)
Vitamin B-12: 394 pg/mL (ref 211–911)

## 2023-08-21 ENCOUNTER — Telehealth: Payer: Self-pay | Admitting: Cardiology

## 2023-08-21 DIAGNOSIS — R002 Palpitations: Secondary | ICD-10-CM

## 2023-08-21 NOTE — Telephone Encounter (Signed)
 Left message for pt to call.

## 2023-08-21 NOTE — Telephone Encounter (Signed)
 Pt c/o swelling/edema: STAT if pt has developed SOB within 24 hours  If swelling, where is the swelling located?  Right ankle only  How much weight have you gained and in what time span?  No  Have you gained 2 pounds in a day or 5 pounds in a week?   No  Do you have a log of your daily weights (if so, list)?   No  Are you currently taking a fluid pill? No  Are you currently SOB? No  Have you traveled recently in a car or plane for an extended period of time?  Not given  Patient c/o Palpitations:  STAT if patient reporting lightheadedness, shortness of breath, or chest pain  How long have you had palpitations/irregular HR/ Afib? Are you having the symptoms now?   No - Intermittent  Are you currently experiencing lightheadedness, SOB or CP?   No  Do you have a history of afib (atrial fibrillation) or irregular heart rhythm?   No  Have you checked your BP or HR? (document readings if available):   Not given  Are you experiencing any other symptoms?   Not given  Patient is concerned she has been having swelling in her right ankle and she has been having racing heart beats.  Patient requested a call back directly from [employer].  Patient ended call before all questions were answered.

## 2023-08-21 NOTE — Telephone Encounter (Signed)
 Spoke with pt, aware monitor will be mailed to her. Her palpitations are usually at night so not interested in ECG right now.

## 2023-08-21 NOTE — Telephone Encounter (Signed)
 Patient was returning call. Please advise ?

## 2023-08-21 NOTE — Telephone Encounter (Signed)
 Spoke with pt, she has started having swelling in her right ankle only. The right is the harvest legs from her surgery. She does not think there are any diet changes and it is normal in the morning and swelling by the end of the day. She will try compression hose top see if that helps. She is also having racing of the heart, usually occurs at night after she is flipping and flopping to get the covers right. Usually she can slow breathe and it will calm down. She also reports an increase in skipping of the heart beat. She is concerned about atrial fib. She walks daily and does not have any problems with walking. Aware dr Audery Blazing is not in the office but will forward for his review.

## 2023-08-23 ENCOUNTER — Ambulatory Visit (HOSPITAL_COMMUNITY)

## 2023-08-24 ENCOUNTER — Ambulatory Visit (HOSPITAL_COMMUNITY)
Admission: RE | Admit: 2023-08-24 | Discharge: 2023-08-24 | Disposition: A | Discharge status: Home / Self Care | Source: Ambulatory Visit | Attending: Physician Assistant | Admitting: Physician Assistant

## 2023-08-24 DIAGNOSIS — R110 Nausea: Secondary | ICD-10-CM | POA: Diagnosis not present

## 2023-08-24 DIAGNOSIS — K219 Gastro-esophageal reflux disease without esophagitis: Secondary | ICD-10-CM | POA: Diagnosis not present

## 2023-08-24 DIAGNOSIS — R109 Unspecified abdominal pain: Secondary | ICD-10-CM | POA: Diagnosis not present

## 2023-08-25 ENCOUNTER — Telehealth: Payer: Self-pay | Admitting: Physician Assistant

## 2023-08-25 NOTE — Telephone Encounter (Signed)
 Patient called and stated that she was wanting to know is she is needing a follow up since she already had her ultrasound and lab work done. Patient also wanting to inform Santina Cull that was indeed taking the Pepcid  that was prescribed to her. Patient is requesting a call back. Please advise.

## 2023-08-25 NOTE — Telephone Encounter (Signed)
 Pt questioned if she had a follow up appointment. Pt was notified of the appointment that was scheduled for her in June. Pt stated that she needed to reschedule that appointment due to her being on vacation. Pt was rescheduled for July 3rd at 10:20 with Santina Cull. PA. Pt made aware.  Pt verbalized understanding with all questions answered.

## 2023-08-30 ENCOUNTER — Ambulatory Visit: Attending: Cardiology

## 2023-08-30 DIAGNOSIS — R002 Palpitations: Secondary | ICD-10-CM

## 2023-08-30 NOTE — Progress Notes (Unsigned)
 Enrolled patient for a 3 day Zio XT monitor to be mailed to patients home

## 2023-09-03 NOTE — Progress Notes (Signed)
 Agree with the assessment and plan as outlined by Quentin Mulling, PA-C. ? ?Keron Neenan, DO, FACG ? ?

## 2023-09-06 DIAGNOSIS — S0081XA Abrasion of other part of head, initial encounter: Secondary | ICD-10-CM | POA: Diagnosis not present

## 2023-09-06 DIAGNOSIS — Z85828 Personal history of other malignant neoplasm of skin: Secondary | ICD-10-CM | POA: Diagnosis not present

## 2023-09-08 NOTE — Telephone Encounter (Signed)
 Pt called to inform nurse that heart monitor had a malfunction and they are sending her a new one

## 2023-09-19 ENCOUNTER — Telehealth: Payer: Self-pay | Admitting: Physician Assistant

## 2023-09-19 MED ORDER — FAMOTIDINE 20 MG PO TABS: 20.0000 mg | ORAL_TABLET | Freq: Two times a day (BID) | ORAL | 2 refills | Status: DC

## 2023-09-19 NOTE — Telephone Encounter (Signed)
 Inbound call from patient, would like famotidine  prescription sent to [facility] due to her now living at a retirement home and unable to drive to [facility].

## 2023-09-19 NOTE — Telephone Encounter (Signed)
 I called Linda Parker and advised her that her Famotidine  refill has been resent to [facility].

## 2023-10-17 ENCOUNTER — Ambulatory Visit: Admitting: Physician Assistant

## 2023-10-25 NOTE — Progress Notes (Unsigned)
 10/26/2023 Linda Parker [id number] [birth year]  Referring provider: Vernadine Charlie ORN, MD Primary GI doctor: Dr. San  ASSESSMENT AND PLAN:   Post prandial nausea, better after resting assumed it was prednisone  induced, started on pepcid  20 mg BID EGD 12/2021 for diarrhea Antral gastritis with shallow ulcers. Biopsied. Neg celiac, negative h pylori gastritis 08/24/2023 ABUS unremarkable CBC without anemia normal kidney liver patient did have macrocytosis started on B12, normal iron ferritin - get CT AB and pelvis with contrast rule out SMA/epigastric issues - continue Pepcid  20 mg twice a day - continue probiotic -- can consider repeat EGD if cardiology is unremarkable and symptoms are not better next ov, has OV in July but patient prefers to avoid  Lymphocytic colitis/IBS with diarrhea  Diagnosed 12/30/2021 with colonoscopy 2023 negative ESR, CRP, C. diff, pancreatic elastase, fecal calprotectin Previously responded to budesonide  taper unable to tolerate dicyclomine  Has more constipation than anything Add on miralax /fiber continue FODMAP diet, on IBGard  Personal history of adenomatous colon polyps 12/30/2021 4 mm TA polyp ascending colon diverticulosis otherwise normal positive microscopic colitis No recall secondary to age  Aortic stenosis status AVR 2015  Has had some right lower leg swelling, improves overnight 02/08/2023 echo normal ejection fraction with well-functioning aortic valve prosthesis - Wearing Zio patch follows with Dr. Pietro has appointment 7/21 -ER precautions described.   Anxiety Likely contributing to the IBS/palpitations  Patient Care Team: Tisovec, Charlie ORN, MD as PCP - General Pietro, Redell RAMAN, MD as PCP - Cardiology (Cardiology)  HISTORY OF PRESENT ILLNESS: 88 y.o. female with a past medical history listed below presents for evaluation of IBS.   Patient was last seen 08/17/2023 by myself for nausea without vomiting, GERD lymphocytic  colitis  Discussed the use of AI scribe software for clinical note transcription with the patient, who gave verbal consent to proceed.  History of Present Illness   Linda Parker is an 88 year old female with lymphocytic colitis who presents with gastrointestinal symptoms and right ankle swelling.  She experiences gastrointestinal symptoms triggered by dietary factors, such as cherry tomatoes and spicy foods, leading to severe griping pains. She follows a FODMAP diet and avoids foods that exacerbate her symptoms. Her current medications include IBgard before meals, prescription Pepcid  (famotidine ) twice daily before breakfast and supper, and a daily probiotic. She experiences nausea and discomfort after meals, which improves with rest in a lift chair. Physical activity, such as walking at [employer], helps alleviate symptoms. She walks three-fourths of a mile twice a week and previously used a pedometer to track her steps. She reports a significant weight loss of 26 pounds following her husband's death and currently weighs 103-104 pounds. She attributes her stable weight to a low-carb diet, eating half portions of meals, and avoiding frequent wardrobe changes due to weight fluctuations. She is more social and happy in her current living situation.   She has a history of lymphocytic colitis and is currently experiencing constipation despite taking fiber supplements twice daily. Previously, she experienced diarrhea, which has been treated. No current palpitations, fast heartbeats, shortness of breath, chest pain, dizziness, food or pills getting caught, and significant discomfort except when pressure is applied.  She reports right ankle swelling, particularly in the evening, which she associates with prolonged standing. The swelling is puffy and sometimes feels warm. This is the leg where a vein was harvested for a previous procedure. There is no pain in the right leg, and the left leg  is  unaffected.  She has a history of heart rhythm issues, including palpitations and fluttering, which resolved without intervention. She takes metoprolol  at night to manage her heart rate.     She  reports that she has never smoked. She has never used smokeless tobacco. She reports current alcohol  use of about 1.0 standard drink of alcohol  per week. She reports that she does not use drugs.  RELEVANT GI HISTORY, IMAGING AND LABS: Results   RADIOLOGY Abdominal ultrasound: Liver, gallbladder, spleen, pancreas normal CT angiography (2022): No pulmonary embolism      CBC    Component Value Date/Time   WBC 9.5 08/17/2023 1503   RBC 3.99 08/17/2023 1503   HGB 13.6 08/17/2023 1503   HCT 41.0 08/17/2023 1503   PLT 268.0 08/17/2023 1503   MCV 102.8 (H) 08/17/2023 1503   MCH 33.9 12/18/2021 1206   MCHC 33.1 08/17/2023 1503   RDW 14.1 08/17/2023 1503   LYMPHSABS 0.9 08/17/2023 1503   MONOABS 0.9 08/17/2023 1503   EOSABS 0.0 08/17/2023 1503   BASOSABS 0.0 08/17/2023 1503   Recent Labs    08/17/23 1503  HGB 13.6    CMP     Component Value Date/Time   NA 134 (L) 08/17/2023 1503   K 4.5 08/17/2023 1503   CL 98 08/17/2023 1503   CO2 30 08/17/2023 1503   GLUCOSE 99 08/17/2023 1503   BUN 29 (H) 08/17/2023 1503   CREATININE 0.71 08/17/2023 1503   CALCIUM  10.0 08/17/2023 1503   PROT 7.3 08/17/2023 1503   PROT 6.8 07/08/2019 1033   ALBUMIN  4.4 08/17/2023 1503   ALBUMIN  4.5 07/08/2019 1033   AST 20 08/17/2023 1503   ALT 25 08/17/2023 1503   ALKPHOS 74 08/17/2023 1503   BILITOT 0.4 08/17/2023 1503   BILITOT 0.4 07/08/2019 1033   GFRNONAA >60 12/18/2021 1206   GFRAA >90 09/27/2013 0413      Latest Ref Rng & Units 08/17/2023    3:03 PM 12/18/2021   12:06 PM 07/08/2019   10:33 AM  Hepatic Function  Total Protein 6.0 - 8.3 g/dL 7.3  6.8  6.8   Albumin  3.5 - 5.2 g/dL 4.4  4.1  4.5   AST 0 - 37 U/L 20  15  19    ALT 0 - 35 U/L 25  14  11    Alk Phosphatase 39 - 117 U/L 74  69  79    Total Bilirubin 0.2 - 1.2 mg/dL 0.4  0.4  0.4   Bilirubin, Direct 0.00 - 0.40 mg/dL   9.85       Current Medications:   Current Outpatient Medications (Endocrine & Metabolic):    estradiol  (ESTRACE ) 0.5 MG tablet, Take 0.5 mg by mouth daily before breakfast.    levothyroxine  (SYNTHROID ) 25 MCG tablet, Take by mouth.   Current Outpatient Medications (Cardiovascular):    lovastatin  (MEVACOR ) 20 MG tablet, Take 1 tablet (20 mg total) by mouth at bedtime.   metoprolol  succinate (TOPROL -XL) 25 MG 24 hr tablet, Take 1 tablet (25 mg total) by mouth daily.   omega-3 acid ethyl esters (LOVAZA) 1 G capsule, Take 1 g by mouth 2 (two) times daily.   Current Outpatient Medications (Respiratory):    fluticasone (FLONASE) 50 MCG/ACT nasal spray, Place 1 spray into both nostrils as needed.   Current Outpatient Medications (Analgesics):    acetaminophen  (TYLENOL ) 325 MG tablet, Take 650 mg by mouth every 6 (six) hours as needed.   aspirin  81 MG tablet, Take 81  mg by mouth daily.   traMADol  (ULTRAM ) 50 MG tablet, Take 1 tablet (50 mg total) by mouth every 6 (six) hours as needed.   Current Outpatient Medications (Hematological):    Cyanocobalamin  (VITAMIN B-12 PO), Take by mouth as directed.   Current Outpatient Medications (Other):    acyclovir (ZOVIRAX) 400 MG tablet, Take 400 mg by mouth 2 (two) times daily as needed (for cold sore). As needed   ALPRAZolam  (XANAX ) 0.25 MG tablet, Take 0.5 tablets by mouth daily. (Patient taking differently: Take 0.5 tablets by mouth as needed.)   cholecalciferol (VITAMIN D) 1000 UNITS tablet, Take 2,000 Units by mouth daily.    CINNAMON PO, Take by mouth. Takes 1/4 teaspoon daily   famotidine  (PEPCID ) 20 MG tablet, Take 1 tablet (20 mg total) by mouth 2 (two) times daily.   Glucosamine-Chondroitin (GLUCOSAMINE CHONDR COMPLEX PO), Take 1 tablet by mouth 2 (two) times daily.   hydrocortisone 2.5 % ointment, Apply 1 Application topically as needed.    influenza vac split trivalent (FLUZONE) injection, Inject into the muscle.   vitamin E 400 UNIT capsule, Take 400 Units by mouth daily.   VITAMIN K PO, Take by mouth.  Current Facility-Administered Medications (Other):    0.9 %  sodium chloride  infusion  Medical History:  Past Medical History:  Diagnosis Date   Allergy    Anxiety    Aortic stenosis    s/p AVR w/ 23 mm Edward Magna-ease Pericardial Valve   Arthritis    hands & knee- L    Coronary artery disease    Depression    Dysrhythmia    when taking antihistamines   Heart murmur    History of echocardiogram    Echo (8/15): EF 55-60%, normal wall motion, grade 1 diastolic dysfunction, AVR okay (mean 10 mm Hg), normal RV function, moderate TR   Osteoporosis    Palpitations    Unspecified hypothyroidism    Vertigo    Allergies:  Allergies  Allergen Reactions   Dicyclomine  Hcl Nausea Only and Other (See Comments)    Heart burn and gripping pain   Erythromycin Nausea Only    Feels like she is dying/out of her body   Erythromycin Base Other (See Comments)    Has out of body feeling   Monosodium Glutamate Other (See Comments)    Migraine headache    Nitrates, Organic     headache   Sulfamethoxazole Other (See Comments)    Makes her feel strange   Sulfonamide Derivatives Nausea Only    Feels like she is dying/out of her body   Ciprofloxacin Palpitations    Feels out of her body experience   Versed  [Midazolam ] Other (See Comments)    Pt experienced forgetfulness for days after and requests we do not use.     Surgical History:  She  has a past surgical history that includes Finger surgery; bilateral knee surgery (35 yrs. ago); Abdominal hysterectomy; Tonsillectomy; Appendectomy; Total knee arthroplasty (Right, 03/04/2013); Injection knee (Left, 03/04/2013); Cardiac catheterization; Aortic valve replacement (N/A, 09/24/2013); Coronary artery bypass graft (N/A, 09/24/2013); Intraoprative transesophageal echocardiogram (N/A,  09/24/2013); left and right heart catheterization with coronary angiogram (N/A, 09/17/2013); Cardiac valve replacement; Mass excision (Right, 05/17/2018); and Mass excision (Right, 07/14/2020). Family History:  Her family history includes ALS in her brother; Esophageal cancer in her father; Heart disease in her brother, father, mother, sister, and sister; Heart failure in her mother; Hyperlipidemia in her sister.  REVIEW OF SYSTEMS  : All other systems reviewed  and negative except where noted in the History of Present Illness.  PHYSICAL EXAM: BP 110/60   Pulse 72   Ht 5' 3.75 (1.619 m)   Wt 106 lb 6 oz (48.3 kg)   BMI 18.40 kg/m  Physical Exam   GENERAL APPEARANCE: Well nourished, in no apparent distress. HEENT: No cervical lymphadenopathy, unremarkable thyroid , sclerae anicteric, conjunctiva pink. RESPIRATORY: Respiratory effort normal, BS equal bilateral without rales, rhonchi, wheezing. CARDIO: normal rate, possible PVCs versus irregular with no, Aortic valve with very slight murmur. ABDOMEN: Soft, non distended, active bowel sounds in all 4 quadrants, non-tender to palpation, normal to percussion, no rebound, no mass appreciated. RECTAL: Declines. MUSCULOSKELETAL: Full ROM, normal gait, without edema. SKIN: Dry, intact without rashes or lesions. No jaundice. NEURO: Alert, oriented, no focal deficits. PSYCH: Cooperative, normal mood and affect.      Alan JONELLE Coombs, PA-C 11:00 AM

## 2023-10-26 ENCOUNTER — Other Ambulatory Visit

## 2023-10-26 ENCOUNTER — Encounter: Payer: Self-pay | Admitting: Physician Assistant

## 2023-10-26 ENCOUNTER — Ambulatory Visit: Admitting: Physician Assistant

## 2023-10-26 VITALS — BP 110/60 | HR 72 | Ht 63.75 in | Wt 106.4 lb

## 2023-10-26 DIAGNOSIS — K219 Gastro-esophageal reflux disease without esophagitis: Secondary | ICD-10-CM | POA: Diagnosis not present

## 2023-10-26 DIAGNOSIS — I359 Nonrheumatic aortic valve disorder, unspecified: Secondary | ICD-10-CM

## 2023-10-26 DIAGNOSIS — R109 Unspecified abdominal pain: Secondary | ICD-10-CM

## 2023-10-26 DIAGNOSIS — R110 Nausea: Secondary | ICD-10-CM

## 2023-10-26 DIAGNOSIS — Z952 Presence of prosthetic heart valve: Secondary | ICD-10-CM | POA: Diagnosis not present

## 2023-10-26 DIAGNOSIS — K582 Mixed irritable bowel syndrome: Secondary | ICD-10-CM

## 2023-10-26 DIAGNOSIS — K52832 Lymphocytic colitis: Secondary | ICD-10-CM | POA: Diagnosis not present

## 2023-10-26 DIAGNOSIS — Z860101 Personal history of adenomatous and serrated colon polyps: Secondary | ICD-10-CM | POA: Diagnosis not present

## 2023-10-26 DIAGNOSIS — R1031 Right lower quadrant pain: Secondary | ICD-10-CM

## 2023-10-26 LAB — COMPREHENSIVE METABOLIC PANEL WITH GFR
ALT: 17 U/L (ref 0–35)
AST: 15 U/L (ref 0–37)
Albumin: 4.3 g/dL (ref 3.5–5.2)
Alkaline Phosphatase: 69 U/L (ref 39–117)
BUN: 22 mg/dL (ref 6–23)
CO2: 28 meq/L (ref 19–32)
Calcium: 9.6 mg/dL (ref 8.4–10.5)
Chloride: 99 meq/L (ref 96–112)
Creatinine, Ser: 0.62 mg/dL (ref 0.40–1.20)
GFR: 80.86 mL/min (ref >60.00)
Glucose, Bld: 96 mg/dL (ref 70–99)
Potassium: 4.2 meq/L (ref 3.5–5.1)
Sodium: 133 meq/L — ABNORMAL LOW (ref 135–145)
Total Bilirubin: 0.4 mg/dL (ref 0.2–1.2)
Total Protein: 7 g/dL (ref 6.0–8.3)

## 2023-10-26 NOTE — Patient Instructions (Addendum)
 _______________________________________________________  If your blood pressure at your visit was 140/90 or greater, please contact your primary care physician to follow up on this.  _______________________________________________________  If you are age 86 or older, your body mass index should be between 23-30. Your Body mass index is 18.4 kg/m. If this is out of the aforementioned range listed, please consider follow up with your Primary Care Provider.  If you are age 47 or younger, your body mass index should be between 19-25. Your Body mass index is 18.4 kg/m. If this is out of the aformentioned range listed, please consider follow up with your Primary Care Provider.   ________________________________________________________  The [facility] providers would like to encourage you to use MYCHART to communicate with providers for non-urgent requests or questions.  Due to long hold times on the telephone, sending your provider a message by [facility] may be a faster and more efficient way to get a response.  Please allow 48 business hours for a response.  Please remember that this is for non-urgent requests.  _______________________________________________________  Your provider has requested that you go to the basement level for lab work before leaving today. Press B on the elevator. The lab is located at the first door on the left as you exit the elevator.   You have been scheduled for an appointment with Alan Coombs PA-C on 12-06-23 at 1040am . Please arrive 10 minutes early for your appointment.  You have been scheduled for a CT scan of the abdomen and pelvis at [facility] ([address] [place], [place], [place] [address]).   You are scheduled on 11-07-23 at 4pm. You should arrive by 3:45pm your appointment time for registration. Please follow the written instructions below on the day of your exam:  WARNING: IF YOU ARE ALLERGIC TO IODINE/X-RAY DYE, PLEASE NOTIFY RADIOLOGY IMMEDIATELY  AT [phone]! YOU WILL BE GIVEN A 13 HOUR PREMEDICATION PREP.  1) Do not eat or drink anything after 12pm (4 hours prior to your test)  You may take any medications as prescribed with a small amount of water, if necessary. If you take any of the following medications: METFORMIN, GLUCOPHAGE, GLUCOVANCE, AVANDAMET, RIOMET, FORTAMET, ACTOPLUS MET, JANUMET, GLUMETZA or METAGLIP, you MAY be asked to HOLD this medication 48 hours AFTER the exam.  The purpose of you drinking the oral contrast is to aid in the visualization of your intestinal tract. The contrast solution may cause some diarrhea. Depending on your individual set of symptoms, you may also receive an intravenous injection of x-ray contrast/dye. Plan on being at [facility] for 30 minutes or longer, depending on the type of exam you are having performed.  This test typically takes 30-45 minutes to complete.  If you have any questions regarding your exam or if you need to reschedule, you may call the CT department at [phone] between the hours of 8:00 am and 5:00 pm, Monday-Friday.  Miralax  is an osmotic laxative.  It only brings more water into the stool.  This is safe to take daily.  Can take up to 17 gram of miralax  twice a day.  Mix with juice or coffee.  Start 1 capful at night for 3-4 days and reassess your response in 3-4 days.  You can increase and decrease the dose based on your response.  Remember, it can take up to 3-4 days to take effect OR for the effects to wear off.   I often pair this with benefiber in the morning to help assure  the stool is not too loose.

## 2023-10-30 NOTE — Progress Notes (Addendum)
 HPI: FU AVR and CAD. Echocardiogram in May 2015 demonstrated severe aortic stenosis. She was symptomatic and referred for cardiac catheterization. This demonstrated 3 vessel CAD. She was referred for CABG and AVR. She was admitted 6/15 and underwent CABG (LIMA-LAD, SVG-D1, SVG-D2, SVG-OM1) and bioprosthetic AVR (23 mm Edwards magna-ease pericardial valve). Note preoperative carotid Dopplers May 2015 showed bilateral 1-39% stenosis.  CTA October 2022 showed coronary and aortic atherosclerosis and borderline dilatation of the aortic arch at 3.1 cm.  Follow-up echocardiogram October 2024 showed normal LV function, mild left ventricular hypertrophy, mild RV dysfunction, mild left atrial enlargement, mild mitral regurgitation, mild to moderate tricuspid regurgitation, status post aortic valve replacement with no aortic insufficiency, mean gradient 11.4 mmHg. Since last seen, there is no dyspnea, chest pain, palpitations or syncope.  Current Outpatient Medications  Medication Sig Dispense Refill   acetaminophen  (TYLENOL ) 325 MG tablet Take 650 mg by mouth every 6 (six) hours as needed.     acyclovir (ZOVIRAX) 400 MG tablet Take 400 mg by mouth 2 (two) times daily as needed (for cold sore). As needed     aspirin  81 MG tablet Take 81 mg by mouth daily.     cholecalciferol (VITAMIN D) 1000 UNITS tablet Take 2,000 Units by mouth daily.      CINNAMON PO Take by mouth. Takes 1/4 teaspoon daily     Cyanocobalamin  (VITAMIN B-12 PO) Take by mouth as directed.     estradiol  (ESTRACE ) 0.5 MG tablet Take 0.5 mg by mouth daily before breakfast.      famotidine  (PEPCID ) 20 MG tablet Take 1 tablet (20 mg total) by mouth 2 (two) times daily. 60 tablet 2   fluticasone (FLONASE) 50 MCG/ACT nasal spray Place 1 spray into both nostrils as needed.     Glucosamine-Chondroitin (GLUCOSAMINE CHONDR COMPLEX PO) Take 1 tablet by mouth 2 (two) times daily.     hydrocortisone 2.5 % ointment Apply 1 Application topically as  needed.     levothyroxine  (SYNTHROID ) 25 MCG tablet Take by mouth.     lovastatin  (MEVACOR ) 20 MG tablet Take 1 tablet (20 mg total) by mouth at bedtime. 90 tablet 3   metoprolol  succinate (TOPROL -XL) 25 MG 24 hr tablet Take 1 tablet (25 mg total) by mouth daily. 90 tablet 3   omega-3 acid ethyl esters (LOVAZA) 1 G capsule Take 1 g by mouth 2 (two) times daily.     vitamin E 400 UNIT capsule Take 400 Units by mouth daily.     Current Facility-Administered Medications  Medication Dose Route Frequency Provider Last Rate Last Admin   0.9 %  sodium chloride  infusion  500 mL Intravenous Once Cirigliano, Vito V, DO         Past Medical History:  Diagnosis Date   Allergy    Anxiety    Aortic stenosis    s/p AVR w/ 23 mm Edward Magna-ease Pericardial Valve   Arthritis    hands & knee- L    Coronary artery disease    Depression    Dysrhythmia    when taking antihistamines   Heart murmur    History of echocardiogram    Echo (8/15): EF 55-60%, normal wall motion, grade 1 diastolic dysfunction, AVR okay (mean 10 mm Hg), normal RV function, moderate TR   Osteoporosis    Palpitations    Unspecified hypothyroidism    Vertigo     Past Surgical History:  Procedure Laterality Date   ABDOMINAL HYSTERECTOMY  AORTIC VALVE REPLACEMENT N/A 09/24/2013   Procedure: AORTIC VALVE REPLACEMENT (AVR) using Pericardial Tissue Valve (size 23mm).;  Surgeon: Dorise MARLA Fellers, MD;  Location: [facility] OR;  Service: Open Heart Surgery;  Laterality: N/A;   APPENDECTOMY     at time of hysterectomy   bilateral knee surgery  35 yrs. ago   cartilage removed from knees   CARDIAC CATHETERIZATION     CARDIAC VALVE REPLACEMENT     CORONARY ARTERY BYPASS GRAFT N/A 09/24/2013   Procedure: CORONARY ARTERY BYPASS GRAFTING (CABG) x4: LIMA to LAD, SVG to Diagonal 1, SVG to Diagonal 2, Svg to OM 1.;  Surgeon: Dorise MARLA Fellers, MD;  Location: [facility] OR;  Service: Open Heart Surgery;  Laterality: N/A;   FINGER SURGERY     left hand    INJECTION KNEE Left 03/04/2013   Procedure: left KNEE cortisone INJECTION;  Surgeon: Dempsey LULLA Moan, MD;  Location: [facility];  Service: Orthopedics;  Laterality: Left;   INTRAOPERATIVE TRANSESOPHAGEAL ECHOCARDIOGRAM N/A 09/24/2013   Procedure: INTRAOPERATIVE TRANSESOPHAGEAL ECHOCARDIOGRAM;  Surgeon: Dorise MARLA Fellers, MD;  Location: [facility];  Service: Open Heart Surgery;  Laterality: N/A;   LEFT AND RIGHT HEART CATHETERIZATION WITH CORONARY ANGIOGRAM N/A 09/17/2013   Procedure: LEFT AND RIGHT HEART CATHETERIZATION WITH CORONARY ANGIOGRAM;  Surgeon: Ozell JONETTA Fell, MD;  Location: [facility] CATH [facility];  Service: Cardiovascular;  Laterality: N/A;   MASS EXCISION Right 05/17/2018   Procedure: EXCISION CYST RIGHT RING FINGER, DEBRIDEMENT DISTAL INTERPHALANGEAL RIGHT FINGER;  Surgeon: Murrell Kuba, MD;  Location: [facility];  Service: Orthopedics;  Laterality: Right;   MASS EXCISION Right 07/14/2020   Procedure: EXCISION CYST, DEBRIDEMENT DISTAL INTERPHALANGEAL JOINT RIGHT INDEX FINGER;  Surgeon: Murrell Kuba, MD;  Location: [facility];  Service: Orthopedics;  Laterality: Right;  IV REGIONAL FOREARM BLOCK; 60 MINUTES   TONSILLECTOMY     TOTAL KNEE ARTHROPLASTY Right 03/04/2013   Procedure: RIGHT TOTAL KNEE ARTHROPLASTY;  Surgeon: Dempsey LULLA Moan, MD;  Location: [facility];  Service: Orthopedics;  Laterality: Right;    Social History   Socioeconomic History   Marital status: Married    Spouse name: Not on file   Number of children: 3   Years of education: Not on file   Highest education level: Not on file  Occupational History   Occupation: retired  Tobacco Use   Smoking status: Never   Smokeless tobacco: Never  Vaping Use   Vaping status: Never Used  Substance and Sexual Activity   Alcohol  use: Yes    Alcohol /week: 1.0 standard drink of alcohol     Types: 1 Glasses of wine per week    Comment: 4 oz red wine periodically   Drug use: No   Sexual activity: Not Currently    Birth  control/protection: Post-menopausal, Surgical  Other Topics Concern   Not on file  Social History Narrative   Not on file   Social Drivers of Health   Financial Resource Strain: Not on file  Food Insecurity: Not on file  Transportation Needs: Not on file  Physical Activity: Not on file  Stress: Not on file  Social Connections: Not on file  Intimate Partner Violence: Not on file    Family History  Problem Relation Age of Onset   Heart disease Mother    Heart failure Mother    Esophageal cancer Father    Heart disease Father    Heart disease Sister    Hyperlipidemia Sister    Heart disease Sister  Heart disease Brother    ALS Brother     ROS: no fevers or chills, productive cough, hemoptysis, dysphasia, odynophagia, melena, hematochezia, dysuria, hematuria, rash, seizure activity, orthopnea, PND, pedal edema, claudication. Remaining systems are negative.  Physical Exam: Well-developed well-nourished in no acute distress.  Skin is warm and dry.  HEENT is normal.  Neck is supple.  Chest is clear to auscultation with normal expansion.  Cardiovascular exam is regular rate and rhythm.  Abdominal exam nontender or distended. No masses palpated. Extremities show no edema. neuro grossly intact  EKG Interpretation Date/Time:  Monday November 13 2023 10:45:37 EDT Ventricular Rate:  74 PR Interval:  124 QRS Duration:  102 QT Interval:  402 QTC Calculation: 446 R Axis:   -50  Text Interpretation: Normal sinus rhythm Left axis deviation Confirmed by Pietro Rogue ([id number]) on 11/13/2023 10:47:00 AM    A/P  1 coronary artery disease status post coronary bypass and graft-patient doing well with no chest pain.  Continue medical therapy with aspirin  and statin.  2 hyperlipidemia-continue lovastatin ; check lipids.  Recent liver functions normal.  3 history of aortic valve replacement-continue SBE prophylaxis.  Most recent echocardiogram showed normally functioning valve.  4  palpitations-continue Toprol .  Symptoms are controlled.  Rogue Pietro, MD

## 2023-11-07 ENCOUNTER — Ambulatory Visit (HOSPITAL_COMMUNITY)
Admission: RE | Admit: 2023-11-07 | Discharge: 2023-11-07 | Disposition: A | Discharge status: Home / Self Care | Source: Ambulatory Visit | Attending: Physician Assistant | Admitting: Physician Assistant

## 2023-11-07 DIAGNOSIS — N200 Calculus of kidney: Secondary | ICD-10-CM | POA: Diagnosis not present

## 2023-11-07 DIAGNOSIS — R1031 Right lower quadrant pain: Secondary | ICD-10-CM | POA: Insufficient documentation

## 2023-11-07 DIAGNOSIS — R109 Unspecified abdominal pain: Secondary | ICD-10-CM | POA: Diagnosis not present

## 2023-11-07 DIAGNOSIS — N281 Cyst of kidney, acquired: Secondary | ICD-10-CM | POA: Diagnosis not present

## 2023-11-07 DIAGNOSIS — K5730 Diverticulosis of large intestine without perforation or abscess without bleeding: Secondary | ICD-10-CM | POA: Diagnosis not present

## 2023-11-07 MED ORDER — IOHEXOL 300 MG/ML  SOLN
75.0000 mL | Freq: Once | INTRAMUSCULAR | Status: AC | PRN
  Administered 2023-11-07: 75 mL via INTRAVENOUS

## 2023-11-08 ENCOUNTER — Ambulatory Visit: Payer: Self-pay | Admitting: Physician Assistant

## 2023-11-08 ENCOUNTER — Telehealth: Payer: Self-pay | Admitting: Physician Assistant

## 2023-11-08 NOTE — Telephone Encounter (Signed)
 PT is calling to find out the results of CT on yesterday. Please advise.

## 2023-11-09 NOTE — Telephone Encounter (Signed)
 Patient calling in regards to previous note. Please advise.   Thank you

## 2023-11-09 NOTE — Telephone Encounter (Signed)
 Discussed labs & CT results and recommendations with patient. She had no further questions at end of call.

## 2023-11-13 ENCOUNTER — Encounter: Payer: Self-pay | Admitting: Cardiology

## 2023-11-13 ENCOUNTER — Ambulatory Visit: Attending: Cardiology | Admitting: Cardiology

## 2023-11-13 VITALS — BP 140/90 | HR 74 | Ht 63.75 in | Wt 106.8 lb

## 2023-11-13 DIAGNOSIS — E7800 Pure hypercholesterolemia, unspecified: Secondary | ICD-10-CM | POA: Diagnosis not present

## 2023-11-13 DIAGNOSIS — I2583 Coronary atherosclerosis due to lipid rich plaque: Secondary | ICD-10-CM | POA: Diagnosis not present

## 2023-11-13 DIAGNOSIS — I2510 Atherosclerotic heart disease of native coronary artery without angina pectoris: Secondary | ICD-10-CM | POA: Diagnosis not present

## 2023-11-13 DIAGNOSIS — Z953 Presence of xenogenic heart valve: Secondary | ICD-10-CM | POA: Diagnosis not present

## 2023-11-13 DIAGNOSIS — I359 Nonrheumatic aortic valve disorder, unspecified: Secondary | ICD-10-CM | POA: Diagnosis not present

## 2023-11-13 DIAGNOSIS — R002 Palpitations: Secondary | ICD-10-CM

## 2023-11-13 NOTE — Patient Instructions (Signed)

## 2023-11-14 ENCOUNTER — Ambulatory Visit: Payer: Self-pay | Admitting: Cardiology

## 2023-11-14 LAB — LIPID PANEL
Chol/HDL Ratio: 2.1 ratio (ref 0.0–4.4)
Cholesterol, Total: 163 mg/dL (ref 100–199)
HDL: 76 mg/dL (ref >39)
LDL Chol Calc (NIH): 74 mg/dL (ref 0–99)
Triglycerides: 68 mg/dL (ref 0–149)
VLDL Cholesterol Cal: 13 mg/dL (ref 5–40)

## 2023-12-06 ENCOUNTER — Ambulatory Visit: Admitting: Physician Assistant

## 2023-12-12 DIAGNOSIS — Z85828 Personal history of other malignant neoplasm of skin: Secondary | ICD-10-CM | POA: Diagnosis not present

## 2023-12-12 DIAGNOSIS — H61001 Unspecified perichondritis of right external ear: Secondary | ICD-10-CM | POA: Diagnosis not present

## 2023-12-15 ENCOUNTER — Telehealth: Payer: Self-pay | Admitting: Cardiology

## 2023-12-15 NOTE — Telephone Encounter (Signed)
 Called patient. Verified name and DOB. Patient asked if she could have a COVID shot. Patient denies feeling current cardiac symptoms. Patient just wanted to touch base with cardiologist about vaccine safety. I advised patient vaccines have possible side effects and are normally safe to take but will forward concern to Dr. Pietro and pharmacy to advise. Patient verbalized understanding.  Josie RN

## 2023-12-15 NOTE — Telephone Encounter (Signed)
 Pt would like to know if its safe for her to take the new dosage shot for COVID

## 2023-12-15 NOTE — Telephone Encounter (Signed)
Pt contacted and advised.

## 2023-12-23 ENCOUNTER — Other Ambulatory Visit: Payer: Self-pay | Admitting: Physician Assistant

## 2024-01-11 DIAGNOSIS — H5000 Unspecified esotropia: Secondary | ICD-10-CM | POA: Diagnosis not present

## 2024-02-16 DIAGNOSIS — I10 Essential (primary) hypertension: Secondary | ICD-10-CM | POA: Diagnosis not present

## 2024-02-16 DIAGNOSIS — E7800 Pure hypercholesterolemia, unspecified: Secondary | ICD-10-CM | POA: Diagnosis not present

## 2024-02-16 DIAGNOSIS — E039 Hypothyroidism, unspecified: Secondary | ICD-10-CM | POA: Diagnosis not present

## 2024-02-16 DIAGNOSIS — M8580 Other specified disorders of bone density and structure, unspecified site: Secondary | ICD-10-CM | POA: Diagnosis not present

## 2024-02-21 DIAGNOSIS — I350 Nonrheumatic aortic (valve) stenosis: Secondary | ICD-10-CM | POA: Diagnosis not present

## 2024-02-21 DIAGNOSIS — Z952 Presence of prosthetic heart valve: Secondary | ICD-10-CM | POA: Diagnosis not present

## 2024-02-21 DIAGNOSIS — Z1331 Encounter for screening for depression: Secondary | ICD-10-CM | POA: Diagnosis not present

## 2024-02-21 DIAGNOSIS — M1990 Unspecified osteoarthritis, unspecified site: Secondary | ICD-10-CM | POA: Diagnosis not present

## 2024-02-21 DIAGNOSIS — F419 Anxiety disorder, unspecified: Secondary | ICD-10-CM | POA: Diagnosis not present

## 2024-02-21 DIAGNOSIS — E039 Hypothyroidism, unspecified: Secondary | ICD-10-CM | POA: Diagnosis not present

## 2024-02-21 DIAGNOSIS — I10 Essential (primary) hypertension: Secondary | ICD-10-CM | POA: Diagnosis not present

## 2024-02-21 DIAGNOSIS — Z1339 Encounter for screening examination for other mental health and behavioral disorders: Secondary | ICD-10-CM | POA: Diagnosis not present

## 2024-02-21 DIAGNOSIS — Z515 Encounter for palliative care: Secondary | ICD-10-CM | POA: Diagnosis not present

## 2024-02-21 DIAGNOSIS — Z0000 Encounter for general adult medical examination without abnormal findings: Secondary | ICD-10-CM | POA: Diagnosis not present

## 2024-02-21 DIAGNOSIS — E7800 Pure hypercholesterolemia, unspecified: Secondary | ICD-10-CM | POA: Diagnosis not present

## 2024-03-05 ENCOUNTER — Other Ambulatory Visit: Payer: Self-pay | Admitting: Cardiology

## 2024-03-05 DIAGNOSIS — R002 Palpitations: Secondary | ICD-10-CM

## 2024-05-02 ENCOUNTER — Encounter: Payer: Self-pay | Admitting: Cardiology
